# Patient Record
Sex: Male | Born: 1955 | Race: White | Hispanic: No | Marital: Married | Smoking: Never smoker
Health system: Southern US, Community
[De-identification: ages and names within clinical notes are randomized; demographics above are authoritative.]

## PROBLEM LIST (undated history)

## (undated) DIAGNOSIS — Z8679 Personal history of other diseases of the circulatory system: Secondary | ICD-10-CM

## (undated) DIAGNOSIS — G4730 Sleep apnea, unspecified: Secondary | ICD-10-CM

## (undated) DIAGNOSIS — IMO0001 Reserved for inherently not codable concepts without codable children: Secondary | ICD-10-CM

## (undated) DIAGNOSIS — I499 Cardiac arrhythmia, unspecified: Secondary | ICD-10-CM

## (undated) DIAGNOSIS — J309 Allergic rhinitis, unspecified: Secondary | ICD-10-CM

## (undated) DIAGNOSIS — I428 Other cardiomyopathies: Secondary | ICD-10-CM

## (undated) DIAGNOSIS — I4892 Unspecified atrial flutter: Secondary | ICD-10-CM

## (undated) DIAGNOSIS — I4819 Other persistent atrial fibrillation: Secondary | ICD-10-CM

## (undated) DIAGNOSIS — E669 Obesity, unspecified: Secondary | ICD-10-CM

## (undated) DIAGNOSIS — I5032 Chronic diastolic (congestive) heart failure: Secondary | ICD-10-CM

## (undated) DIAGNOSIS — I2510 Atherosclerotic heart disease of native coronary artery without angina pectoris: Secondary | ICD-10-CM

## (undated) DIAGNOSIS — Z9889 Other specified postprocedural states: Secondary | ICD-10-CM

## (undated) DIAGNOSIS — I10 Essential (primary) hypertension: Secondary | ICD-10-CM

## (undated) DIAGNOSIS — M1990 Unspecified osteoarthritis, unspecified site: Secondary | ICD-10-CM

## (undated) DIAGNOSIS — I484 Atypical atrial flutter: Secondary | ICD-10-CM

## (undated) DIAGNOSIS — I00 Rheumatic fever without heart involvement: Secondary | ICD-10-CM

## (undated) HISTORY — DX: Other persistent atrial fibrillation: I48.19

## (undated) HISTORY — DX: Other cardiomyopathies: I42.8

## (undated) HISTORY — DX: Obesity, unspecified: E66.9

## (undated) HISTORY — DX: Rheumatic fever without heart involvement: I00

## (undated) HISTORY — DX: Atherosclerotic heart disease of native coronary artery without angina pectoris: I25.10

## (undated) HISTORY — DX: Sleep apnea, unspecified: G47.30

## (undated) HISTORY — DX: Essential (primary) hypertension: I10

## (undated) HISTORY — DX: Atypical atrial flutter: I48.4

## (undated) HISTORY — PX: JOINT REPLACEMENT: SHX530

## (undated) HISTORY — DX: Unspecified atrial flutter: I48.92

## (undated) HISTORY — PX: TONSILLECTOMY: SUR1361

## (undated) HISTORY — DX: Allergic rhinitis, unspecified: J30.9

---

## 2001-05-29 ENCOUNTER — Inpatient Hospital Stay (HOSPITAL_COMMUNITY): Admission: AD | Admit: 2001-05-29 | Discharge: 2001-06-01 | Payer: Self-pay | Admitting: Internal Medicine

## 2001-05-29 ENCOUNTER — Encounter: Payer: Self-pay | Admitting: Internal Medicine

## 2001-05-31 ENCOUNTER — Encounter: Payer: Self-pay | Admitting: Cardiology

## 2001-08-29 ENCOUNTER — Ambulatory Visit (HOSPITAL_COMMUNITY): Admission: RE | Admit: 2001-08-29 | Discharge: 2001-08-29 | Payer: Self-pay | Admitting: Cardiology

## 2001-10-30 ENCOUNTER — Inpatient Hospital Stay (HOSPITAL_COMMUNITY): Admission: AD | Admit: 2001-10-30 | Discharge: 2001-11-02 | Payer: Self-pay | Admitting: Cardiology

## 2004-01-06 ENCOUNTER — Ambulatory Visit: Payer: Self-pay | Admitting: Cardiology

## 2004-01-15 ENCOUNTER — Ambulatory Visit: Payer: Self-pay

## 2004-01-15 ENCOUNTER — Ambulatory Visit: Payer: Self-pay | Admitting: Internal Medicine

## 2004-01-19 ENCOUNTER — Ambulatory Visit (HOSPITAL_BASED_OUTPATIENT_CLINIC_OR_DEPARTMENT_OTHER): Admission: RE | Admit: 2004-01-19 | Discharge: 2004-01-19 | Payer: Self-pay | Admitting: Cardiology

## 2004-01-19 ENCOUNTER — Ambulatory Visit: Payer: Self-pay | Admitting: Pulmonary Disease

## 2004-02-02 ENCOUNTER — Ambulatory Visit: Payer: Self-pay | Admitting: Cardiology

## 2004-02-16 ENCOUNTER — Ambulatory Visit: Payer: Self-pay | Admitting: Cardiology

## 2004-02-25 ENCOUNTER — Ambulatory Visit (HOSPITAL_COMMUNITY): Admission: RE | Admit: 2004-02-25 | Discharge: 2004-02-25 | Payer: Self-pay | Admitting: Cardiovascular Disease

## 2004-02-29 HISTORY — PX: ATRIAL ABLATION SURGERY: SHX560

## 2004-03-09 ENCOUNTER — Ambulatory Visit: Payer: Self-pay | Admitting: Cardiology

## 2004-03-19 ENCOUNTER — Ambulatory Visit: Payer: Self-pay | Admitting: Cardiology

## 2004-03-25 ENCOUNTER — Ambulatory Visit (HOSPITAL_COMMUNITY): Admission: RE | Admit: 2004-03-25 | Discharge: 2004-03-25 | Payer: Self-pay | Admitting: Cardiology

## 2004-04-01 ENCOUNTER — Ambulatory Visit: Payer: Self-pay | Admitting: Cardiology

## 2004-04-05 ENCOUNTER — Ambulatory Visit: Payer: Self-pay | Admitting: Cardiology

## 2004-04-13 ENCOUNTER — Ambulatory Visit: Payer: Self-pay

## 2004-04-26 ENCOUNTER — Ambulatory Visit: Payer: Self-pay | Admitting: Cardiology

## 2004-05-03 ENCOUNTER — Ambulatory Visit: Payer: Self-pay | Admitting: Cardiology

## 2004-05-03 ENCOUNTER — Emergency Department (HOSPITAL_COMMUNITY): Admission: EM | Admit: 2004-05-03 | Discharge: 2004-05-03 | Payer: Self-pay | Admitting: Emergency Medicine

## 2004-05-13 ENCOUNTER — Inpatient Hospital Stay (HOSPITAL_COMMUNITY): Admission: AD | Admit: 2004-05-13 | Discharge: 2004-05-14 | Payer: Self-pay | Admitting: Cardiology

## 2004-05-13 ENCOUNTER — Ambulatory Visit: Payer: Self-pay | Admitting: Cardiology

## 2004-05-19 ENCOUNTER — Ambulatory Visit: Payer: Self-pay | Admitting: Cardiology

## 2004-05-19 ENCOUNTER — Ambulatory Visit (HOSPITAL_COMMUNITY): Admission: RE | Admit: 2004-05-19 | Discharge: 2004-05-20 | Payer: Self-pay | Admitting: Internal Medicine

## 2004-06-15 ENCOUNTER — Ambulatory Visit: Payer: Self-pay | Admitting: Cardiology

## 2004-06-22 ENCOUNTER — Ambulatory Visit: Payer: Self-pay | Admitting: Internal Medicine

## 2004-07-27 ENCOUNTER — Ambulatory Visit: Payer: Self-pay | Admitting: Cardiology

## 2004-08-10 ENCOUNTER — Ambulatory Visit: Payer: Self-pay | Admitting: Internal Medicine

## 2004-09-10 ENCOUNTER — Ambulatory Visit: Payer: Self-pay | Admitting: Internal Medicine

## 2004-10-21 ENCOUNTER — Ambulatory Visit: Payer: Self-pay | Admitting: Cardiology

## 2004-11-18 ENCOUNTER — Ambulatory Visit: Payer: Self-pay | Admitting: Cardiology

## 2004-12-16 ENCOUNTER — Ambulatory Visit: Payer: Self-pay | Admitting: Internal Medicine

## 2004-12-16 ENCOUNTER — Ambulatory Visit: Payer: Self-pay | Admitting: Cardiology

## 2004-12-17 ENCOUNTER — Ambulatory Visit: Payer: Self-pay | Admitting: Family Medicine

## 2005-01-13 ENCOUNTER — Ambulatory Visit: Payer: Self-pay | Admitting: Cardiology

## 2005-02-16 ENCOUNTER — Ambulatory Visit: Payer: Self-pay | Admitting: Internal Medicine

## 2005-03-14 ENCOUNTER — Ambulatory Visit: Payer: Self-pay | Admitting: Cardiology

## 2005-04-11 ENCOUNTER — Ambulatory Visit: Payer: Self-pay | Admitting: Internal Medicine

## 2005-05-10 ENCOUNTER — Ambulatory Visit: Payer: Self-pay | Admitting: Cardiology

## 2005-06-21 ENCOUNTER — Ambulatory Visit: Payer: Self-pay | Admitting: Family Medicine

## 2005-08-02 ENCOUNTER — Ambulatory Visit: Payer: Self-pay | Admitting: Family Medicine

## 2005-08-30 ENCOUNTER — Ambulatory Visit: Payer: Self-pay | Admitting: Internal Medicine

## 2005-09-09 ENCOUNTER — Ambulatory Visit: Payer: Self-pay | Admitting: Internal Medicine

## 2005-09-13 ENCOUNTER — Ambulatory Visit: Payer: Self-pay | Admitting: Family Medicine

## 2005-09-14 ENCOUNTER — Ambulatory Visit: Payer: Self-pay | Admitting: *Deleted

## 2005-10-12 ENCOUNTER — Ambulatory Visit: Payer: Self-pay | Admitting: Internal Medicine

## 2005-10-19 ENCOUNTER — Ambulatory Visit: Payer: Self-pay | Admitting: Internal Medicine

## 2005-10-21 ENCOUNTER — Ambulatory Visit: Payer: Self-pay | Admitting: Internal Medicine

## 2005-11-24 ENCOUNTER — Ambulatory Visit: Payer: Self-pay | Admitting: Internal Medicine

## 2006-01-17 ENCOUNTER — Ambulatory Visit: Payer: Self-pay | Admitting: Internal Medicine

## 2006-02-10 ENCOUNTER — Ambulatory Visit: Payer: Self-pay | Admitting: Internal Medicine

## 2006-03-01 ENCOUNTER — Ambulatory Visit: Payer: Self-pay | Admitting: Internal Medicine

## 2006-03-17 ENCOUNTER — Ambulatory Visit: Payer: Self-pay | Admitting: Internal Medicine

## 2006-05-16 ENCOUNTER — Ambulatory Visit: Payer: Self-pay | Admitting: Internal Medicine

## 2006-05-22 ENCOUNTER — Ambulatory Visit: Payer: Self-pay | Admitting: Internal Medicine

## 2006-05-22 ENCOUNTER — Inpatient Hospital Stay (HOSPITAL_COMMUNITY): Admission: EM | Admit: 2006-05-22 | Discharge: 2006-05-26 | Payer: Self-pay | Admitting: *Deleted

## 2006-05-22 ENCOUNTER — Encounter (INDEPENDENT_AMBULATORY_CARE_PROVIDER_SITE_OTHER): Payer: Self-pay | Admitting: Interventional Cardiology

## 2006-05-30 ENCOUNTER — Encounter: Payer: Self-pay | Admitting: Internal Medicine

## 2006-05-30 ENCOUNTER — Ambulatory Visit: Payer: Self-pay | Admitting: Internal Medicine

## 2006-05-30 ENCOUNTER — Ambulatory Visit: Payer: Self-pay | Admitting: *Deleted

## 2006-05-30 DIAGNOSIS — Z8701 Personal history of pneumonia (recurrent): Secondary | ICD-10-CM | POA: Insufficient documentation

## 2006-05-30 DIAGNOSIS — R109 Unspecified abdominal pain: Secondary | ICD-10-CM

## 2006-05-30 DIAGNOSIS — R197 Diarrhea, unspecified: Secondary | ICD-10-CM | POA: Insufficient documentation

## 2006-05-30 LAB — CONVERTED CEMR LAB
Hemoglobin: 13.6 g/dL
INR: 5
Prothrombin Time: 27.2 s
Urobilinogen, UA: 0.2
WBC Urine, dipstick: NEGATIVE
pH: 5

## 2006-06-02 ENCOUNTER — Ambulatory Visit: Payer: Self-pay | Admitting: Internal Medicine

## 2006-06-02 LAB — CONVERTED CEMR LAB: INR: 1.5

## 2006-06-16 ENCOUNTER — Ambulatory Visit: Payer: Self-pay | Admitting: Internal Medicine

## 2006-06-16 LAB — CONVERTED CEMR LAB: Prothrombin Time: 18.3 s

## 2006-06-20 DIAGNOSIS — Z9089 Acquired absence of other organs: Secondary | ICD-10-CM | POA: Insufficient documentation

## 2006-06-20 DIAGNOSIS — Z8679 Personal history of other diseases of the circulatory system: Secondary | ICD-10-CM

## 2006-06-20 DIAGNOSIS — Z9889 Other specified postprocedural states: Secondary | ICD-10-CM

## 2006-06-20 DIAGNOSIS — I4891 Unspecified atrial fibrillation: Secondary | ICD-10-CM | POA: Insufficient documentation

## 2006-06-20 DIAGNOSIS — J309 Allergic rhinitis, unspecified: Secondary | ICD-10-CM | POA: Insufficient documentation

## 2006-06-20 DIAGNOSIS — Z9189 Other specified personal risk factors, not elsewhere classified: Secondary | ICD-10-CM | POA: Insufficient documentation

## 2006-07-05 ENCOUNTER — Ambulatory Visit: Payer: Self-pay | Admitting: Internal Medicine

## 2006-08-11 ENCOUNTER — Encounter: Admission: RE | Admit: 2006-08-11 | Discharge: 2006-08-11 | Payer: Self-pay | Admitting: Internal Medicine

## 2006-08-11 ENCOUNTER — Ambulatory Visit: Payer: Self-pay | Admitting: Internal Medicine

## 2006-08-25 ENCOUNTER — Ambulatory Visit: Payer: Self-pay | Admitting: Internal Medicine

## 2006-09-04 ENCOUNTER — Ambulatory Visit: Payer: Self-pay | Admitting: Internal Medicine

## 2006-10-09 ENCOUNTER — Ambulatory Visit: Payer: Self-pay | Admitting: Internal Medicine

## 2006-10-09 LAB — CONVERTED CEMR LAB
INR: 2
Prothrombin Time: 17.3 s

## 2006-12-07 ENCOUNTER — Ambulatory Visit: Payer: Self-pay | Admitting: Internal Medicine

## 2006-12-07 LAB — CONVERTED CEMR LAB: INR: 2.4

## 2007-01-22 ENCOUNTER — Ambulatory Visit: Payer: Self-pay | Admitting: Internal Medicine

## 2007-03-16 ENCOUNTER — Ambulatory Visit: Payer: Self-pay | Admitting: Internal Medicine

## 2007-03-16 LAB — CONVERTED CEMR LAB: INR: 2.1

## 2007-04-19 ENCOUNTER — Ambulatory Visit: Payer: Self-pay | Admitting: Internal Medicine

## 2007-04-19 LAB — CONVERTED CEMR LAB
Albumin: 3.6 g/dL (ref 3.5–5.2)
Bilirubin, Direct: 0.1 mg/dL (ref 0.0–0.3)
TSH: 2.94 microintl units/mL (ref 0.35–5.50)
Total Bilirubin: 1 mg/dL (ref 0.3–1.2)

## 2007-06-01 ENCOUNTER — Ambulatory Visit: Payer: Self-pay | Admitting: Internal Medicine

## 2007-06-01 LAB — CONVERTED CEMR LAB: INR: 2.2

## 2007-09-14 ENCOUNTER — Ambulatory Visit: Payer: Self-pay | Admitting: Internal Medicine

## 2007-09-14 LAB — CONVERTED CEMR LAB: INR: 3.4

## 2007-12-10 ENCOUNTER — Ambulatory Visit: Payer: Self-pay | Admitting: Internal Medicine

## 2007-12-10 LAB — CONVERTED CEMR LAB: Prothrombin Time: 13.6 s

## 2007-12-26 ENCOUNTER — Telehealth (INDEPENDENT_AMBULATORY_CARE_PROVIDER_SITE_OTHER): Payer: Self-pay | Admitting: *Deleted

## 2007-12-28 ENCOUNTER — Encounter: Payer: Self-pay | Admitting: Internal Medicine

## 2008-01-01 ENCOUNTER — Ambulatory Visit: Payer: Self-pay | Admitting: Internal Medicine

## 2008-01-01 LAB — CONVERTED CEMR LAB: Prothrombin Time: 19.5 s

## 2008-03-05 ENCOUNTER — Ambulatory Visit: Payer: Self-pay | Admitting: Internal Medicine

## 2008-03-05 LAB — CONVERTED CEMR LAB
Alkaline Phosphatase: 61 units/L (ref 39–117)
Bilirubin, Direct: 0.1 mg/dL (ref 0.0–0.3)
Prothrombin Time: 22.3 s — ABNORMAL HIGH (ref 10.9–13.3)
Total Bilirubin: 1.2 mg/dL (ref 0.3–1.2)
Total Protein: 6.6 g/dL (ref 6.0–8.3)

## 2008-04-14 ENCOUNTER — Ambulatory Visit: Payer: Self-pay | Admitting: Internal Medicine

## 2008-04-14 LAB — CONVERTED CEMR LAB
INR: 1.9
Prothrombin Time: 17.1 s

## 2008-07-04 ENCOUNTER — Ambulatory Visit: Payer: Self-pay | Admitting: Internal Medicine

## 2008-07-04 LAB — CONVERTED CEMR LAB: Prothrombin Time: 19.5 s

## 2008-09-24 ENCOUNTER — Ambulatory Visit: Payer: Self-pay | Admitting: Internal Medicine

## 2008-09-24 LAB — CONVERTED CEMR LAB
INR: 2.5
Prothrombin Time: 19.3 s

## 2008-12-18 ENCOUNTER — Ambulatory Visit: Payer: Self-pay | Admitting: Family Medicine

## 2008-12-30 ENCOUNTER — Encounter (INDEPENDENT_AMBULATORY_CARE_PROVIDER_SITE_OTHER): Payer: Self-pay | Admitting: *Deleted

## 2009-01-16 ENCOUNTER — Ambulatory Visit: Payer: Self-pay | Admitting: Internal Medicine

## 2009-01-16 ENCOUNTER — Telehealth (INDEPENDENT_AMBULATORY_CARE_PROVIDER_SITE_OTHER): Payer: Self-pay | Admitting: *Deleted

## 2009-01-16 LAB — CONVERTED CEMR LAB: INR: 3

## 2009-02-23 ENCOUNTER — Ambulatory Visit: Payer: Self-pay | Admitting: Internal Medicine

## 2009-02-23 LAB — CONVERTED CEMR LAB: Prothrombin Time: 14.2 s

## 2009-03-10 ENCOUNTER — Ambulatory Visit: Payer: Self-pay | Admitting: Family Medicine

## 2009-03-10 LAB — CONVERTED CEMR LAB
INR: 2.4
Prothrombin Time: 19 s

## 2009-03-11 ENCOUNTER — Telehealth: Payer: Self-pay | Admitting: Internal Medicine

## 2009-03-23 ENCOUNTER — Ambulatory Visit: Payer: Self-pay | Admitting: Internal Medicine

## 2009-03-23 DIAGNOSIS — I428 Other cardiomyopathies: Secondary | ICD-10-CM | POA: Insufficient documentation

## 2009-03-24 ENCOUNTER — Ambulatory Visit (HOSPITAL_COMMUNITY): Admission: RE | Admit: 2009-03-24 | Discharge: 2009-03-24 | Payer: Self-pay | Admitting: Internal Medicine

## 2009-03-24 ENCOUNTER — Ambulatory Visit: Payer: Self-pay

## 2009-03-24 ENCOUNTER — Ambulatory Visit: Payer: Self-pay | Admitting: Cardiovascular Disease

## 2009-03-24 ENCOUNTER — Encounter: Payer: Self-pay | Admitting: Internal Medicine

## 2009-03-31 ENCOUNTER — Telehealth (INDEPENDENT_AMBULATORY_CARE_PROVIDER_SITE_OTHER): Payer: Self-pay | Admitting: *Deleted

## 2009-04-08 ENCOUNTER — Encounter (INDEPENDENT_AMBULATORY_CARE_PROVIDER_SITE_OTHER): Payer: Self-pay | Admitting: *Deleted

## 2009-05-06 ENCOUNTER — Ambulatory Visit: Payer: Self-pay | Admitting: Internal Medicine

## 2009-05-06 ENCOUNTER — Telehealth (INDEPENDENT_AMBULATORY_CARE_PROVIDER_SITE_OTHER): Payer: Self-pay | Admitting: *Deleted

## 2009-05-06 LAB — CONVERTED CEMR LAB: INR: 2.7

## 2009-05-12 ENCOUNTER — Encounter (INDEPENDENT_AMBULATORY_CARE_PROVIDER_SITE_OTHER): Payer: Self-pay | Admitting: *Deleted

## 2009-07-10 ENCOUNTER — Ambulatory Visit: Payer: Self-pay | Admitting: Internal Medicine

## 2009-07-10 LAB — CONVERTED CEMR LAB: INR: 3.2

## 2009-07-28 ENCOUNTER — Telehealth (INDEPENDENT_AMBULATORY_CARE_PROVIDER_SITE_OTHER): Payer: Self-pay | Admitting: *Deleted

## 2009-07-31 ENCOUNTER — Ambulatory Visit: Payer: Self-pay | Admitting: Internal Medicine

## 2009-09-02 ENCOUNTER — Telehealth (INDEPENDENT_AMBULATORY_CARE_PROVIDER_SITE_OTHER): Payer: Self-pay | Admitting: *Deleted

## 2009-09-09 ENCOUNTER — Encounter (INDEPENDENT_AMBULATORY_CARE_PROVIDER_SITE_OTHER): Payer: Self-pay | Admitting: *Deleted

## 2009-10-09 ENCOUNTER — Ambulatory Visit: Payer: Self-pay | Admitting: Internal Medicine

## 2009-11-09 ENCOUNTER — Telehealth (INDEPENDENT_AMBULATORY_CARE_PROVIDER_SITE_OTHER): Payer: Self-pay | Admitting: *Deleted

## 2009-11-11 ENCOUNTER — Ambulatory Visit: Payer: Self-pay | Admitting: Internal Medicine

## 2009-11-11 LAB — CONVERTED CEMR LAB: INR: 2.6

## 2009-12-11 ENCOUNTER — Telehealth: Payer: Self-pay | Admitting: Internal Medicine

## 2009-12-18 ENCOUNTER — Ambulatory Visit: Payer: Self-pay | Admitting: Internal Medicine

## 2009-12-23 ENCOUNTER — Ambulatory Visit: Payer: Self-pay | Admitting: Internal Medicine

## 2009-12-25 ENCOUNTER — Ambulatory Visit: Payer: Self-pay | Admitting: Internal Medicine

## 2009-12-25 ENCOUNTER — Telehealth (INDEPENDENT_AMBULATORY_CARE_PROVIDER_SITE_OTHER): Payer: Self-pay | Admitting: *Deleted

## 2009-12-28 ENCOUNTER — Telehealth: Payer: Self-pay | Admitting: Internal Medicine

## 2010-01-04 ENCOUNTER — Ambulatory Visit: Payer: Self-pay | Admitting: Internal Medicine

## 2010-01-08 ENCOUNTER — Ambulatory Visit: Payer: Self-pay | Admitting: Internal Medicine

## 2010-01-15 ENCOUNTER — Ambulatory Visit: Payer: Self-pay | Admitting: Internal Medicine

## 2010-01-15 LAB — CONVERTED CEMR LAB: INR: 2.9

## 2010-01-20 ENCOUNTER — Ambulatory Visit: Payer: Self-pay | Admitting: Internal Medicine

## 2010-01-29 ENCOUNTER — Ambulatory Visit: Payer: Self-pay | Admitting: Internal Medicine

## 2010-02-05 ENCOUNTER — Ambulatory Visit: Payer: Self-pay | Admitting: Internal Medicine

## 2010-02-05 ENCOUNTER — Encounter: Payer: Self-pay | Admitting: Internal Medicine

## 2010-02-05 LAB — CONVERTED CEMR LAB
Basophils Absolute: 0 10*3/uL (ref 0.0–0.1)
Basophils Relative: 1 % (ref 0–1)
CO2: 27 meq/L (ref 19–32)
Calcium: 8.7 mg/dL (ref 8.4–10.5)
Creatinine, Ser: 1.28 mg/dL (ref 0.40–1.50)
MCHC: 34 g/dL (ref 30.0–36.0)
Monocytes Absolute: 0.9 10*3/uL (ref 0.1–1.0)
Neutro Abs: 5.2 10*3/uL (ref 1.7–7.7)
Neutrophils Relative %: 64 % (ref 43–77)
Platelets: 184 10*3/uL (ref 150–400)
RDW: 14.8 % (ref 11.5–15.5)
aPTT: 37 s (ref 24–37)

## 2010-02-11 ENCOUNTER — Ambulatory Visit (HOSPITAL_COMMUNITY)
Admission: RE | Admit: 2010-02-11 | Discharge: 2010-02-11 | Payer: Self-pay | Source: Home / Self Care | Attending: Cardiovascular Disease | Admitting: Cardiovascular Disease

## 2010-02-11 ENCOUNTER — Encounter: Payer: Self-pay | Admitting: Cardiovascular Disease

## 2010-02-12 ENCOUNTER — Ambulatory Visit (HOSPITAL_COMMUNITY)
Admission: RE | Admit: 2010-02-12 | Discharge: 2010-02-13 | Payer: Self-pay | Source: Home / Self Care | Attending: Internal Medicine | Admitting: Internal Medicine

## 2010-02-13 ENCOUNTER — Encounter: Payer: Self-pay | Admitting: Cardiology

## 2010-02-13 HISTORY — PX: OTHER SURGICAL HISTORY: SHX169

## 2010-02-19 ENCOUNTER — Ambulatory Visit: Payer: Self-pay | Admitting: Internal Medicine

## 2010-03-19 ENCOUNTER — Ambulatory Visit
Admission: RE | Admit: 2010-03-19 | Discharge: 2010-03-19 | Payer: Self-pay | Source: Home / Self Care | Attending: Pediatrics | Admitting: Pediatrics

## 2010-03-28 LAB — CONVERTED CEMR LAB
ALT: 71 units/L — ABNORMAL HIGH (ref 0–53)
AST: 56 units/L — ABNORMAL HIGH (ref 0–37)
Albumin: 3.6 g/dL (ref 3.5–5.2)
Alkaline Phosphatase: 59 units/L (ref 39–117)
Bilirubin, Direct: 0.1 mg/dL (ref 0.0–0.3)
TSH: 2.48 microintl units/mL (ref 0.35–5.50)
Total Bilirubin: 0.8 mg/dL (ref 0.3–1.2)
Total Protein: 6.8 g/dL (ref 6.0–8.3)

## 2010-04-01 NOTE — Progress Notes (Signed)
Summary: due INR   ---- Converted from flag ---- ---- 07/14/2009 8:38 AM, Shary Decamp wrote: due PT/INR ------------------------------ Marisue Ivan -- pls contact pt -- he is due for PT [facility]  Jul 28, 2009 2:14 PM

## 2010-04-01 NOTE — Progress Notes (Signed)
Summary: [name] 1/12  Phone Note Outgoing Call Call back at [facility] Phone [phone]   Summary of Call:  - See INR from yesterday, when do you want to recheck?   - pt was given a rx for cefprozil 10/10.  Patient is requesting a refill to "keep on hand" just in case he gets sick.  Patient works out of town a lot & it is hard for him to come to the office.  Typically, we do not rx abx like that but pt is requesting that we ask MD. Initial call taken by: Shary Decamp,  March 11, 2009 10:58 AM  Follow-up for Phone Call        if he feels sick  enough to need  antibiotics , he needs to be  seen, sorry . also she has not been seen in years for a CPX, is recommended that he has one Follow-up by: Braniyah Besse E. Aashish Hamm MD,  March 11, 2009 12:46 PM  Additional Follow-up for Phone Call Additional follow up Details #1::        Left message on machine for pt to return call Shary Decamp  March 11, 2009 4:36 PM  discussed with pt -- states that he has an appt with cardiologist 1/24 & they will check PT/INR @ that time  Additional Follow-up by: Shary Decamp,  March 12, 2009 10:18 AM

## 2010-04-01 NOTE — Assessment & Plan Note (Signed)
Summary: INR/KN      Allergies Added:  Nurse Visit   Vital Signs:  Patient profile:   55 year old male Height:      68 inches Weight:      202 pounds Temp:     97.8 degrees F oral Pulse rate:   58 / minute BP sitting:   120 / 72  (left arm)  Vitals Entered By: Jeremy Johann CMA (January 15, 2010 4:44 PM) CC: PT/INR   Current Medications (verified): 1)  Coumadin 5 Mg Tabs (Warfarin Sodium) .... As Directed 2)  Coreg 6.25 Mg Tabs (Carvedilol) .Marland Kitchen.. 1 By Mouth Bid 3)  Altace 5 Mg Caps (Ramipril) .... Take 1 Capsule By Mouth Two Times A Day 4)  Amiodarone Hcl 200 Mg Tabs (Amiodarone Hcl) .... Take  1 1/2 By Mouth Once Daily Tabs  Allergies (verified): 1)  * Penicillins Group 2)  Tessalon Perles (Benzonatate) Laboratory Results   Blood Tests    Date/Time Reported: January 15, 2010 4:46 PM   INR: 2.9   (Normal Range: 0.88-1.12   Therap INR: 2.0-3.5) Comments: 5MG  TABS: 1 TABS MWF, 1/2 TAB ALL OTHER DAYS.......Marland KitchenFelecia Deloach CMA  January 15, 2010 4:47 PM  PER DR LOWNE NO CHANGE. PT AWARE F/U NEXT WEEK PER DR Johney Frame

## 2010-04-01 NOTE — Assessment & Plan Note (Signed)
Summary: pt/cbs   Nurse Visit   Vital Signs:  Patient profile:   55 year old male Weight:      206.13 pounds Pulse rate:   106 / minute Pulse rhythm:   regular BP sitting:   120 / 74  (left arm) Cuff size:   regular  Vitals Entered By: Army Fossa CMA (October 09, 2009 3:22 PM) CC: PT/INR   Allergies: 1)  * Penicillins Group 2)  Tessalon Perles (Benzonatate) Laboratory Results   Blood Tests      INR: 2.3   (Normal Range: 0.88-1.12   Therap INR: 2.0-3.5) Comments: Has 5 mg tabs  Takes 5 mg m,w,f 1/2 tab all other days.  ------------ call patient : same dose  recheck in 4 week also tell him I'm concern about he not having his INR checked q 4 weeks. Strongly rec todo so ! he is also due for a CPX, schedule one if he is interested  Salem E. Nalleli Largent MD  October 11, 2009 10:21 AM   Left message for pt to call back Army Fossa [facility]  October 12, 2009 9:04 AM  Left detailed message for pt giving him instructions. Army Fossa CMA  October 12, 2009 1:21 PM     Orders Added: 1)  Est. Patient Level I [99211] 2)  Protime [16109UE]

## 2010-04-01 NOTE — Progress Notes (Signed)
Summary: INR  ---- Converted from flag ---- ---- 12/11/2009 8:37 AM, Khyrie Masi E. Marvine Encalade MD wrote: due for INR ------------------------------  Please schedule INR. Thanks! Army Fossa CMA  December 11, 2009 9:20 AM       Additional Follow-up for Phone Call Additional follow up Details #2::    LMOM TO CALL AND SCHEDULE A PT CHECK.Jerolyn Shin  December 15, 2009 11:26 AM  Patient just had it done, closed phone note. Lucious Groves CMA  December 21, 2009 3:09 PM

## 2010-04-01 NOTE — Letter (Signed)
Summary: DNA Testing Form/[employer]  DNA Testing Form/[employer]   Imported By: Lanelle Bal 02/12/2010 13:10:27  _____________________________________________________________________  External Attachment:    Type:   Image     Comment:   External Document

## 2010-04-01 NOTE — Letter (Signed)
Summary: Primary Care Appointment Letter  [facility] [place]  [address] [place], [place] [address]   Phone: [phone]  Fax: [phone]    04/08/2009 MRN: [id number]  [name] [address] [place], [place]  [address]  Dear Mr. MOLNER,   Your Primary Care Physician Finley E. Paz MD has indicated that:    ____x___it is time to schedule an appointment.Please call and schedule a physical.    _______you missed your appointment on______ and need to call and          reschedule.    _______you need to have lab work done.    _______you need to schedule an appointment discuss lab or test results.    _______you need to call to reschedule your appointment that is                       scheduled on _________.     Please call our office as soon as possible. Our phone number is [phone]_______. Please press option 1. Our office is open 8a-12noon and 1p-5p, Monday through Friday.     Thank you,    [facility]

## 2010-04-01 NOTE — Progress Notes (Signed)
Summary: 48 holter  Phone Note Outgoing Call Call back at Work Phone [phone]   Call placed by: Stanton Kidney, EMT-P,  December 25, 2009 9:21 AM Action Taken: Phone Call Completed Summary of Call: Patient scheduled 12/25/09 for 48 holter. Stanton Kidney, EMT-P  December 25, 2009 9:21 AM      Appended Document: 48 holter pt aware of results of monitor

## 2010-04-01 NOTE — Progress Notes (Signed)
Summary: INR due(lmom 7/6, 7/7, 7/11, 7/12)  ---- Converted from flag ---- ---- 09/02/2009 8:04 AM, Marti Sleigh Deloach CMA wrote:   ---- 08/03/2009 12:06 PM, Jose E. Paz MD wrote: due for an  INR ------------------------------ [[id number]] left message for pt to call back. Army Fossa, CMA September 02 2009  left message for pt to call back. Army Fossa CMA  September 03, 2009 1:01 PM  left message for pt to call back. Army Fossa CMA  September 07, 2009 1:44 PM  Left message on machine to call back to office. Lucious Groves  September 08, 2009 10:31 AM  [[id number]:[id number]]mailed pt a letter to contact office. Army Fossa CMA  September 09, 2009 10:24 AM

## 2010-04-01 NOTE — Assessment & Plan Note (Signed)
Summary: INR/KN (lmom 12/2)      Allergies Added:  Nurse Visit   Vital Signs:  Patient profile:   55 year old male Height:      68 inches Weight:      204 pounds BMI:     31.13 Pulse rate:   66 / minute Resp:     16 per minute BP sitting:   100 / 68  (left arm)  Vitals Entered By: Jeremy Johann CMA (January 29, 2010 3:55 PM) CC: PT/INR   Current Medications (verified): 1)  Coumadin 5 Mg Tabs (Warfarin Sodium) .... As Directed 2)  Coreg 6.25 Mg Tabs (Carvedilol) .Marland Kitchen.. 1 By Mouth Bid 3)  Altace 5 Mg Caps (Ramipril) .... Take 1 Capsule By Mouth Two Times A Day 4)  Amiodarone Hcl 200 Mg Tabs (Amiodarone Hcl) .... Take  1 1/2 By Mouth Once Daily Tabs  Allergies (verified): 1)  * Penicillins Group 2)  Tessalon Perles (Benzonatate) Laboratory Results   Blood Tests      INR: 2.2   (Normal Range: 0.88-1.12   Therap INR: 2.0-3.5) Comments: 1/2 tab (2.5mg ) daily except 1 tab (5mg ) M,W,F............Marland KitchenFelecia Deloach CMA  January 29, 2010 3:57 PM  this was early time to check his INR, nevertheless INR is therapeutic. Plan: No change Recheck in 4 weeks from today Please check on patient, BP was a slightly low. Was  he feeling well? Jose E. Paz MD  January 29, 2010 4:40 PM   left message for pt to call back. Army Fossa CMA  January 29, 2010 4:42 PM  Pt states that he was feeling fine, he states his BP runs low. Army Fossa CMA  February 01, 2010 8:34 AM     Orders Added: 1)  Est. Patient Level I [99211] 2)  Protime [16109UE]    ANTICOAGULATION RECORD PREVIOUS REGIMEN & LAB RESULTS Anticoagulation Diagnosis:  Atrial fibrillation on  03/10/2009 Previous INR Goal Range:  2-3 on  03/10/2009 Previous INR:  2.3 on  01/20/2010 Previous Coumadin Dose(mg):  5mg  MWF 2.5mg  all other days on  01/20/2010 Previous Regimen:  same on  03/10/2009 Previous Coagulation Comments:  Pt saw Dr. Ladona Ridgel they have done labs recently - that is why is has been more than 4 weeks  since last PT/INR check   on  06/01/2007  NEW REGIMEN & LAB RESULTS Current INR: 2.2 Regimen: 1/2 tab (2.5mg ) daily except 1 tab (5mg ) M,W,F  Provider: paz Repeat testing in: 1 week  Anticoagulation Visit Questionnaire Coumadin dose missed/changed:  No Abnormal Bleeding Symptoms:  No  Any diet changes including alcohol intake, vegetables or greens since the last visit:  No Any illnesses or hospitalizations since the last visit:  No Any signs of clotting since the last visit (including chest discomfort, dizziness, shortness of breath, arm tingling, slurred speech, swelling or redness in leg):  No  MEDICATIONS COUMADIN 5 MG TABS (WARFARIN SODIUM) as directed [BMN] COREG 6.25 MG TABS (CARVEDILOL) 1 by mouth bid ALTACE 5 MG CAPS (RAMIPRIL) Take 1 capsule by mouth two times a day AMIODARONE HCL 200 MG TABS (AMIODARONE HCL) Take  1 1/2 by mouth once daily tabs

## 2010-04-01 NOTE — Assessment & Plan Note (Signed)
Summary: pt check///sph   Nurse Visit   Vital Signs:  Patient profile:   55 year old male Height:      68 inches (172.72 cm) Weight:      205.38 pounds (93.35 kg) BMI:     31.34 Temp:     98.1 degrees F (36.72 degrees C) oral BP sitting:   100 / 60  (left arm) Cuff size:   regular  Vitals Entered By: Lucious Groves CMA (February 19, 2010 9:13 AM) CC: PT check./kb   Allergies: 1)  * Penicillins Group 2)  Tessalon Perles (Benzonatate) Laboratory Results   Blood Tests   Date/Time Received: Lucious Groves [facility]  February 19, 2010 9:13 AM  Date/Time Reported: Lucious Groves CMA  February 19, 2010 9:13 AM    INR: 1.9   (Normal Range: 0.88-1.12   Therap INR: 2.0-3.5) Comments: Patient notes that he has 5mg  tabs at home, he takes 5mg  on M,W,F and 2.5mg  all other days. Patient is aware I will call with MD recommendations. Lucious Groves CMA  February 19, 2010 9:14 AM  --------------------------------------- INR has been okay on the same dose for a while advise patient:  No change, recheck in 2 weeks Jose E. Paz MD  February 19, 2010 1:27 PM   Patient notified and notes he will call back for appt. Lucious Groves CMA  February 19, 2010 4:01 PM     Orders Added: 1)  Est. Patient Level I [99211] 2)  Protime [16109UE]

## 2010-04-01 NOTE — Assessment & Plan Note (Signed)
Summary: pt check//lch   Nurse Visit   Vital Signs:  Patient profile:   55 year old male Height:      68 inches (172.72 cm) Weight:      203.13 pounds (92.33 kg) BMI:     31.00 Temp:     97.8 degrees F (36.56 degrees C) oral BP sitting:   90 / 60  (left arm) Cuff size:   regular  Vitals Entered By: Lucious Groves CMA (March 19, 2010 3:57 PM)  Allergies: 1)  * Penicillins Group 2)  Tessalon Perles (Benzonatate) Laboratory Results   Blood Tests   Date/Time Received: Lucious Groves [facility]  March 19, 2010 3:55 PM  Date/Time Reported: Lucious Groves CMA  March 19, 2010 3:55 PM    INR: 3.1   (Normal Range: 0.88-1.12   Therap INR: 2.0-3.5) Comments: Patient notes that he takes 5mg  on M,W,F and 2.5mg  all other days. Per Hop: Patient will take 2.5mg  once daily EXCEPT M&F take 5mg . Re-check in two weeks. Lucious Groves CMA  March 19, 2010 3:56 PM     Orders Added: 1)  Est. Patient Level I [99211] 2)  Protime [04540JW]

## 2010-04-01 NOTE — Letter (Signed)
Summary: Primary Care Appointment Letter  [facility] [place]  [address] [place], [place] [address]   Phone: [phone]  Fax: [phone]    05/12/2009 MRN: [id number]  [name] [address] [place], [place]  [address]  Dear Mr. HINCAPIE,   Your Primary Care Physician Industry E. Paz MD has indicated that:    ___x____it is time to schedule an appointment. Due for a nurse visit.    _______you missed your appointment on______ and need to call and          reschedule.    _______you need to have lab work done.    _______you need to schedule an appointment discuss lab or test results.    _______you need to call to reschedule your appointment that is                       scheduled on _________.     Please call our office as soon as possible. Our phone number is [phone]_________. Please press option 1. Our office is open 8a-12noon and 1p-5p, Monday through Friday.     Thank you,    [facility]

## 2010-04-01 NOTE — Assessment & Plan Note (Signed)
Summary: rov    CC:  check up.  History of Present Illness: [name] returns today for followup of atrial fib.  When I last saw him he was in atrial fib but claimed that he was mostly in NSR.  I had him undergo a 2D echo and his EF was 40%.  He has class 1 CHF symptoms and very minimal palpitations.  The patient has done well in the interim denying c/p or sob or peripheral edema.  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) 2)  Coumadin Therapy  (ICD-V58.61) 3)  Encounter For Therapeutic Drug Monitoring  (ICD-V58.83) 4)  Cardiac Catheterization, Left, Hx of  (ICD-V15.2) 5)  Echocardiogram, Hx of  (ICD-V12.59) 6)  Bunionectomy, Hx of  (ICD-V15.89) 7)  Wisdom Teeth Extraction, Hx of  (ICD-V15.9) 8)  Tonsillectomy and Adenoidectomy, Hx of  (ICD-V45.79) 9)  Atrial Fibrillation  (ICD-427.31) 10)  Allergic Rhinitis  (ICD-477.9) 11)  Coumadin Therapy  (ICD-V58.61) 12)  Diarrhea  (ICD-787.91) 13)  Hx, Pneumonia  (ICD-V12.61) 14)  Flank Pain  (ICD-789.09)  Current Medications (verified): 1)  Coumadin 5 Mg Tabs (Warfarin Sodium) .... As Directed 2)  Coreg 6.25 Mg Tabs (Carvedilol) .Marland Kitchen.. 1 By Mouth Bid 3)  Altace 5 Mg Caps (Ramipril) .... Take 1 Capsule By Mouth Two Times A Day 4)  Amiodarone Hcl 200 Mg Tabs (Amiodarone Hcl) .... Take  1 1/2 By Mouth Once Daily Tabs  Allergies: 1)  * Penicillins Group 2)  Tessalon Perles (Benzonatate)  Past History:  Past Medical History: Last updated: 06/20/2006 Allergic rhinitis ABLATION  Family History: Last updated: 03/19/2009   Notable for mother dying of cancer and his father died  of cancer.  Social History: Last updated: 03/19/2009  The patient denies tobacco or ethanol use.  She is  married and lives with his wife in [place].  Review of Systems  The patient denies chest pain, syncope, dyspnea on exertion, and peripheral edema.    Vital Signs:  Patient profile:   55 year old male Height:      68  inches Weight:      201 pounds BMI:     30.67 Pulse rate:   104 / minute Resp:     14 per minute BP sitting:   127 / 88  (left arm)  Vitals Entered By: Kem Parkinson (December 23, 2009 3:22 PM)  Physical Exam  General:  Well developed, well nourished, in no acute distress.  HEENT: normal except for bluish facial discoloration secondary to amiodarone. Neck: supple. No JVD. Carotids 2+ bilaterally no bruits Cor: RRR no rubs, gallops or murmur Lungs: CTA Ab: soft, nontender. nondistended. No HSM. Good bowel sounds Ext: warm. no cyanosis, clubbing or edema Neuro: alert and oriented. Grossly nonfocal. affect pleasant    EKG  Procedure date:  12/23/2009  Findings:      Atrial fibrillation with an uncontrolled ventricular response rate of: 101.Right axis deviation.  Right bundle branch block.    Impression & Recommendations:  Problem # 1:  ATRIAL FIBRILLATION (ICD-427.31) The patient claims that he is mostly in atrial fib and has gone out of rhythm only 2-3 times since I last saw him.  He is still on fairly high dose amiodarone and I have asked him to undergo screening labs and CXR.  He is not interested in catheter ablation at this time. His updated medication list for this problem includes:    Coumadin 5 Mg Tabs (Warfarin sodium) .Marland Kitchen... As directed  Coreg 6.25 Mg Tabs (Carvedilol) .Marland Kitchen... 1 by mouth bid    Amiodarone Hcl 200 Mg Tabs (Amiodarone hcl) .Marland Kitchen... Take  1 1/2 by mouth once daily tabs  Orders: TLB-Hepatic/Liver Function Pnl (80076-HEPATIC) TLB-TSH (Thyroid Stimulating Hormone) (84443-TSH) T-2 View CXR (71020TC) Holter Monitor (Holter Monitor)  Problem # 2:  CARDIOMYOPATHY, DILATED (ICD-425.4) His CHF is class 1 and his EF is 40%.  He will continue his meds as below and I will ask him to obtain a 48 hour holter. His updated medication list for this problem includes:    Coumadin 5 Mg Tabs (Warfarin sodium) .Marland Kitchen... As directed    Coreg 6.25 Mg Tabs (Carvedilol) .Marland Kitchen... 1  by mouth bid    Altace 5 Mg Caps (Ramipril) .Marland Kitchen... Take 1 capsule by mouth two times a day    Amiodarone Hcl 200 Mg Tabs (Amiodarone hcl) .Marland Kitchen... Take  1 1/2 by mouth once daily tabs  Orders: TLB-Hepatic/Liver Function Pnl (80076-HEPATIC) TLB-TSH (Thyroid Stimulating Hormone) (84443-TSH) T-2 View CXR (71020TC) Holter Monitor (Holter Monitor)  Patient Instructions: 1)  Your physician recommends that you return for lab work today 2)  Your physician has recommended that you wear a holter monitor.  Holter monitors are medical devices that record the heart's electrical activity. Doctors most often use these monitors to diagnose arrhythmias. Arrhythmias are problems with the speed or rhythm of the heartbeat. The monitor is a small, portable device. You can wear one while you do your normal daily activities. This is usually used to diagnose what is causing palpitations/syncope (passing out). 3)  Your physician wants you to follow-up in:  12 months with Dr Court Joy will receive a reminder letter in the mail two months in advance. If you don't receive a letter, please call our office to schedule the follow-up appointment.

## 2010-04-01 NOTE — Progress Notes (Signed)
Summary: needs cpx  ---- Converted from flag ---- ---- 03/28/2009 2:05 PM, Jose E. Paz MD wrote: he needs a CPX  left message to return call to office.Marland KitchenMarland KitchenBarb Merino  April 02, 2009 9:36 AM  mailed a letter.Marland KitchenMarland KitchenMarland KitchenBarb Merino  April 08, 2009 9:22 AM

## 2010-04-01 NOTE — Progress Notes (Signed)
Summary: due nurse visit in 4 weeks  Phone Note Outgoing Call Call back at [facility] Phone [phone] Call back at Work Phone [phone]   Summary of Call: Please call pt & schedule a PT/INR check in 4 weeks Shary Decamp  May 06, 2009 3:12 PM     Additional Follow-up for Phone Call Additional follow up Details #2::    LMTCB.Marland KitchenMarland KitchenBarb Merino  May 06, 2009 3:16 PM  LMTCB.Marland KitchenMarland KitchenBarb Merino  May 11, 2009 11:39 AM  mailed a letter.Marland KitchenMarland KitchenMarland KitchenBarb Merino  May 12, 2009 9:06 AM  Follow-up by: Barb Merino,  May 06, 2009 3:16 PM

## 2010-04-01 NOTE — Progress Notes (Signed)
Summary: question - sugery   Phone Note Call from Patient Call back at Home Phone [phone]   Caller: Patient [phone] Reason for Call: Talk to Nurse Summary of Call: per pt calling, discuss question regarding surgery.  Initial call taken by: Lorne Skeens,  December 28, 2009 10:12 AM  Follow-up for Phone Call        pt called and states he brought the monitor back this morning.  After it is scanned and read by Dr Ladona Ridgel pt would like to go ahead and get in to see Dr Johney Frame to schedule an afib ablation Dennis Bast, RN, BSN  December 28, 2009 11:34 AM pt aware of results of monitor and will be set up to see Dr Johney Frame Dennis Bast, RN, BSN  December 28, 2009 6:13 PM

## 2010-04-01 NOTE — Procedures (Signed)
Summary: Summary Report  Summary Report   Imported By: Erle Crocker 01/13/2010 15:24:42  _____________________________________________________________________  External Attachment:    Type:   Image     Comment:   External Document

## 2010-04-01 NOTE — Assessment & Plan Note (Signed)
Summary: pt/cbs   Nurse Visit   Vital Signs:  Patient profile:   55 year old male Height:      68 inches Weight:      205 pounds Temp:     98.1 degrees F oral Pulse rate:   66 / minute BP sitting:   120 / 76  (left arm)  Vitals Entered By: Jeremy Johann CMA (July 31, 2009 10:37 AM) CC: pt check Comments REVIEWED MED LIST, PATIENT AGREED DOSE AND INSTRUCTION CORRECT    Allergies: 1)  * Penicillins Group 2)  Tessalon Perles (Benzonatate) Laboratory Results   Blood Tests    Date/Time Reported: July 31, 2009 10:38 AM   INR: 2.2   (Normal Range: 0.88-1.12   Therap INR: 2.0-3.5) Comments: current dose:5mg  tab 1 tab  M,W,F, 1/2 tab ALL OTHER DAYS...........................Marland KitchenFelecia Deloach CMA  July 31, 2009 10:39 AM  continue with same medicines, recheck in 4 weeks Sabien Umland E. Riko Lumsden MD  July 31, 2009 2:27 PM  left pt detail message RTO in 4 weeks..............Marland KitchenFelecia Deloach CMA  July 31, 2009 2:40 PM      Orders Added: 1)  Est. Patient Level I [56213] 2)  Protime [08657QI]

## 2010-04-01 NOTE — Assessment & Plan Note (Signed)
Summary: pt//lch   Nurse Visit   Vital Signs:  Patient profile:   55 year old male Height:      68 inches (172.72 cm) Weight:      200.13 pounds (90.97 kg) BMI:     30.54 Temp:     98.0 degrees F (36.67 degrees C) oral BP sitting:   114 / 60  (left arm) Cuff size:   large  Vitals Entered By: Lucious Groves CMA (December 18, 2009 3:29 PM) CC: PT check./kb Comments Patient has not taken today dose./kb   Allergies: 1)  * Penicillins Group 2)  Tessalon Perles (Benzonatate) Laboratory Results   Blood Tests   Date/Time Received: Lucious Groves [facility]  December 18, 2009 3:30 PM  Date/Time Reported: Lucious Groves CMA  December 18, 2009 3:30 PM    INR: 3.4   (Normal Range: 0.88-1.12   Therap INR: 2.0-3.5) Comments: Patient notes that he has not taken todays dose, but he takes 5mg  on M,W,F and 2.5mg  all  other days. Please advise. Lucious Groves CMA  December 18, 2009 3:30 PM  INR has been well-controlled with present dose for a while. Plan: no change, recheck in 3  weeks Jose E. Paz MD  December 18, 2009 4:52 PM  Left message on machine notifying patient and requesting that he to call back to office. Lucious Groves CMA  December 18, 2009 4:57 PM   Spoke with patient and confirmed that he did receive the message. Lucious Groves CMA  December 21, 2009 3:08 PM       ANTICOAGULATION RECORD PREVIOUS REGIMEN & LAB RESULTS Anticoagulation Diagnosis:  Atrial fibrillation on  03/10/2009 Previous INR Goal Range:  2-3 on  03/10/2009 Previous INR:  2.6 on  11/11/2009 Previous Coumadin Dose(mg):  5mg  M,W,F, 1/2 tab all other days  on  03/10/2009 Previous Regimen:  same on  03/10/2009 Previous Coagulation Comments:  Pt saw Dr. Ladona Ridgel they have done labs recently - that is why is has been more than 4 weeks since last PT/INR check   on  06/01/2007  NEW REGIMEN & LAB RESULTS Current INR: 3.4 Current Coumadin Dose(mg): 5mg  Regimen: same  (no change)   Anticoagulation Visit Questionnaire Coumadin dose  missed/changed:  No Abnormal Bleeding Symptoms:  No  Any diet changes including alcohol intake, vegetables or greens since the last visit:  No Any illnesses or hospitalizations since the last visit:  No Any signs of clotting since the last visit (including chest discomfort, dizziness, shortness of breath, arm tingling, slurred speech, swelling or redness in leg):  No  MEDICATIONS COUMADIN 5 MG TABS (WARFARIN SODIUM) as directed [BMN] COREG 6.25 MG TABS (CARVEDILOL) 1 by mouth bid ALTACE 5 MG CAPS (RAMIPRIL) Take 1 capsule by mouth two times a day AMIODARONE HCL 200 MG TABS (AMIODARONE HCL) Take  1 1/2 by mouth once daily tabs

## 2010-04-01 NOTE — Assessment & Plan Note (Signed)
Summary: pt check//ph   Nurse Visit   Allergies: 1)  * Penicillins Group 2)  Tessalon Perles (Benzonatate) Laboratory Results   Blood Tests     PT: 19.0 s   (Normal Range: 10.6-13.4)  INR: 2.4   (Normal Range: 0.88-1.12   Therap INR: 2.0-3.5) Comments: Stacia instructed pt she would call him tomorrow to let him know when he needed to come back in. Army Fossa CMA  March 10, 2009 10:36 AM    Orders Added: 1)  Est. Patient Level I [99211] 2)  Protime [16109UE]   ANTICOAGULATION RECORD PREVIOUS REGIMEN & LAB RESULTS Anticoagulation Diagnosis:  Atrial fibrillation on  06/01/2007  Previous INR:  1.3 on  02/23/2009 Previous Coumadin Dose(mg):  currently on 5mg  tabs 1/2 tab qd except 1 on M,W,F on  03/16/2007 Previous Regimen:  continue with 5mg  tabs 1/2 qd except 1 on M, W,F on  06/01/2007 Previous Coagulation Comments:  Pt saw Dr. Ladona Ridgel they have done labs recently - that is why is has been more than 4 weeks since last PT/INR check   on  06/01/2007  NEW REGIMEN & LAB RESULTS Anticoag. Dx: Atrial fibrillation Current INR Goal Range: 2-3 Current INR: 2.4 Current Coumadin Dose(mg): 5mg  M,W,F, 1/2 tab all other days  Regimen: same  Provider: Lowne Repeat testing in: 4 weeks

## 2010-04-01 NOTE — Assessment & Plan Note (Signed)
Summary: rov per pt call/lg      Allergies Added:   History of Present Illness: [name] returns today for followup.  He is a 55 yo man with a h/o persistent atrial fibrillation who returns for followup.  The patient notes that he goes in and out of atrial fibrillation.  He has been on fairly high dose amiodarone over the past several yrs.  He initially had atrial flutter with initiation of amiodarone and underwent flutter ablation.  The patient also had a tachycardia induced cardiomyopathy which had improved. He has class 1-2 CHF symptoms currently. No syncope.  Problems Prior to Update: 1)  Coumadin Therapy  (ICD-V58.61) 2)  Encounter For Therapeutic Drug Monitoring  (ICD-V58.83) 3)  Cardiac Catheterization, Left, Hx of  (ICD-V15.2) 4)  Echocardiogram, Hx of  (ICD-V12.59) 5)  Bunionectomy, Hx of  (ICD-V15.89) 6)  Wisdom Teeth Extraction, Hx of  (ICD-V15.9) 7)  Tonsillectomy and Adenoidectomy, Hx of  (ICD-V45.79) 8)  Ischemic Cardiomyopathy  (ICD-414.8) 9)  Atrial Fibrillation  (ICD-427.31) 10)  Allergic Rhinitis  (ICD-477.9) 11)  Coumadin Therapy  (ICD-V58.61) 12)  Diarrhea  (ICD-787.91) 13)  Hx, Pneumonia  (ICD-V12.61) 14)  Flank Pain  (ICD-789.09)  Current Medications (verified): 1)  Coumadin 5 Mg Tabs (Warfarin Sodium) .... As Directed 2)  Coreg 6.25 Mg Tabs (Carvedilol) .Marland Kitchen.. 1 By Mouth Bid 3)  Altace 5 Mg Caps (Ramipril) .... Take 1 Capsule By Mouth Two Times A Day 4)  Amiodarone Hcl 200 Mg Tabs (Amiodarone Hcl) .... Take  1 1/2 By Mouth Once Daily Tabs  Allergies (verified): 1)  * Penicillins Group 2)  Tessalon Perles (Benzonatate)  Review of Systems  The patient denies chest pain, syncope, dyspnea on exertion, and peripheral edema.    Vital Signs:  Patient profile:   55 year old male Height:      68 inches Weight:      204 pounds BMI:     31.13 Pulse rate:   115 / minute BP sitting:   124 / 80  (left arm) Cuff size:   regular  Vitals Entered By: Hardin Negus, RMA (March 23, 2009 3:00 PM)  Physical Exam  General:  Well developed, well nourished, in no acute distress.  HEENT: normal except for bluish facial discoloration secondary to amiodarone. Neck: supple. No JVD. Carotids 2+ bilaterally no bruits Cor: RRR no rubs, gallops or murmur Lungs: CTA Ab: soft, nontender. nondistended. No HSM. Good bowel sounds Ext: warm. no cyanosis, clubbing or edema Neuro: alert and oriented. Grossly nonfocal. affect pleasant    EKG  Procedure date:  03/23/2009  Findings:      Atrial fibrillation with an uncontrolled ventricular response rate of: 115.  Impression & Recommendations:  Problem # 1:  ATRIAL FIBRILLATION (ICD-427.31) I have discussed the treatment options with the patient.  He has been on fairly high dose amiodarone for several yrs. and despite this continues to have atrial fibrillation.  I am concerned about the possibility of worsening LV dysfunction and will plan a repeat 2D echo as he has not had one in several yrs.  I also have recommended that he considered an atrial fibrillation ablation.  He will call us if he desires to consider ablation and followup with Dr. Johney Frame. His updated medication list for this problem includes:    Coumadin 5 Mg Tabs (Warfarin sodium) .Marland Kitchen... As directed    Coreg 6.25 Mg Tabs (Carvedilol) .Marland Kitchen... 1 by mouth bid    Amiodarone Hcl 200 Mg Tabs (Amiodarone  hcl) ..... 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 As directed    Coreg 6.25 Mg Tabs (Carvedilol) .Marland Kitchen... 1 by mouth bid    Altace 5 Mg Caps (Ramipril) .Marland Kitchen... Take 1 capsule by mouth two times a day    Amiodarone Hcl 200  Mg Tabs (Amiodarone hcl) .Marland Kitchen... Take  1 1/2 by mouth once daily tabs  Problem # 3:  COUMADIN THERAPY (ICD-V58.61) He will continue on coumadin therapy for now.  Patient Instructions: 1)  Your physician has requested that you have an echocardiogram.  Echocardiography is a painless test that uses sound waves to create images of your heart. It provides your doctor with information about the size and shape of your heart and how well your heart's chambers and valves are working.  This procedure takes approximately one hour. There are no restrictions for this procedure. 2)  Your physician recommends that you schedule a follow-up appointment in: 6 months

## 2010-04-01 NOTE — Assessment & Plan Note (Signed)
Summary: pt check/kdc   Nurse Visit   Vital Signs:  Patient profile:   55 year old male Weight:      207 pounds Pulse rate:   64 / minute BP sitting:   110 / 80  Vitals Entered By: Kandice Hams (May 06, 2009 8:26 AM)  Allergies: 1)  * Penicillins Group 2)  Tessalon Perles (Benzonatate) Laboratory Results   Blood Tests      INR: 2.7   (Normal Range: 0.88-1.12   Therap INR: 2.0-3.5) Comments: current dose 5mg  m w f  and 1/2 other days  .Kandice Hams  May 06, 2009 8:27 AM no change, recheck in 4 weeks Jose E. Paz MD  May 06, 2009 2:4     Orders Added: 1)  Est. Patient Level I [[address]109] 2)  Protime [[id number]]

## 2010-04-01 NOTE — Progress Notes (Signed)
Summary: Pt/INR appt  ---- Converted from flag ---- ---- 10/11/2009 10:22 AM, Elita Quick E. Paz MD wrote: due for INR ------------------------------  Left message for pt to call back. Army Fossa CMA  November 09, 2009 4:58 PM  Left message for pt to call back. Army Fossa CMA  November 10, 2009 1:37 PM  Pt came in for an appt today. Army Fossa CMA  November 11, 2009 3:52 PM

## 2010-04-01 NOTE — Assessment & Plan Note (Signed)
Summary: PT/CBS   Nurse Visit   Allergies: 1)  * Penicillins Group 2)  Tessalon Perles (Benzonatate) Laboratory Results   Blood Tests      INR: 3.2   (Normal Range: 0.88-1.12   Therap INR: 2.0-3.5) Comments: CURRENT DOSE: 5MG  TABLETS -- 1/2 once daily except 1 tablet on M,W,F Patient has been out of town & has not been able to get here for PT/INR check.   Patient wants to know if ok to take 1/2 tab tonight instead of 1 tablet -- resume normal dose tomorrow......Marland Kitchenrecheck in 2 weeks?? Shary Decamp  Jul 10, 2009 1:11 PM take medication as usual, recheck in 2 weeks Akili Cuda E. Aprille Sawhney MD  Jul 13, 2009 10:53 AM  patient aware needs PT/INR in 2 weeks ....Marland KitchenMarland KitchenShary Decamp  Jul 14, 2009 8:37 AM     Orders Added: 1)  Protime [16109UE]

## 2010-04-01 NOTE — Assessment & Plan Note (Signed)
Summary: PT & FLU SHOT/CBS   Nurse Visit   Vital Signs:  Patient profile:   55 year old male Weight:      203 pounds Pulse rate:   49 / minute Pulse rhythm:   regular BP sitting:   124 / 80  (left arm) Cuff size:   regular  Vitals Entered By: Army Fossa CMA (November 11, 2009 3:27 PM) CC: PT/INR   Current Medications (verified): 1)  Coumadin 5 Mg Tabs (Warfarin Sodium) .... As Directed 2)  Coreg 6.25 Mg Tabs (Carvedilol) .Marland Kitchen.. 1 By Mouth Bid 3)  Altace 5 Mg Caps (Ramipril) .... Take 1 Capsule By Mouth Two Times A Day 4)  Amiodarone Hcl 200 Mg Tabs (Amiodarone Hcl) .... Take  1 1/2 By Mouth Once Daily Tabs  Allergies: 1)  * Penicillins Group 2)  Tessalon Perles (Benzonatate) Laboratory Results   Blood Tests      INR: 2.6   (Normal Range: 0.88-1.12   Therap INR: 2.0-3.5) Comments: 5 mg tabs 1 tab M,W,F 1/2 tabs all other days.  ------------------------------------ no change , 4 weeks  Malek Skog E. Nate Common MD  November 12, 2009 9:00 PM   Pt is aware. Army Fossa CMA  November 13, 2009 7:45 AM     Orders Added: 1)  Admin 1st Vaccine [90471] 2)  Flu Vaccine 30yrs + [90658] 3)  Est. Patient Level I [13244] 4)  Sedimentation Rate, non-automated [01027] Flu Vaccine Consent Questions     Do you have a history of severe allergic reactions to this vaccine? no    Any prior history of allergic reactions to egg and/or gelatin? no    Do you have a sensitivity to the preservative Thimersol? no    Do you have a past history of Guillan-Barre Syndrome? no    Do you currently have an acute febrile illness? no    Have you ever had a severe reaction to latex? no    Vaccine information given and explained to patient? yes    Are you currently pregnant? no    Lot Number:[id number]   Exp Date:08/28/2010   Site Given  Left Deltoid IM

## 2010-04-01 NOTE — Letter (Signed)
Summary: Primary Care Appointment Letter  [facility] [place]  [address] [place], [place] [address]   Phone: [phone]  Fax: [phone]    09/09/2009 MRN: [id number]  [name] [address] [place], [place]  [address]  Dear [name],   Your Primary Care Physician [place] E. Paz MD has indicated that:    ___X____it is time to schedule an appointment. (PT/INR)    _______you missed your appointment on______ and need to call and          reschedule.    _______you need to have lab work done.    _______you need to schedule an appointment discuss lab or test results.    _______you need to call to reschedule your appointment that is                       scheduled on _________.     Please call our office as soon as possible. Our phone number is [phone]          _________. Please press option 1. Our office is open 8a-12noon and 1p-5p, Monday through Friday.     Thank you,    [facility]uler

## 2010-04-01 NOTE — Assessment & Plan Note (Signed)
Summary: INR/KN      Allergies Added:  Nurse Visit   Vital Signs:  Patient profile:   55 year old male Weight:      202.50 pounds Pulse rate:   47 / minute Pulse rhythm:   regular BP sitting:   130 / 76  (left arm) Cuff size:   large  Vitals Entered By: Army Fossa CMA (January 08, 2010 3:28 PM) CC: PT check   Current Medications (verified): 1)  Coumadin 5 Mg Tabs (Warfarin Sodium) .... As Directed 2)  Coreg 6.25 Mg Tabs (Carvedilol) .Marland Kitchen.. 1 By Mouth Bid 3)  Altace 5 Mg Caps (Ramipril) .... Take 1 Capsule By Mouth Two Times A Day 4)  Amiodarone Hcl 200 Mg Tabs (Amiodarone Hcl) .... Take  1 1/2 By Mouth Once Daily Tabs  Allergies (verified): 1)  * Penicillins Group 2)  Tessalon Perles (Benzonatate) Laboratory Results   Blood Tests      INR: 2.3   (Normal Range: 0.88-1.12   Therap INR: 2.0-3.5) Comments: Has 5mg  tbas 1 tab M,W,F, 1/2 tab all other days.  ------------------------------------------------------- no change, 4 weeks  Jose E. Paz MD  January 11, 2010 5:16 PM   Pt aware. Army Fossa CMA  January 12, 2010 7:50 AM     Orders Added: 1)  Est. Patient Level I [99211] 2)  Protime [30865HQ]

## 2010-04-01 NOTE — Letter (Signed)
Summary: ELectrophysiology/Ablation Procedure Instructions  [facility], Main Office  [address]   [place], [place] [address]   Phone: [phone]  Fax: [phone]     Electrophysiology/Ablation Procedure Instructions    You are scheduled for a(n) afib ablation  on 02/12/10 at 7:30am with Dr. Johney Frame.  1.  Please come to the Short Stay Center at [facility] at 5:30am on the day of your procedure.  2.  Come prepared to stay overnight.   Please bring your insurance cards and a list of your medications.  3.  Come to Dr Leta Jungling office as scheduled  Patient has order  You do not have to be fasting.  4.  Do not have anything to eat or drink after midnight the night before your procedure.  5.   All of your medications may be taken with a small amount of water.     * Occasionally, EP studies and ablations can become lengthy.  Please make your family aware of this before your procedure starts.  Average time ranges from 2-8 hours for EP studies/ablations.  Your physician will locate your family after the procedure with the results.  * If you have any questions after you get home, please call the office at [phone].  [name]  TEE on 02/11/10 -- I will call you with a time  Appended Document: ELectrophysiology/Ablation Procedure Instructions pt aware of TEE sch at II:00 on 02/11/10  Check in at Short Stay at 10:00 NPO after midnight

## 2010-04-01 NOTE — Assessment & Plan Note (Signed)
Summary: ec6/-a- fib/ablation consult/mt    Visit Type:  Follow-up Referring Provider:  Dr Ladona Ridgel Primary Provider:  Nolon Rod. Paz MD   History of Present Illness: [name] is a pleasant 55 yo WM with a h/o persistent atrial fibrillation and atrial flutter s/p CTI ablation by Dr Ladona Ridgel 04/2004 who presents today for further evaluation of his atrial arrhythmias.  He reports initially being diagnosed with atrial flutter and atrial fibrillation in 2003 after presenting with a tachycardia mediated CM.  He was placed on Tikosyn and coumadin.  He did well for several years before having worsening afib.  He had a sleep study in 2005 which revealed only mild OSA for which he is not on cpap.  He was placed on amiodarone in 2006.   He underwent CTI ablation 04/2004 by Dr Ladona Ridgel.  Since that time, he reports increasing frequency and duration of atrial fibrillation.  He has required cardioversion on 4 separate occasions.  During atrial fibrillation he reports symptoms of fatigue and feels "washed out".  He also reports palpitations.  He takes 300mg  amiodarone daily but continues to have afib.  A recent holter monitor documented atrial flutter and atrial fibrillation.  He presents today for further atrial fibrillation.    Current Medications (verified): 1)  Coumadin 5 Mg Tabs (Warfarin Sodium) .... As Directed 2)  Coreg 6.25 Mg Tabs (Carvedilol) .Marland Kitchen.. 1 By Mouth Bid 3)  Altace 5 Mg Caps (Ramipril) .... Take 1 Capsule By Mouth Two Times A Day 4)  Amiodarone Hcl 200 Mg Tabs (Amiodarone Hcl) .... Take  1 1/2 By Mouth Once Daily Tabs  Allergies: 1)  * Penicillins Group 2)  Tessalon Perles (Benzonatate)  Past History:  Past Medical History: Persistent atrial fibrillation Atrial flutter s/p CTI ablation 2006 by Dr Ladona Ridgel MIld sleep apnea Nonischemic CM (EF 40%)  Past Surgical History: s/p CTI ablation 2006 Toe joint replacement tonsellectomy  Family History: Reviewed history from 03/19/2009 and no  changes required.   Notable for mother dying of cancer and his father died  of cancer.  Social History: He is married and lives with his wife in [place].  Denies TED.  Review of Systems       All systems are reviewed and negative except as listed in the HPI.   Vital Signs:  Patient profile:   55 year old male Height:      68 inches Weight:      200 pounds BMI:     30.52 Pulse rate:   51 / minute BP sitting:   122 / 64  (left arm)  Vitals Entered By: Laurance Flatten CMA (January 04, 2010 9:24 AM)  Physical Exam  General:  Well developed, well nourished, in no acute distress. Head:  normocephalic and atraumatic Eyes:  PERRLA/EOM intact; conjunctiva and lids normal. Mouth:  Teeth, gums and palate normal. Oral mucosa normal. Neck:  Neck supple, no JVD. No masses, thyromegaly or abnormal cervical nodes. Lungs:  Clear bilaterally to auscultation and percussion. Heart:  bradycardic regular rhythm, no m/r/g Abdomen:  Bowel sounds positive; abdomen soft and non-tender without masses, organomegaly, or hernias noted. No hepatosplenomegaly. Msk:  Back normal, normal gait. Muscle strength and tone normal. Pulses:  pulses normal in all 4 extremities Extremities:  No clubbing or cyanosis. Neurologic:  Alert and oriented x 3. Skin:  bluish tenting Cervical Nodes:  no significant adenopathy Psych:  Normal affect.   Cardiac Cath  Procedure date:  05/19/2004  Findings:  1.  Left ventricular ejection fraction is low normal to mildly decreased.      The patient is in sinus rhythm during the study.  2.  Shadowing is noted at the mouth of the left atrial appendage which looks      to be artifact from echodense structures in this area.  There is no      evidence of obvious large thrombus in the left atrial appendage.  3.  Left and right atrium are free of significant thrombus.  Interrogation      of the intra-atrial septum with color-flow Doppler shows no evidence of      obvious  intra-atrial communication.  There is some spontaneous echo      contrast noted in the right atrium associated with the peripheral IV      running during the procedure.  4.  The right atrial appendage shows no evidence of obvious thrombus.  5.  Trace mitral regurgitation and trace pulmonic regurgitation are noted.  6.  The aortic valve is trileaflet with grossly preserved cusp excursion and      no significant aortic regurgitation.  7.  Right ventricular contraction is within normal limits.  8.  Interrogation of the descending aorta shows mild atherosclerotic      plaquing with no obvious large thrombus.   CONCLUSIONS:  1.  Low normal to mildly decreased left ventricular ejection fraction in the      setting of normal sinus rhythm during the procedure.  2.  No obvious large intracardiac thrombus is noted in the left atrial      appendage, right atrial appendage, left atrium,      or right atrium.  3.  Trace mitral and pulmonic regurgitation.  4.  Mild atherosclerotic plaquing in the ascending aorta.   Sleep Study  Procedure date:  01/19/2004  Findings:      REFERRING PHYSICIAN:  Rollene Rotunda, M.D.   INDICATION FOR THIS STUDY:  Hypersomnia with sleep apnea.  Epsworth score is  4.   SLEEP ARCHITECTURE:  The patient had a total sleep time of 275 minutes with  decreased REM and slow way sleep.  Sleep onset latency was normal, however,  REM latency was somewhat prolonged.   IMPRESSION:  1.  Very mild obstructive sleep apnea/hypopnea syndrome with a respiratory      disturbance index of seven events per hour and O2 desaturation down to      87%.  Because of the small numbers of events, the patient did not meet      split night criteria.  2.  Moderate to loud snoring noted throughout the study.  3.  Atrial flutter/fibrillation with controlled ventricular response was      noted throughout.    Echocardiogram  Procedure date:  05/22/2006  Findings:        -  Limited  acoustic window availability. This was a technically         limited study.   -  The left ventricle was dilated. Overall left ventricular systolic         function was moderately to markedly decreased. Left         ventricular ejection fraction was estimated to be 35 % ,         range being 30 % to 40 %. Although no diagnostic left         ventricular regional wall motion abnormality was identified,         this possibility cannot be completely  excluded on the basis         of this study. Left ventricular wall thickness was mildly to         moderately increased.   -  The left atrium was mildly dilated.   -  Right ventricular size was at the upper limits of normal. Right         ventricular systolic function was reduced.   -  The right atrium was mildly dilated.  CXR  Procedure date:  05/11/2006  Findings:        Findings:  The heart is mildly enlarged but stable.  Mediastinal and   hilar contours are stable.  There is a left lower lobe infiltrate and   a small associated parapneumonic effusion.  The right lung is clear.   IMPRESSION:   Left lower lobe pneumonia and small parapneumonic effusion.   Renal US  Procedure date:  05/22/2006  Findings:       Clinical Data:    Pneumonia and shortness of breath, increased BUN   and creatinine.   PORTABLE RENAL/URINARY TRACT ULTRASOUND:   Technique:  Complete ultrasound examination of the urinary tract was   performed including evaluation of the kidneys, renal collecting   systems, and urinary bladder.   Findings:  Both kidneys have a normal size, shape and echotexture.   The right kidney measures 10.2 cm and the left kidney measures 12.5   cm.  No renal masses, hydronephrosis or shadowing calculi are seen   bilaterally.   IMPRESSION:   Normal renal ultrasound.       EKG  Procedure date:  01/04/2010  Findings:      sinus bradycardia 51 bpm, PR 188, QRS 104, Qtc 444, nonspecific ST/T changes  Echocardiogram  Procedure  date:  03/24/2009  Findings:      EF 40% with global HK LA size 51mm no Adam  Impression & Recommendations:  Problem # 1:  ATRIAL FIBRILLATION (ICD-427.31) The patient has symptomatic recurrent persistent atrial fibrillation.  He has failed medical therapy with tikosyn and amiodarone.  He is s/p CTI ablation by Dr Ladona Ridgel 2006, though recent holter monitor is suggestive of atrial flutter.  Therapeutic strategies for afib including medicine and ablation were discussed in detail with the patient today. Risk, benefits, and alternatives to EP study and radiofrequency ablation were also discussed in detail today. These risks include but are not limited to stroke, bleeding, vascular damage, tamponade, perforation, damage to the esophagus, lungs, and other structures, pulmonary vein stenosis, worsening renal function, and death. The patient understands these risk and wishes to proceed.  We will schedule ablation at the next available time.  He is aware that with a LA size of 51mm, anticipated success rates for ablation are reduced (likely 50-60%) and will likely require more than one ablation procedure.  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

## 2010-04-01 NOTE — Assessment & Plan Note (Signed)
Summary: INR/KN   Nurse Visit  CC: Pt check/kb   Allergies: 1)  * Penicillins Group 2)  Tessalon Perles (Benzonatate) Laboratory Results   Blood Tests   Date/Time Received: [name] [facility]  January 20, 2010 3:53 PM Date/Time Reported: [name] CMA  January 20, 2010 3:53 PM    INR: 2.3   (Normal Range: 0.88-1.12   Therap INR: 2.0-3.5) Comments: Patient notes that he has 5mg  tabs at home and takes 1 tab on MWF and 1/2 tab all other days. Patient is aware to continue the same and states that he has to do it weekly x4. [name] CMA  January 20, 2010 3:49 PM  same dose , next INR 4 weeks Jose E. Paz MD  January 23, 2010 11:01 AM     Orders Added: 1)  Est. Patient Level I [99211] 2)  Protime [[id number]]   ANTICOAGULATION RECORD PREVIOUS REGIMEN & LAB RESULTS Anticoagulation Diagnosis:  Atrial fibrillation on  03/10/2009 Previous INR Goal Range:  2-3 on  03/10/2009 Previous INR:  2.9 on  01/15/2010 Previous Coumadin Dose(mg):  5mg  on  12/18/2009 Previous Regimen:  same on  03/10/2009 Previous Coagulation Comments:  Pt saw Dr. Ladona Ridgel they have done labs recently - that is why is has been more than 4 weeks since last PT/INR check   on  06/01/2007  NEW REGIMEN & LAB RESULTS Current INR: 2.3 Current Coumadin Dose(mg): 5mg  MWF 2.5mg  all other days Regimen: same  (no change)   Anticoagulation Visit Questionnaire Coumadin dose missed/changed:  No Abnormal Bleeding Symptoms:  No  Any diet changes including alcohol intake, vegetables or greens since the last visit:  No Any illnesses or hospitalizations since the last visit:  No Any signs of clotting since the last visit (including chest discomfort, dizziness, shortness of breath, arm tingling, slurred speech, swelling or redness in leg):  No  MEDICATIONS COUMADIN 5 MG TABS (WARFARIN SODIUM) as directed [BMN] COREG 6.25 MG TABS (CARVEDILOL) 1 by mouth bid ALTACE 5 MG CAPS (RAMIPRIL) Take 1 capsule by mouth two times a  day AMIODARONE HCL 200 MG TABS (AMIODARONE HCL) Take  1 1/2 by mouth once daily tabs

## 2010-04-12 ENCOUNTER — Telehealth (INDEPENDENT_AMBULATORY_CARE_PROVIDER_SITE_OTHER): Payer: Self-pay | Admitting: *Deleted

## 2010-04-12 ENCOUNTER — Ambulatory Visit (INDEPENDENT_AMBULATORY_CARE_PROVIDER_SITE_OTHER): Payer: BC Managed Care – PPO | Admitting: Physician Assistant

## 2010-04-12 ENCOUNTER — Encounter: Payer: Self-pay | Admitting: Physician Assistant

## 2010-04-12 DIAGNOSIS — I4891 Unspecified atrial fibrillation: Secondary | ICD-10-CM

## 2010-04-21 NOTE — Assessment & Plan Note (Signed)
Summary: Post AFib Ablation  Medications Added COREG 12.5 MG TABS (CARVEDILOL) 1 by mouth two times a day AMIODARONE HCL 200 MG TABS (AMIODARONE HCL) 1 tab once daily ADVIL 200 MG TABS (IBUPROFEN) as needed      Allergies Added:   Referring Provider:  Dr Ladona Ridgel Primary Provider:  Nolon Rod. Paz MD   History of Present Illness: Primary Electrophysiologist:  Dr. Hillis Range   Mr Ahart is a pleasant 55 yo WM with a h/o persistent atrial fibrillation and atrial flutter s/p CTI ablation by Dr Ladona Ridgel 04/2004.  He was initially diagnosed with atrial flutter and atrial fibrillation in 2003 after presenting with a tachycardia mediated CM.  He was placed on Tikosyn and coumadin.  He did well for several years before having worsening afib.  He had a sleep study in 2005 which revealed only mild OSA for which he is not on cpap.  He was placed on amiodarone in 2006.   He underwent CTI ablation 04/2004 by Dr Ladona Ridgel.  Since that time, he had increasing frequency and duration of atrial fibrillation on amiodarone 300 mg daily.  He  required cardioversion on 4 separate occasions.  During atrial fibrillation he reports symptoms of fatigue and feels "washed out".  He also reports palpitations.  He was recently referred for afib ablation.  He was admitted 12/16 and underwent ablation for his AFib.  He had no inducible arrythmias post ablation and DCCV.  Of note, he had an elevated Hgb, but follow up EPO level was normal.  He returns for follow up.  He was having AFib on and off for about 4-5 weeks after his ablation.  Now, over the last 3 weeks he has not noticed any palpitations.   He denies fatigue, chest pain, dyspnea or syncope.   Current Medications (verified): 1)  Coumadin 5 Mg Tabs (Warfarin Sodium) .... As Directed 2)  Coreg 12.5 Mg Tabs (Carvedilol) .Marland Kitchen.. 1 By Mouth Two Times A Day 3)  Altace 5 Mg Caps (Ramipril) .... Take 1 Capsule By Mouth Two Times A Day 4)  Amiodarone Hcl 200 Mg Tabs (Amiodarone Hcl)  .... Take  1 1/2 By Mouth Once Daily Tabs 5)  Advil 200 Mg Tabs (Ibuprofen) .... As Needed  Allergies (verified): 1)  * Penicillins Group 2)  Tessalon Perles (Benzonatate)  Past History:  Past Medical History: Persistent atrial fibrillation Atrial flutter s/p CTI ablation 2006 by Dr Ladona Ridgel MIld sleep apnea Nonischemic CM (EF 40%) Echo 01/2010: EF 45-50% Amio induced blue skin  Review of Systems       As per  the HPI.  All other systems reviewed and negative.   Vital Signs:  Patient profile:   55 year old male Height:      68 inches Weight:      199 pounds BMI:     30.37 Pulse rate:   42 / minute Resp:     16 per minute BP sitting:   120 / 76  (right arm)  Vitals Entered By: Marrion Coy, CNA (April 12, 2010 3:12 PM)  Physical Exam  General:  Well nourished, well developed, in no acute distress HEENT: normal Neck: no JVD Cardiac:  normal S1, S2; RRR; no murmur Lungs:  clear to auscultation bilaterally, no wheezing, rhonchi or rales Abd: soft, nontender, no hepatomegaly Ext: no edema; bilat femoral vein sites stable without hematoma or bruit Skin: warm and dry Neuro:  CNs 2-12 intact, no focal abnormalities noted    EKG  Procedure date:  04/12/2010  Findings:      marked sinus bradycardia Heart rate 42 Left axis deviation T-wave inversions in leads V1-V6 and 3, aVF No significant change from previous tracing  Impression & Recommendations:  Problem # 1:  ATRIAL FIBRILLATION (ICD-427.31) Doing well post ablation.  Dr. Johney Frame also has seen the patient.  Decrease Amiodarone to 200 mg once daily. Follow up with Dr. Johney Frame in 4 weeks.  Orders: EKG w/ Interpretation (93000)  Patient Instructions: 1)  Your physician recommends that you schedule a follow-up appointment in:  1 month f/u appt with Dr. Johney Frame... 2)  Your physician has recommended you make the following change in your medication:  Decrease Amiodarone to 200 mg 1 tablet daily.

## 2010-04-21 NOTE — Medication Information (Signed)
Summary: Physician's Orders   Physician's Orders   Imported By: Roderic Ovens 04/14/2010 11:02:19  _____________________________________________________________________  External Attachment:    Type:   Image     Comment:   External Document

## 2010-04-22 ENCOUNTER — Ambulatory Visit (INDEPENDENT_AMBULATORY_CARE_PROVIDER_SITE_OTHER): Payer: BC Managed Care – PPO

## 2010-04-22 ENCOUNTER — Encounter: Payer: Self-pay | Admitting: Internal Medicine

## 2010-04-22 DIAGNOSIS — I4891 Unspecified atrial fibrillation: Secondary | ICD-10-CM

## 2010-04-22 DIAGNOSIS — Z5181 Encounter for therapeutic drug level monitoring: Secondary | ICD-10-CM

## 2010-04-22 DIAGNOSIS — Z7901 Long term (current) use of anticoagulants: Secondary | ICD-10-CM

## 2010-04-22 LAB — CONVERTED CEMR LAB: INR: 3.8

## 2010-04-27 NOTE — Progress Notes (Addendum)
Summary: PT Check---lmom  2/16  Phone Note Outgoing Call   Call placed by: Army Fossa CMA,  April 12, 2010 8:18 AM Summary of Call: Due to have PT checked- please schedule. Army Fossa CMA  April 12, 2010 8:18 AM   Follow-up for Phone Call        [facility] to call to schedu;e PT visit.Jerolyn Shin  April 15, 2010 4:15 PM

## 2010-04-29 NOTE — Discharge Summary (Signed)
[name], ENGEN               ACCOUNT NO.:  [id number]  MEDICAL RECORD NO.:  [id number]          PATIENT TYPE:  OIB  LOCATION:  2919                         FACILITY:  [facility]  PHYSICIAN:  Learta Codding, MD,FACC DATE OF BIRTH:  07-Jan-1956  DATE OF ADMISSION:  02/12/2010 DATE OF DISCHARGE:  02/13/2010                              DISCHARGE SUMMARY   PRIMARY CARDIOLOGIST:  Dr. Drue Novel.  ELECTROPHYSIOLOGIST:  Hillis Range, MD  DISCHARGE DIAGNOSES: 1. Persistent atrial fibrillation, status post radiofrequency ablation     for supraventricular tachycardia.     a.     Status post direct current cardioversion with conversion to      normal sinus rhythm.  The patient is currently in atrial      fibrillation. 2. 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 3. Mild sleep apnea. 4. 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 5. Erythrocytosis, currently within normal limits.  ALLERGIES:  PENICILLIN. PROCEDURES PERFORMED DURING HOSPITALIZATION:  Comprehensive electrophysiology study, coronary sinus pacing and recording and 3-D mapping of SVT.  Radiofrequency ablation for SVT.  Successful cardioversion to sinus rhythm.  No inducible arrhythmias following ablation.  HISTORY OF PRESENT ILLNESS:  This is a 55 year old gentleman with history of symptomatic persistent atrial fibrillation, who has undergone cavotricuspid isthmus ablation by Dr. Ladona Ridgel in 2006.  Unfortunately, he has subsequently developed atrial fibrillation and has failed medical therapy with multiple medications.  These medications include Tikosyn and amiodarone.  Therefore, the patient has presented for EP study and radiofrequency ablation by Dr. Johney Frame.  HOSPITAL COURSE:  The patient was taken to the Electrophysiology Lab by Dr. Johney Frame.  Informed consent was obtained.  Upon presentation, the patient was in atrial fibrillation with transient spontaneous sinus rhythm.  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  Complex fractionated atrial electrogram were identified and ablated on the roof of the left atrium, base of the left atrial appendage, lateral wall of the left atrium, interatrial septum and was in the coronary sinus.  The superior vena cava was also encircled with radiofrequency current.  There was then successful direct current cardioversion to sinus rhythm.  There is no inducible arrhythmias post ablation.  The patient tolerated the procedure well. He did maintain sinus rhythm throughout the night.  On the morning of discharge, the patient was noted to be back in atrial fibrillation.  The patient will be discharged today with outpatient followup by Dr. Johney Frame. His Coreg will be increased to 12.5 mg twice daily for better rate control.  Of note, the patient was noted to have erythrocytosis with a hemoglobin of 17.4 and hematocrit of 52.5.  This was questionable etiology if this was secondary or primary condition.  Followup CBC prior to discharge showed hemoglobin of 15.5 and hematocrit of 46.7.  Erythropoietin level was also drawn, and this is still pending at discharge.  It is noted that if this is decreased, the patient will need further evaluation. Again, this will be followed up as an outpatient.  With history of being on amiodarone, he will also need followup PFTs and DLCO with outpatient chest x-ray.  This will be set up as an outpatient.  On the day of discharge, Dr. Andee Lineman evaluated the patient and noted him stable for home.  He is currently in atrial fibrillation, but without complaints of chest pain or shortness of breath or palpitations. Discharge medications and changes have been discussed with the patient, and he voices understanding.  DISCHARGE LABS:  WBC of 12.7, hemoglobin 15.5, hematocrit 46.7, platelets 137, INR of 2.45.  DISCHARGE MEDICATIONS: 1. Carvedilol 12.5 mg p.o. twice daily. 2. Advil  200 mg every 8 hours as needed for headaches. 3. Altace 5 mg twice daily. 4. Amiodarone 300 mg daily. 5. Coumadin 5 mg as directed.  FOLLOWUP PLAN AND PROCEDURES: 1. The patient will follow up with Dr. Johney Frame in 8 weeks.  The office     will call to schedule this appointment. 2. The patient will follow up in our Coumadin Clinic in 1 week for INR     draws. 3. The patient will follow up with Dr. Drue Novel for outpatient PFTs and     DLCO as well as chest x-ray, the office will call to schedule this     appointment. 4. The patient is to increase activity as tolerated. 5. The patient is to call the office for any concerns prior to his     appointment.  LABORATORY DATA:  Pending erythropoietin.  Duration of discharge is greater than 30 minutes with physician and physician extender time.     Adam Monarch, PA-C   ______________________________ Learta Codding, MD,FACC    NB/MEDQ  D:  02/13/2010  T:  02/14/2010  Job:  [id number]  cc:   Dr. Lennice Sites, MD  Electronically Signed by Alen Blew P.A. on 03/09/2010 07:44:20 PM Electronically Signed by Lewayne Bunting MDFACC on 04/29/2010 10:22:56 AM

## 2010-05-06 NOTE — Assessment & Plan Note (Signed)
Summary: pt check   Nurse Visit   Vital Signs:  Patient profile:   55 year old male Height:      68 inches Weight:      202 pounds Temp:     98.1 degrees F oral Pulse rate:   84 / minute BP sitting:   122 / 100  (left arm)  Vitals Entered By: Jeremy Johann CMA (April 22, 2010 4:09 PM) CC: PT/INR   Allergies: 1)  * Penicillins Group 2)  Tessalon Perles (Benzonatate) Laboratory Results   Blood Tests      INR: 3.8   (Normal Range: 0.88-1.12   Therap INR: 2.0-3.5) Comments: (5mg ) 1 tab  M,W,F and (2.5mg ) 1/2 tab all other days. At last OV Pt was advise to take 2.5mg  once daily EXCEPT M&F take 5mg . Pt did not make change continue with current dose instead. Pt awaiting call ok to leave message....Marland KitchenMarland KitchenFelecia Deloach CMA  April 22, 2010 4:10 PM   current Coumadin dose is 25 milligrams weekly. INR is elevated for the second time. Plan:  Change Coumadin 5 mg to-----half tablet daily and one tablet on Tuesday    total dose has been decreased to 20mg .  INR in 2 weeks Chrishonda Hesch E. [place] Canty MD  April 22, 2010 5:03 PM   Left message for pt to call back. Army Fossa CMA  April 26, 2010 11:03 AM  Left detailed message for pt informing pt of this, asked pt to call back to verify he got this message. Army Fossa CMA  April 22, 2010 5:06 PM  Pts wife will have him call us back to confirm he got message. Army Fossa CMA  April 23, 2010 11:51 AM  Pt received messages. Army Fossa CMA  April 26, 2010 1:12 PM      Orders Added: 1)  Est. Patient Level I [99211] 2)  Protime [[id number]]    ANTICOAGULATION RECORD PREVIOUS REGIMEN & LAB RESULTS Anticoagulation Diagnosis:  Atrial fibrillation on  03/10/2009 Previous INR Goal Range:  2-3 on  03/10/2009 Previous INR:  3.1 on  03/19/2010 Previous Coumadin Dose(mg):  5mg  MWF 2.5mg  all other days on  01/20/2010 Previous Regimen:  1/2 tab (2.5mg ) daily except 1 tab (5mg ) M,W,F on  01/29/2010 Previous Coagulation  Comments:  Pt saw Dr. Ladona Ridgel they have done labs recently - that is why is has been more than 4 weeks since last PT/INR check   on  06/01/2007  NEW REGIMEN & LAB RESULTS Current INR: 3.8 Current Coumadin Dose(mg): 1/2 tab (2.5mg ) daily except 1 tab (5mg ) M,W,F Regimen: 1/2 tab (2.5mg ) daily except 1 tab (5mg ) M,W,F  (no change)  Provider: Woodie Degraffenreid  Anticoagulation Visit Questionnaire Coumadin dose missed/changed:  No Abnormal Bleeding Symptoms:  No  Any diet changes including alcohol intake, vegetables or greens since the last visit:  No Any illnesses or hospitalizations since the last visit:  No Any signs of clotting since the last visit (including chest discomfort, dizziness, shortness of breath, arm tingling, slurred speech, swelling or redness in leg):  No  MEDICATIONS COUMADIN 5 MG TABS (WARFARIN SODIUM) as directed [BMN] COREG 12.5 MG TABS (CARVEDILOL) 1 by mouth two times a day ALTACE 5 MG CAPS (RAMIPRIL) Take 1 capsule by mouth two times a day AMIODARONE HCL 200 MG TABS (AMIODARONE HCL) 1 tab once daily ADVIL 200 MG TABS (IBUPROFEN) as needed

## 2010-05-07 ENCOUNTER — Encounter: Payer: Self-pay | Admitting: Internal Medicine

## 2010-05-07 ENCOUNTER — Ambulatory Visit (INDEPENDENT_AMBULATORY_CARE_PROVIDER_SITE_OTHER): Payer: BC Managed Care – PPO

## 2010-05-07 DIAGNOSIS — I4891 Unspecified atrial fibrillation: Secondary | ICD-10-CM

## 2010-05-07 DIAGNOSIS — Z5181 Encounter for therapeutic drug level monitoring: Secondary | ICD-10-CM

## 2010-05-07 DIAGNOSIS — Z7901 Long term (current) use of anticoagulants: Secondary | ICD-10-CM

## 2010-05-07 LAB — CONVERTED CEMR LAB: POC INR: 2.4

## 2010-05-10 LAB — DIFFERENTIAL
Lymphocytes Relative: 8 % — ABNORMAL LOW (ref 12–46)
Lymphs Abs: 1 10*3/uL (ref 0.7–4.0)
Monocytes Relative: 11 % (ref 3–12)
Neutro Abs: 10.3 10*3/uL — ABNORMAL HIGH (ref 1.7–7.7)
Neutrophils Relative %: 81 % — ABNORMAL HIGH (ref 43–77)

## 2010-05-10 LAB — BASIC METABOLIC PANEL
CO2: 26 mEq/L (ref 19–32)
Calcium: 8.9 mg/dL (ref 8.4–10.5)
Glucose, Bld: 100 mg/dL — ABNORMAL HIGH (ref 70–99)
Sodium: 142 mEq/L (ref 135–145)

## 2010-05-10 LAB — CBC
Hemoglobin: 15.5 g/dL (ref 13.0–17.0)
Hemoglobin: 17.4 g/dL — ABNORMAL HIGH (ref 13.0–17.0)
MCH: 30.5 pg (ref 26.0–34.0)
MCHC: 33.1 g/dL (ref 30.0–36.0)
RBC: 5.04 MIL/uL (ref 4.22–5.81)

## 2010-05-10 LAB — PROTIME-INR
INR: 2.45 — ABNORMAL HIGH (ref 0.00–1.49)
Prothrombin Time: 22.8 seconds — ABNORMAL HIGH (ref 11.6–15.2)
Prothrombin Time: 26.7 seconds — ABNORMAL HIGH (ref 11.6–15.2)

## 2010-05-11 NOTE — Medication Information (Signed)
Summary: pt/cbs   PCP: Nolon Rod. Kymberley Raz MD Indication 1: Atrial fibrillation INR POC 2.4 INR RANGE 2-3  Vital Signs: Weight: 202 lbs.  Blood Pressure:  116 / 78   Dietary changes: no    Health status changes: no    Bleeding/hemorrhagic complications: no    Recent/future hospitalizations: no    Any changes in medication regimen? no    Recent/future dental: no  Any missed doses?: no       Is patient compliant with meds? yes      Comments: current dose: 2.5mg  daily except 5mg  on wed.   no change per Dr. Drue Novel recheck in 2 weeks informed patient of instructions says he will be out of town in 2 weeks so will come back in 3 weeks .Marland KitchenMarland KitchenMarland KitchenDoristine Devoid CMA  May 07, 2010 3:39 PM    Allergies: 1)  * Penicillins Group 2)  Tessalon Perles (Benzonatate)  Anticoagulation Management History:      Negative risk factors for bleeding include an age less than 69 years old.  The bleeding index is 'low risk'.  Negative CHADS2 values include Age > 31 years old.  His last INR was 3.8.  INR POC: 2.4.    Anticoagulation Management Assessment/Plan:      The patient's current anticoagulation dose is Coumadin 5 mg tabs: as directed.  The next INR is due 1 week.

## 2010-05-19 ENCOUNTER — Encounter: Payer: Self-pay | Admitting: Internal Medicine

## 2010-05-28 ENCOUNTER — Ambulatory Visit (INDEPENDENT_AMBULATORY_CARE_PROVIDER_SITE_OTHER): Payer: BC Managed Care – PPO | Admitting: *Deleted

## 2010-05-28 DIAGNOSIS — I4891 Unspecified atrial fibrillation: Secondary | ICD-10-CM

## 2010-05-28 DIAGNOSIS — Z7901 Long term (current) use of anticoagulants: Secondary | ICD-10-CM

## 2010-05-28 LAB — POCT INR: INR: 1.7

## 2010-05-28 NOTE — Patient Instructions (Addendum)
New Regimen- 5mg  M,W,F then 2.5mg  all other days --------------------------------------------------------- Pt request to go back to previous dose (see above) which worked well for him x years. I agreed, will came back in 2 weeks [facility]

## 2010-05-31 ENCOUNTER — Encounter: Payer: Self-pay | Admitting: Internal Medicine

## 2010-05-31 ENCOUNTER — Ambulatory Visit (INDEPENDENT_AMBULATORY_CARE_PROVIDER_SITE_OTHER): Payer: BC Managed Care – PPO | Admitting: Internal Medicine

## 2010-05-31 VITALS — BP 132/80 | HR 46 | Ht 67.0 in | Wt 199.0 lb

## 2010-05-31 DIAGNOSIS — I4891 Unspecified atrial fibrillation: Secondary | ICD-10-CM

## 2010-05-31 DIAGNOSIS — I428 Other cardiomyopathies: Secondary | ICD-10-CM

## 2010-05-31 LAB — CBC WITH DIFFERENTIAL/PLATELET
Basophils Absolute: 0 10*3/uL (ref 0.0–0.1)
Basophils Relative: 0.6 % (ref 0.0–3.0)
Hemoglobin: 16.4 g/dL (ref 13.0–17.0)
Lymphocytes Relative: 18.5 % (ref 12.0–46.0)
Monocytes Relative: 10 % (ref 3.0–12.0)
Neutro Abs: 4.4 10*3/uL (ref 1.4–7.7)
Neutrophils Relative %: 68.9 % (ref 43.0–77.0)
RBC: 5.25 Mil/uL (ref 4.22–5.81)
RDW: 15.7 % — ABNORMAL HIGH (ref 11.5–14.6)

## 2010-05-31 LAB — BASIC METABOLIC PANEL
Chloride: 103 mEq/L (ref 96–112)
Potassium: 4.6 mEq/L (ref 3.5–5.1)

## 2010-05-31 LAB — PROTIME-INR
INR: 2.3 ratio — ABNORMAL HIGH (ref 0.8–1.0)
Prothrombin Time: 24.1 s — ABNORMAL HIGH (ref 10.2–12.4)

## 2010-05-31 MED ORDER — AMIODARONE HCL 200 MG PO TABS: 100.0000 mg | ORAL_TABLET | Freq: Every day | ORAL | Status: DC

## 2010-05-31 MED ORDER — DABIGATRAN ETEXILATE MESYLATE 150 MG PO CAPS: 150.0000 mg | ORAL_CAPSULE | Freq: Two times a day (BID) | ORAL | Status: DC

## 2010-05-31 NOTE — Progress Notes (Signed)
The patient presents today for routine electrophysiology followup.  Since last being seen in our clinic, he reports doing very well.  He has occasional palpitations, which he descibes as  "skipped" beats but does not appear to have sutained afib. He has done well since his ablation.  He was seen by [name] and amiodarone was decresed to 200mg  daily.  Today, he denies symptoms of chest pain, shortness of breath, orthopnea, PND, lower extremity edema, dizziness, presyncope, syncope, or neurologic sequela.  The patient feels that he is tolerating medications without difficulties and is otherwise without complaint today.   Past Medical History  Diagnosis Date  . Persistent atrial fibrillation     s/p afib ablation 02/13/10  . Atrial flutter     s/p CTI ablation 2006 by Ladona Ridgel  . Sleep apnea   . Non-ischemic cardiomyopathy     EF 40%   Past Surgical History  Procedure Date  . Atrial ablation surgery 2006    CTI ablation  . Joint replacement     toe joint  . Tonsillectomy   . Atrial fibrillation ablation 02/13/10    Current outpatient prescriptions:amiodarone (PACERONE) 200 MG tablet, Take 200 mg by mouth daily.  , Disp: , Rfl: ;  carvedilol (COREG) 12.5 MG tablet, Take 12.5 mg by mouth 2 (two) times daily with a meal.  , Disp: , Rfl: ;  ibuprofen (ADVIL,MOTRIN) 200 MG tablet, Take 200 mg by mouth every 6 (six) hours as needed.  , Disp: , Rfl: ;  ramipril (ALTACE) 5 MG capsule, Take 5 mg by mouth 2 (two) times daily.  , Disp: , Rfl:  warfarin (COUMADIN) 5 MG tablet, Take 5 mg by mouth daily. , Disp: , Rfl:   Allergies  Allergen Reactions  . Benzonatate     REACTION: SOB  . Penicillins     REACTION: tolerates amoxicillin    History   Social History  . Marital Status: Married    Spouse Name: N/A    Number of Children: N/A  . Years of Education: N/A   Occupational History  . Not on file.   Social History Main Topics  . Smoking status: Never Smoker   . Smokeless tobacco:  Never Used  . Alcohol Use: No  . Drug Use: No  . Sexually Active: Not on file   Other Topics Concern  . Not on file   Social History Narrative   He is married and lives with his wife in [place].     Family History  Problem Relation Age of Onset  . Cancer Father   . Cancer Mother     ROS-  All systems are reviewed and are negative except as outlined in the HPI above  Physical Exam: Filed Vitals:   05/31/10 0947  BP: 132/80  Pulse: 46  Height: 5\' 7"  (1.702 m)  Weight: 199 lb (90.266 kg)    GEN- The patient is well appearing, alert and oriented x 3 today.   Head- normocephalic, atraumatic Eyes-  Sclera clear, conjunctiva pink Ears- hearing intact Oropharynx- clear Neck- supple, no JVP Lymph- no cervical lymphadenopathy Lungs- Clear to ausculation bilaterally, normal work of breathing Heart- Regular rate and rhythm with frequent ectopy, no murmurs, rubs or gallops, PMI not laterally displaced GI- soft, NT, ND, + BS Extremities- no clubbing, cyanosis, or edema MS- no significant deformity or atrophy Skin- no rash or lesion Psych- euthymic mood, full affect Neuro- strength and sensation are intact  EKG- sinus bradycardia 45 with PACs,  Nonspecific ST/T changes

## 2010-05-31 NOTE — Assessment & Plan Note (Signed)
Stable without CHF on exam

## 2010-05-31 NOTE — Patient Instructions (Signed)
Your physician recommends that you schedule a follow-up appointment in: 3 months with Dr Johney Frame   Your physician recommends that you return for lab work today BMP/CBC/INR--  427.31  Your physician has recommended you make the following change in your medication: decrease your Amiodarone to 100mg  daily, stop Coumadin and Start Pradaxa 150mg  one by mouth twice daily

## 2010-05-31 NOTE — Assessment & Plan Note (Signed)
Maintaining sinus.  PACs today.  We will decrease amiodarone to 100mg  daily today. He wishes to stop coumadin and start pradaxa.  We will start pradaxa 150mg  BID. We will check CrCl and CBC today. Return in 3 months

## 2010-06-09 ENCOUNTER — Encounter: Payer: Self-pay | Admitting: Internal Medicine

## 2010-06-09 NOTE — Progress Notes (Signed)
Addended by: Laurance Flatten on: 06/09/2010 09:03 AM   Modules accepted: Orders

## 2010-06-28 ENCOUNTER — Other Ambulatory Visit: Payer: Self-pay | Admitting: *Deleted

## 2010-06-28 MED ORDER — RAMIPRIL 5 MG PO CAPS: 5.0000 mg | ORAL_CAPSULE | Freq: Two times a day (BID) | ORAL | Status: DC

## 2010-07-13 NOTE — Assessment & Plan Note (Signed)
[facility]                         ELECTROPHYSIOLOGY OFFICE NOTE   NAME:[name]                      MRN:          [id number]  DATE:09/04/2006                            DOB:          [birth year]    SUBJECTIVE:  [name] returns today for followup.  He is a very  pleasant middle-aged male with a history of persistent atrial  fibrillation, who has been maintained very nicely on amiodarone therapy.  He returns for followup.  He has continued to try to lose weight in the  past, but is nearly morbidly obese.  He has now continued to lose weight  and is down from approximately 240 pounds, down to 191 pounds.  The  patient was in the hospital with pneumonia back in March and had a  little brief episode of atrial fibrillation then, but otherwise has been  stable.   CURRENT MEDICATIONS:  1. He is presently on 300 mg q.d. of amiodarone.  2. Other medications include Coreg.  3. Altace.  4. Coumadin.   PHYSICAL EXAMINATION:  GENERAL:  He is a pleasant, well-appearing,  middle-aged man, in no distress.  VITAL SIGNS:  Blood pressure 104/70, pulse 46 and regular, respirations  18, weight 191 pounds.  NECK:  No jugular venous distention.  LUNGS:  Clear bilaterally to auscultation.  No wheezes, rales or  rhonchi.  CARDIOVASCULAR:  A regular bradycardia with normal S1 and S2.  EXTREMITIES:  No edema.   Electrocardiogram demonstrates sinus bradycardia with a corrected QT of  441 msec.   IMPRESSION:  1. Persistent atrial fibrillation.  2. Sinus bradycardia.  3. Amiodarone therapy.  4. Coumadin therapy.   DISCUSSION:  I have discussed the treatment options with the patient.  With regard to his amiodarone, I have recommended that he continue on 1-  1/2 tablets (300 mg) q.d.  Once his weight goes down to below 180  pounds, I have asked that he decrease his dose to 300 mg Monday through  Friday and 200 mg on the weekends.   I will plan to see him  back in the office in several months.    Doylene Canning. Ladona Ridgel, MD  Electronically Signed   GWT/MedQ  DD: 09/04/2006  DT: 09/05/2006  Job #: [id number]

## 2010-07-13 NOTE — Assessment & Plan Note (Signed)
[facility]                         ELECTROPHYSIOLOGY OFFICE NOTE   NAME:[name], [name]                      MRN:          [id number]  DATE:04/19/2007                            DOB:          Oct 12, 1955    [name] returns for follow-up.  He is a very pleasant middle-aged  male with history of persistent atrial fibrillation, sinus node  dysfunction and hypertension.  He returns today for follow-up.  I saw  him back in the summertime and he was doing well and at that time we  decreased his amiodarone dose down to 3 mg daily.  He returns today for  follow-up.  He states that he feels well.  He exercises regularly.  He  has had no specific complaints.  He noted that back in the fall he had  twisted his knee cutting the grass and was laid up for several weeks but  now has continued to lose weight.  He got back up to 200 pounds, now he  is down to 190.  He has no palpitations or symptoms of A fib.  Medications include Coreg 6.25 twice daily, Altace five twice daily,  amiodarone 200 1-1/2 tablets daily, Coumadin 5 mg alternating with 2.5  mg.   On physical exam he is a pleasant well-appearing middle-aged man in no  distress.  Blood pressure today was 109/74, the pulse was 50 and  regular, respirations were 18, the weight was 190 pounds  NECK:  Revealed no jugular venous distention.  LUNGS:  Clear bilaterally to auscultation.  No wheezes, rales or rhonchi  are present.  CARDIOVASCULAR:  Exam regular bradycardia with normal S1-S2.  EXTREMITIES demonstrated no edema.   EKG demonstrates sinus bradycardia with left atrial enlargement.   IMPRESSION:  1. Persistent A fib now maintaining sinus rhythm.  2. Sinus bradycardia (asymptomatic).  3. Chronic amiodarone therapy.  4. Chronic Coumadin therapy.  5. Hypertension.   DISCUSSION:  Overall [name] is stable.  He is maintaining sinus  rhythm very nicely on amiodarone.  Today, we will have him obtain  a  liver panel and TSH.  Otherwise he will continue his current  medications.  My expectation is to decrease his amiodarone dose down to  200 mg a day alternating with 300 mg a day when I see him back in 8  months if he is maintaining sinus rhythm.     Doylene Canning. Ladona Ridgel, MD  Electronically Signed    GWT/MedQ  DD: 04/19/2007  DT: 04/19/2007  Job #: [id number]

## 2010-07-13 NOTE — Assessment & Plan Note (Signed)
 HEALTHCARE                         ELECTROPHYSIOLOGY OFFICE NOTE   NAME:COOPERShailen, Thielen                      MRN:          [id number]  DATE:03/05/2008                            DOB:          08-23-1955    Mr. Torpey returns today for followup.  He is a very pleasant middle-  aged man with persistent atrial fibrillation which has been well-  controlled on fairly high doses of amiodarone.  He returns today for  followup.  He denies chest pain.  He has rare palpitations.  He states  that his heart goes out a rhythm for about 1 or 2 hours at a time once  every 6 weeks or so.  He denies chest pain.  He denies shortness of  breath.  He had no specific complaints today.  He does note that the  skin on his face where he has been exposed to the sun has developed a  bluish tint.   MEDICATIONS:  1. Coreg 6.25 twice daily.  2. Altace 5 twice daily.  3. Amiodarone 200 mg 1-1/2 tablets daily.  4. Coumadin 5 mg on Monday, Wednesday, and Friday, and 2.5 mg on the      other days.   PHYSICAL EXAMINATION:  GENERAL:  He is a pleasant middle-aged man in no  distress.  VITAL SIGNS:  The blood pressure was 110/80, the pulse was 100 and  irregular, respirations were 18, the weight was 190 pounds. NECK:  No  jugular venous distention.  LUNGS:  Clear bilaterally to auscultation.  No wheezes, rales, or  rhonchi are present.  CARDIOVASCULAR:  Irregular rhythm and tachycardia with normal S1 and S2.  ABDOMEN:  Soft, nontender.  EXTREMITIES:  No edema.   EKG demonstrates atrial flutter (atypical) with a rapid ventricular  response at 105 beats per minute.  This flutter appears to be positive  for isoelectric in lead V1 and positive in the inferior leads.   IMPRESSION:  1. Atrial fibrillation.  2. Atrial flutter (left atrial on amiodarone).  3. Hypertension.  4. Chronic Coumadin therapy.   DISCUSSION:  Overall, Mr. Virden is stable.  His heart is at a rhythm  today, but it sounds like he has been very little out of rhythm in the  last year.  The patient is somewhat concerned about his skin on  amiodarone.  To this effect, I have talked about the treatment options  including the possibility of catheter ablation.  The patient will  consider his options and call us at some point down the road if he would  like to proceed with the catheter ablation procedure.  With regard to  his amiodarone, I have recommended that he continue at 300 a day.  Hopefully, if he proceeds  with an ablation therapy, we can get him off of his amiodarone or  decrease it markedly.  The patient will continue on his Coumadin at the  present time.     Doylene Canning. Ladona Ridgel, MD  Electronically Signed    GWT/MedQ  DD: 03/05/2008  DT: 03/06/2008  Job #: [id number]

## 2010-07-16 NOTE — Assessment & Plan Note (Signed)
[facility]                         ELECTROPHYSIOLOGY OFFICE NOTE   NAME:[name], [name]                      MRN:          [id number]  DATE:03/01/2006                            DOB:          1955/10/21    Mr. Cartlidge returns today for a follow-up.  He is a very pleasant middle-  aged male with a history of persistent atrial fibrillation, asymptomatic  sinus bradycardia and hypertension and obesity.  The patient notes that  he has lost 30 pounds in the last three months utilizing a combination  of exercise and some dietary indiscretion.  He has very minimal symptoms  of atrial fibrillation.  He is presently on amiodarone 200 mg a day.   OTHER MEDICATIONS:  1. Altace 5 twice a day.  2. Coreg 6.25 twice a day.  3. Coumadin.   PHYSICAL EXAMINATION:  GENERAL APPEARANCE:  He is a pleasant well-  appearing man in no distress.  VITAL SIGNS:  Blood pressure was 112/72, pulse was 38 and regular,  respirations 18, weight 198 pounds.  NECK:  No jugular venous distension.  LUNGS:  Clear to auscultation bilaterally.  CARDIOVASCULAR:  Regular bradycardia with normal S1 and S2.  EXTREMITIES:  No edema.   EKG demonstrated sinus bradycardia with incomplete left bundle branch  block and left axis deviation.   IMPRESSION:  1. Persistent atrial fibrillation.  2. Chronic amiodarone therapy.  3. Chronic Coumadin therapy.  4. Sinus bradycardia (asymptomatic).   DISCUSSION:  Overall, Mr. Boughner is stable.  He will continue on his  weight loss program.  He has lost 30 pounds.  I will plan to see him  back in 6 months.  I have asked that he decrease his amiodarone today  from 400 mg a day to 300 mg a day.  If he has worsening or breakthrough  episodes of atrial fibrillation, then he would certainly be a candidate  down the road for consideration for atrial fibrillation ablation.     Doylene Canning. Ladona Ridgel, MD  Electronically Signed    GWT/MedQ  DD: 03/01/2006  DT:  03/01/2006  Job #: [id number]

## 2010-07-16 NOTE — Procedures (Signed)
NAME:  [name], [name] NO.:  [id number]   MEDICAL RECORD NO.:  [id number]          PATIENT TYPE:  OUT   LOCATION:  SLEEP CENTER                 FACILITY:  [facility]   PHYSICIAN:  Marcelyn Bruins, M.D. [facility] DATE OF BIRTH:  06/20/1955   DATE OF STUDY:  01/19/2004                              NOCTURNAL POLYSOMNOGRAM   REFERRING PHYSICIAN:  Rollene Rotunda, M.D.   INDICATION FOR THIS STUDY:  Hypersomnia with sleep apnea.  Epsworth score is  4.   SLEEP ARCHITECTURE:  The patient had a total sleep time of 275 minutes with  decreased REM and slow way sleep.  Sleep onset latency was normal, however,  REM latency was somewhat prolonged.   IMPRESSION:  1.  Very mild obstructive sleep apnea/hypopnea syndrome with a respiratory      disturbance index of seven events per hour and O2 desaturation down to      87%.  Because of the small numbers of events, the patient did not meet      split night criteria.  2.  Moderate to loud snoring noted throughout the study.  3.  Atrial flutter/fibrillation with controlled ventricular response was      noted throughout.      KC/MEDQ  D:  02/09/2004 11:26:09  T:  02/09/2004 12:21:14  Job:  [id number]

## 2010-07-16 NOTE — Discharge Summary (Signed)
[place]  Patient:    [name], [name] Visit Number: [id number] MRN: [id number]          Service Type: MED Location: [phone] Attending Physician:  Dorena Cookey Dictated by:   Rozell Searing, P.A.-C. Admit Date:  05/29/2001   CC:         Claretta Fraise, M.D. [facility]   Referring Physician Discharge [place]  PROCEDURES: 1. Coronary angiogram, May 31, 2001. 2. 2-D echocardiogram.  REASON FOR ADMISSION:  Please refer to dictated admission note.  LABORATORY DATA:  CBC essentially normal on admission, save for mild thrombocytopenia (145) - platelets 150 at discharge.  Normal WBCs with mildly elevated neutrophils (83%) on admission.  INR 1.2.  Serial metabolic profiles notable for normal electrolytes and renal function on admission.  Potassium 3.0 prior to discharge with normalization to 4.2 at discharge.  Cardiac enzymes:  Negative serial CPK-MBs with marginally elevated troponin I (peak 0.06).  BNP 781.  Lipid profile:  Total cholesterol 180, triglycerides 108, HDL 45, LDL 113 (ratio 4.0).  TSH 2.97.  Iron profile within normal limits; normal ferritin.  CT scan of the head:  Negative for acute changes; CT scan of chest:  Large, bilateral pleural effusions; compressive basilar atelectasis - negative for adenopathy.  HOSPITAL COURSE:  [name] is a 55 year old male, with no previous history of heart disease, who was seen in consultation by Dr. Antoine Poche for new onset paroxysmal atrial fibrillation with rapid ventricular response and associated dyspnea.  Initial treatment was restricted secondary to hypotension (systolic 90s). The patient was placed on intravenous heparin and initially placed on intravenous Diltiazem.  Given his hypotension; however, he was started on digoxin load.  Initial echocardiography (portable) revealed EF approximately 15%.  Formal echocardiography estimated ejection fraction at 35% with anterior/septal  wall HK.  There was also a small pericardial effusion noted posteriorly.  Following stabilization of pulmonary status and improved rate control, the patient was referred for coronary angiography, performed May 31, 2001, by Dr. Simona Huh (see report for full details).  Coronary anatomy notable only for an 80% RV marginal lesion with otherwise normal coronary arteries; left ventriculogram revealed EF of approximately 20% with no evidence of mitral regurgitation.  Following this procedure, the patient was started on Coumadin 5 mg q.d. Medications were adjusted with recommendation to decrease Lasix to 20 q.d. at time of discharge.  Aspirin was also discontinued.  Dr. Antoine Poche questioned whether the dilated cardiomyopathy could be related to tachycardia versus a viral illness.  The patient denied any prior history of alcohol use.  The patient was cleared for discharge on hospital day #3 in hemodynamically stable condition:  Blood pressure 110/78 with pulse in the 80s irregular, and saturations O2 of 93% on room air.  Metabolic profile at discharge notable for sodium 140, potassium 4.2, BUN 19, and creatinine 1.4.  DISCHARGE MEDICATIONS: 1. Coumadin 5 mg q.d. 2. Lasix 20 mg q.d. 3. Pepcid 20 mg b.i.d. 4. Ocean nasal spray 2 sprays q.i.d. as directed. 5. Humibid L.A. 600 mg b.i.d. 6. K-Dur 20 mEq q.d. 7. Nasacort AQ 2 puffs q.d. 8. Lanoxin 0.25 mg q.d. 9. Altace 2.5 mg q.d.  INSTRUCTIONS: 1. The patient is to refrain from returning to work until cleared by his    physician. 2. Refrain from any added salt in his diet.  The patient is scheduled for a follow-up pro time at the [facility] on Tuesday, June 05, 2001, at 10:30 a.m.  The patient will follow up with Dr. Daiva Nakayama in the heart failure clinic on Thursday, June 07, 2001, at 4 p.m.  The patient is also instructed to schedule a follow-up appointment with Dr. Baldo Daub in the ensuing 1-2  weeks.  DISCHARGE DIAGNOSES: 1. Dilated cardiomyopathy.    a. Question tachycardia-induced.    b. Question viral illness.    c. Nonobstructive coronary artery disease; ejection fraction approximately       20% - cardiac cath May 31, 2001. 2. Paroxysmal atrial fibrillation with rapid ventricular response.    a. New onset.    b. Coumadin anticoagulation initiated. 3. Hypokalemia. Dictated by:   Rozell Searing, P.A.-C. Attending Physician:  Dorena Cookey DD:  06/01/01 TD:  06/01/01 Job: [id number]

## 2010-07-16 NOTE — H&P (Signed)
NAME:  [name], [name]               ACCOUNT NO.:  [id number]   MEDICAL RECORD NO.:  [id number]          PATIENT TYPE:  INP   LOCATION:  NA                           FACILITY:  [facility]   PHYSICIAN:  Rollene Rotunda, M.D.   DATE OF BIRTH:  24-Jan-1956   DATE OF ADMISSION:  DATE OF DISCHARGE:                                HISTORY & PHYSICAL   PRIMARY CARE PHYSICIAN:  None.   REASON FOR PRESENTATION:  A patient with atrial flutter, tachy-brady  syndrome, and cardiomyopathy.   HISTORY OF PRESENT ILLNESS:  The patient is a 55 year old gentleman with a  longstanding history of atrial arrhythmias including flutter and  fibrillation.  He has had a nonobstructive coronary disease and a  tachycardia mediated cardiomyopathy.  He has failed Tikosyn therapy in the  past with QT prolongation.  He has had recurrent breakthroughs of  fibrillation and predominantly flutter on amiodarone.  When has been in  sinus rhythm, he has had bradycardia with rates in the 40s.  He was in the  emergency room on Monday with sustained flutter 2:1 conduction.  He  underwent DC cardioversion to sinus rhythm (I do not have these records).  He presents to the emergency room  now.  He says he held sinus rhythm for a  couple of days.  He has now been in a rapid flutter for the last day and a  half.  He feels his chest pounding.  He has not had any shortness of breath.  Denies any presyncope or syncope.  He denies any chest discomfort, neck  discomfort, arm discomfort, activity induced nausea, vomiting, excessive  diaphoresis.  He says he urinates a lot when he is in this rhythm.   PAST MEDICAL HISTORY:  1.  Nonobstructive coronary disease.  2.  Cardiomyopathy (His ejection fraction has been in the 25% range, though      the most recent echocardiogram done, February 2006, demonstrated the EF      was grossly normal in a sinus rhythm).   PAST SURGICAL HISTORY:  1 .  Left bunionectomy on the first MTP with a joint  replacement.  1.  Tonsillectomy.   ALLERGIES/INTOLERANCE:  TIKOSYN caused QT prolongation.   MEDICATIONS:  1.  Coumadin.  2.  Altace 5 mg b.i.d.  3.  Coreg 6.25 mg b.i.d.  4.  Amiodarone 200 mg every day.   SOCIAL HISTORY:  The patient is married.  He has children.  He does not  smoke cigarettes and does not drink alcohol.   FAMILY HISTORY:  Positive for coronary disease in his sister.   REVIEW OF SYSTEMS:  As stated in the HPI and negative for other systems.   PHYSICAL EXAMINATION:  GENERAL:  The patient is in no distress.  VITAL SIGNS:  Blood pressure 116/68, heart rate 120 and regular, weight 206  pounds, body mass index 32.  HEENT:  Eyes unremarkable.  Pupils equal, round, and reactive to light.  Fundi not visualized.  NECK:  No jugular venous distention.  Wave form within normal limits.  Carotid upstroke brisk and symmetric.  No bruits.  No thyromegaly.  LYMPHATICS:  No adenopathy.  LUNGS:  Clear to auscultation bilaterally.  BACK:  No costovertebral angle tenderness.  CHEST:  Unremarkable.  HEART:  PMI not displaced or sustained.  S1 and S2 within normal limits.  No  S3.  No murmurs.  ABDOMEN:  Mildly obese.  Positive bowel sounds, normal in frequency and  pitch.  No bruits, no rebound, no guarding, no midline pulsatile mass, no  organomegaly.  SKIN:  No rashes.  No nausea.  EXTREMITIES:  Pulses 2+ throughout.  No edema.  No cyanosis.  No clubbing.  NEURO:  Oriented to person, place and time.  Cranial nerves II-XII grossly  intact.  Motor grossly intact.   EKG:  Atrial flutter with 2:1 conduction, left anterior fascicular block,  poor anterior R wave progression.   ASSESSMENT/PLAN:  1.  Atrial flutter.  The patient is now back in atrial flutter with a 2:1      conduction.  He has very typically had reduced ejection fraction when he      sustains this.  He has converted back to his arrhythmia despite      cardioversion a few days ago and maintenance of amiodarone.   When he is      in sinus, he is bradycardic.  I have now convinced him that atrial      flutter ablation is the best option.  He has seen Dr. Ladona Ridgel in the past      and Dr. Graciela Husbands and discussed this.  At one point, he was in fibrillation      and __________  at that time he declined intervention.  He now consents      to this.  He consents to being admitted for rate control.  He will      continue his anticoagulation.  2.  Cardiomyopathy.  He will continue the medications as listed.  This was      noted to be improved when he was back in sinus rhythm most recently.      JH/MEDQ  D:  05/13/2004  T:  05/13/2004  Job:  [id number]

## 2010-07-16 NOTE — Discharge Summary (Signed)
NAMEBURNEY, CALZADILLA               ACCOUNT NO.:  [id number]   MEDICAL RECORD NO.:  [id number]          PATIENT TYPE:  INP   LOCATION:  4731                         FACILITY:  [facility]   PHYSICIAN:  Rollene Rotunda, M.D.   DATE OF BIRTH:  1956-01-07   DATE OF ADMISSION:  05/13/2004  DATE OF DISCHARGE:  05/14/2004                                 DISCHARGE SUMMARY   OPERATION/PROCEDURE:  None.   HOSPITAL COURSE:  Mr. Rappaport is a 55 year old male with recurrent atrial  fibrillation with 2:1 conduction.  He also has a tachycardia related  cardiomyopathy with an EF as low as 15%.  He came to the emergency room this  past Monday and had direct current cardioversion.  He came back to the  hospital on March 16 because he had palpitations and was back in atrial  flutter.  Mr. Goheen was evaluated by Dr. Antoine Poche.  He converted  spontaneously to normal sinus rhythm before IV Cardizem could be started.  His INR was slightly subtherapeutic at 1.8 but he is on Coumadin and has  been compliant with his medications. On May 14, 2004, Mr. Iten was  maintaining sinus rhythm.  He was evaluated by Dr. Antoine Poche who felt that  his amiodarone should be increased to 200 mg q.12h.  He felt that Mr. Buerkle  could be discharged to home on increased dose of Coumadin and an increased  dose of amiodarone and return on Tuesday for outpatient flutter ablation.   DISCHARGE DIAGNOSES:  1.  Paroxysmal atrial flutter with rapid ventricular response.  2.  Anticoagulation with Coumadin.  3.  Allergy to PENICILLIN.  4.  Nonischemic cardiomyopathy with an ejection fraction as low as 15%.  5.  History of nonobstructive coronary artery disease by catheterization in      2003.  6.  History of QT prolongation secondary to __________.   DISCHARGE INSTRUCTIONS:  1.  His activity level is to include no strenuous activity.  2.  He is to stick to a diet that is low in fat.  3.  He is to return for ablation as scheduled on  Tuesday.  4.  He is to follow up after the ablation in the office with Dr. Antoine Poche.   DISCHARGE MEDICATIONS:  1.  Coumadin as prior to admission with taking an extra 2.5 mg tonight.  2.  Coreg 2.25 mg b.i.d.  3.  Altace 5 mg b.i.d.  4.  Amiodarone 200 mg q.12h.      RB/MEDQ  D:  05/14/2004  T:  05/15/2004  Job:  [id number]

## 2010-07-16 NOTE — Cardiovascular Report (Signed)
NAMEMarland Kitchen  HYMIE, GORR NO.:  [id number]   MEDICAL RECORD NO.:  [id number]          PATIENT TYPE:  OIB   LOCATION:  2872                         FACILITY:  [facility]   PHYSICIAN:  Jonelle Sidle, M.D. LHCDATE OF BIRTH:  Jan 20, 1956   DATE OF PROCEDURE:  DATE OF DISCHARGE:                              CARDIAC CATHETERIZATION   REQUESTING PHYSICIAN:  Doylene Canning. Ladona Ridgel, M.D.   PRIMARY CARDIOLOGIST:  Rollene Rotunda, M.D.   INDICATIONS:  Mr. Timmons is a 55 year old male with a history of paroxysmal  atrial flutter as well as previous atrial fibrillation.  He has failed  therapy with Tikoysn due to QT prolongation and is now on amiodarone with  recurrences of atrial flutter.  He has been managed with Coumadin and  recently had a subtherapeutic INR in mid March.  He is now being considered  for radiofrequency ablation of atrial flutter and, prior to this, a  transesophageal echocardiographic study is requested to exclude significant  intracardiac thrombus.   DESCRIPTION OF PROCEDURE:  After informed consent was obtained, the patient  was taken to the endoscopy suite.  He received sedation with Demerol 25 mg  IV and Versed 3 mg IV as well as receiving Cetacaine spray for full  pharyngeal anesthesia.  A standard multiplane transesophageal  echocardiographic probe was easily inserted into the esophagus and the  patient tolerated the procedure well without obvious complications.   FINDINGS:  1.  Left ventricular ejection fraction is low normal to mildly decreased.      The patient is in sinus rhythm during the study.  2.  Shadowing is noted at the mouth of the left atrial appendage which looks      to be artifact from echodense structures in this area.  There is no      evidence of obvious large thrombus in the left atrial appendage.  3.  Left and right atrium are free of significant thrombus.  Interrogation      of the intra-atrial septum with color-flow Doppler shows no  evidence of      obvious intra-atrial communication.  There is some spontaneous echo      contrast noted in the right atrium associated with the peripheral IV      running during the procedure.  4.  The right atrial appendage shows no evidence of obvious thrombus.  5.  Trace mitral regurgitation and trace pulmonic regurgitation are noted.  6.  The aortic valve is trileaflet with grossly preserved cusp excursion and      no significant aortic regurgitation.  7.  Right ventricular contraction is within normal limits.  8.  Interrogation of the descending aorta shows mild atherosclerotic      plaquing with no obvious large thrombus.   CONCLUSIONS:  1.  Low normal to mildly decreased left ventricular ejection fraction in the      setting of normal sinus rhythm during the procedure.  2.  No obvious large intracardiac thrombus is noted in the left atrial      appendage, right atrial appendage, left atrium,      or right  atrium.  3.  Trace mitral and pulmonic regurgitation.  4.  Mild atherosclerotic plaquing in the ascending aorta.      SGM/MEDQ  D:  05/19/2004  T:  05/19/2004  Job:  [id number]   cc:   Doylene Canning. Ladona Ridgel, M.D.   Rollene Rotunda, M.D.

## 2010-07-16 NOTE — Consult Note (Signed)
NAME:  [name], [name] NO.:  [id number]   MEDICAL RECORD NO.:  [id number]          PATIENT TYPE:  EMS   LOCATION:  MAJO                         FACILITY:  [facility]   PHYSICIAN:  [place] Bing, M.D.  DATE OF BIRTH:  [birth year]   DATE OF CONSULTATION:  DATE OF DISCHARGE:  05/03/2004                                   CONSULTATION   CARDIOLOGY EMERGENCY DEPARTMENT CONSULTATION   REFERRING PHYSICIAN:  Dr. Read Drivers.   PRIMARY CARDIOLOGIST:  Dr. Antoine Poche.   PRIMARY PHYSIOLOGIST:  Dr. Ladona Ridgel.   HISTORY OF PRESENT ILLNESS:  A 55 year old gentleman with a three-year  history of paroxysmal atrial arrhythmias.  His records indicate that his  initial rhythms were atrial fibrillation.  I have no documentation of this.  He was treated with dofetilide for two years but ultimately developed a long  Q-T and/or lack of efficacy with this drug.  He has received amiodarone for  the past five months.  He has had problems with sick sinus syndrome and  episodic sinus bradycardia with rates into the 30s, but these are  asymptomatic.  He feels poorly in his atrial arrhythmia with weakness,  malaise, and increased urination.  There is no lightheadedness, chest pain,  dyspnea, or syncope.   He developed symptoms a few days ago.  He called the office today and was  referred to the emergency department.   He describes other episodes of tachy arrhythmia occurring on a weekly basis,  but these usually revert spontaneously within an hour.  He has been told  that at least some of his rhythms in the past have been atrial flutter.  Discussions of radiofrequency ablation and pacing have been raised, but the  patient is not interested in any of these procedures.   SOCIAL HISTORY:  No use of tobacco products or alcohol.   FAMILY HISTORY:  No arrhythmias.   REVIEW OF SYSTEMS:  Unremarkable.   ALLERGIES:  He describes an allergy to PENICILLIN.   PHYSICAL EXAMINATION:  VITAL SIGNS:  Temperature  98, heart rate 130,  respirations 20, blood pressure 145/60.  O2 saturation 98% on room air.  GENERAL:  A pleasant, well-appearing young gentleman.  HEENT:  Anicteric sclerae.  NECK:  Jugular venous distention.  No carotid bruits.  LUNGS:  Clear.  HEART:  Normal first and second heart sounds.  Minimal systolic murmur.  ABDOMEN:  Soft and nontender.  No organomegaly.  Normal bowel sounds without  bruits.  EXTREMITIES:  No edema.  Normal distal pulses.  Very muscular.  NEUROLOGIC:  Normal mental status.  Normal cranial nerves.  Normal strength  and tone.   EKG:  Atrial flutter with 2:1 AV conduction.  Right bundle branch block.  Left anterior fascicular block.   Office reports stable and therapeutic anticoagulation with the last INR  being 2.6 one week ago.   IMPRESSION:  [name] has paroxysmal atrial arrhythmias with a history of  tachycardia-induced cardiomyopathy.  He has no evidence of congestive heart  failure at the present time.  I explained to him that cardioversion would  not likely be a permanent  solution for him and that his arrhythmia may occur  after a fairly short interval.  He is not interested in considering  radiofrequency ablation or pacing at the present time.  Accordingly, it was  elected to proceed with cardioversion.  Anesthesia was called and  administered penthothal.  A single synchronized discharge of 120 joules  resulted in a return to sinus rhythm.  The patient tolerated the procedure  well and will be discharged from the emergency department as soon as mental  status returns to normal.  Follow up with Dr. Antoine Poche and Dr. Ladona Ridgel will  be arranged.      RR/MEDQ  D:  05/03/2004  T:  05/03/2004  Job:  [id number]

## 2010-07-16 NOTE — Assessment & Plan Note (Signed)
[facility]                           ELECTROPHYSIOLOGY OFFICE NOTE   NAME:COOPERCasson, Catena                      MRN:          [id number]  DATE:10/12/2005                            DOB:          1956/02/15    Mr. Shirer is a very pleasant middle-aged man with a history of atrial  fibrillation and tachycardia induced cardiomyopathy. We have maintained him  in sinus rhythm for the most part for the last several weeks. The patient  had breakthroughs of A fib on 200 a day of amiodarone and back in July we  upped him to 200 mg 3 times a day. He returns today in followup. He says he  has had a couple episodes of breakthrough A fib but continues to be  bradycardic but states that when his heart rate is in the 40s he actually  feels quite well. He has had no syncope. His heart failure symptoms are  class one at the present time.   PHYSICAL EXAMINATION:  GENERAL:  He is a pleasant, well-appearing, middle-  aged man in no acute distress.  VITAL SIGNS:  Blood pressure was 126/80, the pulse 56 and regular,  respirations were 18, the weight was 218 pounds.  NECK:  Revealed no jugular venous distention.  LUNGS:  Clear bilaterally to auscultation.  CARDIOVASCULAR:  Revealed a regular bradycardia with normal S1 and S2.  EXTREMITIES:  Demonstrated no edema.   EKG demonstrated sinus bradycardia.   IMPRESSION:  1. Persistent A fib.  2. Chronic amiodarone therapy.  3. Tachycardia induced cardiomyopathy.   DISCUSSION:  Today I have that Mr. Hoerner continue on 600 mg daily of  amiodarone for one more month. After this, I have asked that he decrease his  dose to 400 mg daily. I will see him back in the office in December and at  that time if he has been maintained in sinus rhythm will try to decrease him  down to 300 mg daily. Additional decrements will be carried out depending on  how well he stays in sinus rhythm. Ultimately he may require A fib ablation.                                   Doylene Canning. Ladona Ridgel, MD   GWT/MedQ  DD:  10/12/2005  DT:  10/12/2005  Job #:  [id number]

## 2010-07-16 NOTE — Discharge Summary (Signed)
NAME:  [name], [name]               ACCOUNT NO.:  [id number]   MEDICAL RECORD NO.:  [id number]          PATIENT TYPE:  OIB   LOCATION:  4729                         FACILITY:  [facility]   PHYSICIAN:  Doylene Canning. Ladona Ridgel, M.D.  DATE OF BIRTH:  March 09, 1955   DATE OF ADMISSION:  05/19/2004  DATE OF DISCHARGE:  05/20/2004                                 DISCHARGE SUMMARY   DISCHARGE DIAGNOSES:  1.  Discharging day #1 status post radiofrequency catheter ablation of      typical atrial flutter with bidirectional isthmus block which remained      persistent, Dr. Lewayne Bunting.  2.  Transesophageal echocardiogram May 19, 2004.  The patient is in sinus      rhythm during the procedure.  The patient had a low normal and mildly      decreased left ventricular ejection fraction.  There was some shadowing      at the mouth of the left atrial appendage, seen to be an artifact      without obvious thrombus.  No thrombus in the right atrial appendage,      left atrium, right atrium, or the chordae.  Trace mitral regurgitation,      trace pulmonary regurgitation, normal aortic valve leaflet motion,      trileaflet.  3.  History of atrial fibrillation/flutter with Coumadin anticoagulation.   SECONDARY DIAGNOSES:  1.  Left heart catheterization 2003 demonstrating nonobstructive coronary      artery disease.  2.  Possible sick sinus syndrome with bradycardia.  3.  Tikosyn therapy for atrial fibrillation failed secondary to prolonged      QT, now on amiodarone.   PROCEDURE:  1.  Transesophageal echocardiogram May 19, 2004 as dictated above.  2.  Radiofrequency catheter ablation of typical atrial flutter using the      Cool Path device, Dr. Lewayne Bunting.  The patient has had no      complications post procedurally and discharging postprocedure #1 in      sinus rhythm.   DISCHARGE DISPOSITION:  The patient discharging day #1 after radiofrequency  catheter ablation.  He is achieving 95% oxygen saturation on  room air.  His  heart rate is slow with heart rate in the high 40s, low 50s.  It is a sinus  bradycardia.  Blood pressure is 97/60.  The right groin catheterization site  and the right neck sites are without swelling or erythema.  They are sore,  but respond to oral analgesia.  PT on day of admission March 22 is 22.6, INR  is 2.6.  The patient discharged on the following medications:  1.  Coreg 6.25 mg twice daily.  2.  Altace 5 mg twice daily.  3.  Amiodarone 200 mg twice daily.  4.  Coumadin 5 mg tablets, 5 mg Tuesday, Thursday, Saturday, Sunday; 2.5 mg      Monday, Wednesday, Friday.  5.  New medication added is enteric coated aspirin 81 mg daily.   Note, if the patient needs dental work, even just teeth cleaning he is to  call the office at [phone] for  antibiotic coverage.  This applies through  September 2006.   PAIN MANAGEMENT:  Tylenol 325 mg 1-2 tabs every 4-6h as needed for pain.  He  is to avoid driving for the next two days, avoid straining of heavy lifting  for the next two weeks.   DISCHARGE DIET:  Low sodium, low cholesterol diet.  The patient may shower.  He is to call [phone] if he experiences increased pain or swelling at the  catheterization site.  He has followup at [facility],  [facility], for EKG in one week.  Research nurses will call.  He is to see Dr.  Ladona Ridgel Tuesday June 22, 2004 at 10 in the morning.   BRIEF HISTORY:  [name] is a 55 year old male with longstanding history  of atrial fibrillation/flutter.  He has tachycardia mediated nonischemic  cardiomyopathy with ejection fraction estimated at 20% at a left heart  catheterization in April 2003.  He has been taken off Tikosyn secondary to  prolonged QT.  He has paroxysmal atrial fibrillation/flutter on amiodarone  and has been on amiodarone for the last five months.  Seven days ago he  presented to [facility] Room with sustained atrial flutter with 2:1  conduction.  At that  time, he underwent DC cardioversion to sinus rhythm.  He held sinus rhythm for 48 hours and then lapsed back into  tachyarrhythmias.  He can tell by the fact that he feels palpitation.  He  does not get chest pain, dyspnea, presyncope, or syncope.  He gets no nausea  or diaphoresis.  He is currently on Coumadin. He presented to [facility] March 16 on his return to arrhythmia.  His amiodarone was increased  to 200 mg twice daily.  His Coumadin was subtherapeutic and was increased.  He was discharged March 17 to return Tuesday March 22 for radiofrequency  catheter ablation of atrial flutter.  He will be seen by Dr. Ladona Ridgel prior to  the procedure.  Because of decreased INR, he will undergo transesophageal  echocardiogram prior to ablation.   HOSPITAL COURSE:  The patient presented electively Wednesday March 22.  He  underwent transesophageal echocardiogram which showed no evidence of  thrombus as dictated above. He also then underwent radio frequency catheter  ablation successfully by Dr. Lewayne Bunting.  The patient has had no  complications in the post procedure.  Discharges with medication and  followup as described above.      GM/MEDQ  D:  05/20/2004  T:  05/20/2004  Job:  [id number]   cc:   Rollene Rotunda, M.D.

## 2010-07-16 NOTE — Cardiovascular Report (Signed)
[facility]  Patient:    [name], [name] Visit Number: [id number] MRN: [id number]          Service Type: MED Location: [phone] Attending Physician:  Dorena Cookey Dictated by:   Jonelle Sidle, M.D. [facility] Proc. Date: 05/31/01 Admit Date:  05/29/2001   CC:         Rollene Rotunda, M.D. [facility]  Claretta Fraise, M.D.   Cardiac Catheterization  DATE OF BIRTH: [birth year]  [place] CARDIOLOGIST: Rollene Rotunda, M.D.  PRIMARY CARE PHYSICIAN: Claretta Fraise, M.D.  INDICATIONS: The patient is a 55 year old male with no previous catheter history or other major medical illness besides a questionable history of rheumatic fever at age 89, who presents with a four-week history of progressive fatigue, dyspnea on exertion, and paroxysmal nocturnal dyspnea, culminating in new onset atrial fibrillation with rapid ventricular response. A 2-D echocardiography shows severe left ventricular dysfunction. The patient has been titrated on medical therapy and is now referred for left and right heart catheterization to evaluate hemodynamics and coronary anatomy.  PROCEDURES PERFORMED: 1. Left heart catheterization. 2. Right heart catheterization. 3. Left ventriculography. 4. Selective coronary angiography.  ACCESS AND EQUIPMENT: The area about the right femoral artery and vein was anesthetized with 1% lidocaine. A 6 French sheath was placed in the right femoral artery via the modified Seldinger technique and an 8 French sheath was placed in the right femoral vein via the modified Seldinger technique. Standard preformed 6 [place] and JR4 catheters were used for selective coronary angiography. A 6 French angled pigtail catheter was used for left heart catheterization and left ventriculography. A 7.5 French balloon-tipped, flow-directed catheter was used for right heart catheterization and hemodynamic assessment. Exchanges were made over a  wire. The patient tolerated the procedure well without obvious complications.  HEMODYNAMICS: Right atrium, mean pressure of 5, right ventricle 25/4, pulmonary artery 25/12 with a mean pressure of 17, pulmonary capillary wedge pressure, mean pressure of 13. Left ventricle 99.8, aorta 99.67. Cardiac output 3.4 (thermodilution) and 4.0 (Fick). Cardiac index 1.7 (thermodilution) and 2.0 (Fick), 95% arterial saturation and 56% pulmonary artery saturation.  ANGIOGRAPHIC FINDINGS: 1. The left main coronary artery is free of flow-limiting coronary    atherosclerosis. 2. The left anterior descending is a medium caliber vessel that supplies    two diagonal branches. No significant flow-limiting coronary    atherosclerosis is noted in this system. 3. There is a medium sized intermedius branch noted with significant    flow-limiting coronary atherosclerosis. 4. The circumflex is large and dominant supplying the posterior descending    branch. No significant flow-limiting coronary atherosclerosis is noted    throughout this system. 5. The right coronary artery is a small nondominant vessel. There is an    80% stenosis involving a small right ventricular marginal branch, but    no other flow-limiting coronary atherosclerosis.  LEFT VENTRICULOGRAPHY: Left ventriculography reveals an estimated left ventricular ejection fraction of 20% in the setting of atrial fibrillation with a rapid ventricular response. No significant mitral regurgitation is appreciated.  DIAGNOSES: 1. No flow-limiting coronary atherosclerosis in the major epicardial    vessels. There is an 80% stenosis involving a small right ventricular    marginal branch from a nondominant right coronary artery. 2. Left ventricular ejection fraction estimated at 20% in the setting of    atrial fibrillation with rapid ventricular response. 3. No significant mitral regurgitation noted. 4. Low-normal pulmonary artery and wedge  pressures.  RECOMMENDATIONS: Would continue current medical therapy and improved rate control of atrial fibrillation. It appears that the patient may be somewhat over diuresed based on hemodynamic measurements. Will cut back on Lasix dosing. Angiographic findings are not consistent with an ischemic etiology for the patients cardiomyopathy. Dictated by:   Jonelle Sidle, M.D. [facility] Attending Physician:  Dorena Cookey DD:  05/31/01 TD:  06/01/01 Job: [id number]

## 2010-07-16 NOTE — Discharge Summary (Signed)
NAME:  FLEM, ENDERLE NO.:  [id number]   MEDICAL RECORD NO.:  [id number]                   PATIENT TYPE:  INP   LOCATION:  2025                                 FACILITY:  [facility]   PHYSICIAN:  Rollene Rotunda, M.D. [facility]            DATE OF BIRTH:  March 15, 1955   DATE OF ADMISSION:  DATE OF DISCHARGE:  11/02/2001                                 DISCHARGE SUMMARY   DISCHARGE DIAGNOSES:  1. Atrial fibrillation, with rapid ventricular response.  2. Nonischemic cardiomyopathy, ejection fraction 25 percent.  3. Question of rheumatic fever as a child.  4. Nonobstructive coronary artery disease.  5. History of hypertension relating to medication.  6. History of bradycardia relating to medication.  7. Chronic Coumadin therapy.   HOSPITAL COURSE:  Mr. Canoy is a 55 year old male with known nonischemic  cardiomyopathy.  He was admitted by Dr. Antoine Poche on 09/02 for a Tikosyn.  That evening, the patient was noted to be markedly bradycardiac with a heart  rate dropping into the mid to high 30s and maintained in the low 40s.  His  Coreg initially reduced to 9.375 mg q.12 hours.   The next day the patient was seen by Dr. Sharon Mt.  The patient  continued to have marked bradycardia and his Lanoxin was decreased to 1.25.  His QTC was calculated at 0.360.   The next day the patient was seen by Dr. Antoine Poche.  The patient felt  fatigued.  His QTC appeared to be greater than 500 and his one dose of  Tikosyn was held.  He also continued to be bradycardiac and Coreg was  reduced further to 6.25 mg q.12.   The day, the patient was feeling well.  His digoxin was discontinued.  After  review of EKG, Dr. Antoine Poche felt that the patient was stable for discharge.   DISCHARGE MEDICATIONS:  1. Tikosyn 500 mcg q.12 hours.  2. Coreg 6.25 mg q.12 hours.  3. Altace 7.5 mg q.12 hours.  4. Coumadin 5 mg q.6 p.m.  5. Nasacort as previously taken.  6. Pepcid 20 mg b.i.d.  7.  Lasix 20 mg q.d., p.r.n.  8. Humibid as previously taken.   LABORATORY DATA:  Sodium 137, potassium 4.1, chloride 100, CO2 30, BUN 13,  creatinine 1.2, INR 1.8, magnesium 2.1.   DISCHARGE INSTRUCTIONS:  1. The patient was advised to discontinue taking digoxin.  2. Follow a low-fat and low-salt diet.  3. Followup with a PA visit in the office on 09/12 at 2:30 in the afternoon     with a DMET and magnesium, as well as an EKG.  4. He is to followup with Dr. Antoine Poche in approximately 1 month, office will     call.  5. He is to followup in the [facility] on 09/24 at 2 p.m.  6. He is to followup in the Coumadin Clinic on 09/12 at 2:15 p.m.  7. He is to followed with Dr. Baldo Daub as needed or scheduled.     Annett Fabian, P.A. [facility]                  Rollene Rotunda, M.D. [facility]    CKM/MEDQ  D:  11/02/2001  T:  11/05/2001  Job:  [id number]   cc:   Isla Pence, M.D. [facility]   Rollene Rotunda, M.D. [facility]

## 2010-07-16 NOTE — Consult Note (Signed)
. [facility]  Patient:    [name], [name] Visit Number: [id number] MRN: [id number]          Service Type: MED Location: [phone] Attending Physician:  Rollene Rotunda Dictated by:   Rollene Rotunda, M.D. [facility] Proc. Date: 05/29/01 Admit Date:  05/29/2001 Discharge Date: 06/01/2001   CC:         Claretta Fraise, M.D. [facility]   Consultation Report  DATE OF BIRTH:  Aug 16, 1955  PRIMARY CARE PHYSICIAN:  Claretta Fraise, M.D.  CARDIOLOGIST:  Rollene Rotunda, M.D.  REASON FOR PRESENTATION:  Evaluate patient with shortness of breath and cardiomegaly.  HISTORY OF PRESENT ILLNESS:  The patient is a pleasant 54 year old gentleman with no prior cardiac history. He has been well up until four weeks ago. He has had a very high functional level even being able to exercise without any cardiac symptoms. In particularly he has denied any history of chest discomfort, neck discomfort, arm discomfort to activity, nausea, vomiting, or excessive diaphoresis in the past. He has never noticed palpitations and had no presyncope or syncope. For the last three to four weeks he has had increasing dyspnea with exertion progressing more recently to shortness of breath at night with classic symptoms of PND. He has had no weight gain or swelling. He has had no cough, fevers, or chills. He has denied other symptoms, as above. He has had fatigue. He presented to Dr. Baldo Daub, who obtained a chest x-ray demonstrating cardiomegaly, pulmonary edema, and small bilateral pleural effusions. An EKG demonstrated atrial fibrillation with the rapid ventricular response of 158-120, poor anterior R wave progression, and left axis deviation while in the hospital, left anterior fascicular block. There are nonspecific diffuse T wave changes.  PAST MEDICAL HISTORY:  Questionable rheumatic fever at age 53 (the patient does not recall any classic symptoms such as chorea, arthritis,  or subcutaneous nodules). Otherwise, he denies any significant cardiac past medical history.  PAST SURGICAL HISTORY:  None.  ALLERGIES:  PENICILLIN.  MEDICATIONS: 1. Nasacort. 2. Humibid 3. Doxycycline IV q.12h. 4. Ceftriaxone 1 g IV q.24h. 5. IV Diltiazem. 6. Aspirin 325 mg q.d.  SOCIAL HISTORY:  The patient is married. He has a 40 year old son. He recently lost his job. He was a Financial planner. He has never smoked or has never used alcohol. Uses no illicit drugs.  FAMILY HISTORY:  Contributory for a sister having a stent at age 39.  REVIEW OF SYSTEMS:  Negative except as stated in the HPI. Negative for weight change, heat or cold intolerance, nerves, depression, hair or skin change, nausea, vomiting, diarrhea, constipation, melena, hematemesis. He has had occasional red blood streaking his stools.  PHYSICAL EXAMINATION:  GENERAL:  The patient is in no distress.  VITAL SIGNS:  Blood pressure 90/60, heart rate irregular and approximately 120, afebrile, saturation 99% room air.  HEENT:  Eyelids unremarkable, pupils equal, round, and react to light, fundi not visualized. Oral mucosa unremarkable.  NECK:  Jugular venous distention approximately 12 cm at 45 degrees, carotid upstrokes brisk and symmetric, no bruits, no thyromegaly.  LYMPHATICS:  No cervical, axillary, inguinal adenopathy.  LUNGS:  Bilateral crackles one half the way up, no wheezing.  BACK:  No costovertebral angle tenderness.  CHEST:  Unremarkable.  HEART:  PMI not displaced or sustained, S1 and S2 within normal limits, no S3, no murmurs, no rubs, no lifts or thrills.  ABDOMEN:  Flat, positive bowel sounds normal in frequency and  pitch. No bruits, no rebound, no guarding, midline pulse without mass, no hepatomegaly, splenomegaly.  SKIN:  No rashes, no nodules.  EXTREMITIES:  Reveals 2+ pulses on the left radial, bilateral femorals, 1+ right radial pulse, 1+ dorsalis pedis bilaterally, no edema, no  cyanosis, no clubbing.  NEUROLOGIC:  Oriented to person, place, and time. Cranial nerves II-XII are grossly intact, motor grossly intact.  DIAGNOSTIC DATA:  Chest x-ray:  Cardiomegaly with pulmonary edema and small bilateral pleural effusions.  EKG:  Atrial fibrillation with a rapid ventricular response, poor anterior R wave progression, left axis deviation with possible left fascicular block, nonspecific lateral T wave changes.  Echocardiogram:  I did a quick-look echocardiogram with a hand-held device. His EF is approximately 10-15% with global hypokinesis, although this may well be compromised by his rapid ventricular rate. There does not appear to be severe valvular regurgitation across the mitral or aortic valve. I did not color flow across the tricuspid.  LABORATORY DATA:  hemoglobin 16.6, platelets 145, white blood cells 10, sodium 143, potassium 4.3, BUN 22, creatinine 1.3, CK 56, MB 2.2, troponin 0.06, BNP 781.  ASSESSMENT AND PLAN:  Shortness of breath. This is related to a cardiomyopathy with severe global hypokinesis. This may well be secondary to his atrial arrhythmia and a rapid ventricular rate versus a viral etiology versus an ischemic etiology. I doubt the latter; will exclude other more unlikely possibilities such as pheochromocystosis.  The patient will have a formal echocardiogram to further quantify his EF and to look at his myocardium for any abnormalities. He will need a careful evaluation given his vague description of rheumatic heart disease. Will need to have a measurement of his left atrial size. In the meantime, he will be given Digoxin in an attempt to reduce his ventricular rate which should improve his cardiac output and symptoms and perhaps his blood pressure. This will allow [place] to discontinue his Diltiazem. He will eventually need after load reductions starting with an ACE inhibitor. He will have diuresis  symptomatically. We will continue to  rule him out and he will eventually get a cardiac catheterization. He will get started on heparin and will eventually need Coumadin.  Atrial fibrillation, as above. Dictated by:   Rollene Rotunda, M.D. [facility] Attending Physician:  Rollene Rotunda DD:  05/29/01 TD:  05/29/01 Job: [id number]

## 2010-07-16 NOTE — Op Note (Signed)
NAME:  [name], [name]               ACCOUNT NO.:  [id number]   MEDICAL RECORD NO.:  [id number]          PATIENT TYPE:  OIB   LOCATION:  4729                         FACILITY:  [facility]   PHYSICIAN:  Doylene Canning. Ladona Ridgel, M.D.  DATE OF BIRTH:  08/17/1955   DATE OF PROCEDURE:  05/19/2004  DATE OF DISCHARGE:                                 OPERATIVE REPORT   PROCEDURE PERFORMED:  Invasive electrophysiologic study and RF catheter  ablation of the atrial flutter isthmus.   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 He had a history of  atrial fibrillation in the past and has been fairly nicely controlled with  regard to his atrial fibrillation on amiodarone. He was initially treated  with Tikosyn but this did not work for him. The patient on Tikosyn had been  fairly free of atrial fibrillation, however, he has had recurrent episodes  of atrial flutter requiring cardioversion and visits to the emergency room.  He is now referred for electrophysiologic study and catheter ablation of his  typical atrial flutter.   PROCEDURE:  After informed consent was contained, the patient was taken to  the diagnostic EP lab in fasting state.  After the usual preparation and  draping, intravenous fentanyl and Midazolam were given for sedation. A 6-  [place] hexapolar catheter was inserted percutaneously into the right jugular  vein and advanced to the coronary sinus.  A 5-French quadripolar catheter  was inserted percutaneously in the right femoral vein and advanced to the  His bundle region. A 7-French 20 pole halo catheter was inserted  percutaneously in right the femoral vein and advanced to the right atrium. A  7-French quadripolar ablation catheter was inserted percutaneously in the  right femoral vein and advanced to the right atrium.  After measurement of  the basic intervals, pacing was carried out from the coronary sinus  demonstrating intact atrial flutter isthmus conduction. Because of the  patient's history of atrial fibrillation and the desire not to induce atrial  fibrillation and because of his typical counterclockwise atrial flutter in  the past, the ablation catheter was maneuvered into the usual atrial flutter  isthmus. During coronary sinus pacing, RF energy application was delivered  within the atrial flutter isthmus. 10 RF energy applications were delivered  resulting the creation of isthmus block. The patient was observed for  approximately 30 minutes and during this time, he had persistent atrial  flutter isthmus block both from  pacing from the coronary sinus (left-to-  right block) as well as from pacing from right atrium (right-to-left block).  Also during this time, rapid ventricular pacing was carried out by way of  the ablation catheter from the RV apex at 600 milliseconds. During rapid  ventricular pacing at 600 milliseconds, there is VA dissociation present.  Next programmed atrial stimulation was carried out from the coronary sinus  as well as from the high right atrium at base drive cycle lengths of 161  milliseconds. The S1-S2 interval stepwise decreased from 540 milliseconds  down to 360 milliseconds  where the AV node ERP was observed. During  programmed atrial stimulation, there was no inducible tachycardia.  Next,  rapid atrial pacing was carried out from the coronary sinus as well as the  high right atrium at pacing cycle length of 600 milliseconds and stepwise  decreased down to 490 milliseconds where AV Wenckebach was observed. During  rapid atrial pacing the PR interval was less than the RR interval and there  was no inducible arrhythmias. At this point the catheters were removed.  Hemostasis was assured, and the patient was returned to his room in  satisfactory condition.  It should be noted that during the procedure the  patient had approximately three episodes of atrial  fibrillation lasting less  than 2 minutes each in duration.   COMPLICATIONS:  There were no immediate procedure complications.   RESULTS:  a.  Baseline ECG.  The baseline ECG demonstrates sinus bradycardia  at a cycle length of 1500 milliseconds.  b.  Baseline intervals. The HV interval was 53 milliseconds.  c.  Rapid ventricular pacing. Rapid ventricular pacing from the RV apex  demonstrated VA dissociation at baseline.  d.  Programmed atrial stimulation.  Programmed atrial stimulation was  carried out from the coronary sinus as well as the high right atrium at  basic drive cycle length 147 milliseconds. The S1-S2 interval was stepwise  decreased from 540 milliseconds down to 360 milliseconds resulting in the AV  node ERP. During programmed atrial stimulation, there no AH jumps, no echo  beats and no inducible SVT.  e.  Rapid atrial pacing. Rapid atrial pacing was carried out from the  coronary sinus as well as the high right atrium pace cycle length of 600  milliseconds stepwise and stepwise decreased down to 490 milliseconds where  AV Wenckebach was observed. During probing stimulation, there were no AH  jumps, echo beats or inducible SVT.  f.  Arrhythmias observed.  1. Atrial fibrillation. Initiation spontaneous,  duration nonsustained, termination spontaneous.  g.  Mapping. Mapping of the patient's atrial flutter isthmus demonstrated  the usual size and orientation.   CONCLUSIONS:  Study demonstrates successful electrophysiologic study and  catheter ablation of typical atrial flutter isthmus with a total of 10 RF  energy applications resulting in creation of bidirectional isthmus block in  the atrial flutter isthmus.  This study also demonstrates nonsustained  atrial fibrillation of unknown clinical significance.      GWT/MEDQ  D:  05/19/2004  T:  05/20/2004  Job:  [id number]   cc:   Rollene Rotunda, M.D.

## 2010-07-16 NOTE — Discharge Summary (Signed)
NAME:  [name], [name] NO.:  [id number]   MEDICAL RECORD NO.:  [id number]          PATIENT TYPE:  INP   LOCATION:  2006                         FACILITY:  [facility]   PHYSICIAN:  Raenette Rover. Felicity Coyer, MDDATE OF BIRTH:  [birth year]   DATE OF ADMISSION:  05/22/2006  DATE OF DISCHARGE:  05/26/2006                               DISCHARGE SUMMARY   DISCHARGE DIAGNOSIS:  1. Community acquired pneumonia.  2. History of atrial fibrillation/flutter with episode of bradycardia.  3. Hypotension with acute renal insufficiency.  4. History of non-ischemic cardiomyopathy.   HISTORY OF PRESENT ILLNESS:  Mr. Valadez is a 55 year old male with  history of chronic atrial fibrillation on Coumadin and amiodarone who  [facility] 2-day history of  shortness of breath.  He was admitted for further evaluation and  treatment.   PAST MEDICAL HISTORY:  1. Chronic atrial fibrillation.  2. Hypotension.  3. Obesity.  4. Asymptomatic bradycardia.  5. History of nonischemic cardiomyopathy with ejection fraction of 25%      per 2-D echo 04/2004.   COURSE OF HOSPITALIZATION.:  1. Probable community acquired pneumonia.  The patient was admitted      and underwent a chest x-ray which confirmed  pneumonia.  The      patient was treated with IV Avelox and was changed to oral      antibiotics at time of discharge.  2. Hypotension with acute renal insufficiency.  The patient was noted      be hypotensive on admission with a creatinine of 4.05.  He was      given IV hydration and his creatinine improved.  Creatinine at time      of discharge was 1.36.  Renal ultrasound performed during this      admission was normal. It was felt that the patient's acute renal      insufficiency was secondary to hypoperfusion.  3. History of chronic atrial fibrillation.  The patient was noted to      have positive atrial flutter during this admission.  He was seen by      Dr.  Lewayne Bunting of [facility].  It was recommended the patient      continue amiodarone 400 mg daily for 2 weeks then 300 mg p.o. daily      and to follow up as an outpatient.   MEDICATIONS:  At time of discharge.  1. Avelox 400 mg p.o. daily for six additional days.  2. Amiodarone 400 mg p.o. daily for 2 weeks, then 300 mg p.o. daily,  3. Coumadin as before.  4. Altace 5 mg p.o. b.i.d.  5. Coreg 6.25 mg p.o. b.i.d.   PERTINENT LABORATORY DATA:  At time of discharge:  INR 2.9, magnesium  2.1, BUN 16, creatinine 1.61, hemoglobin 14.   DISPOSITION:  The patient is discharged to home.   FOLLOW UP:  The patient instructed to follow up with Dr. Lewayne Bunting  and with Dr. Drue Novel as needed.      Sandford Craze, NP      Raenette Rover. Felicity Coyer, MD  Electronically Signed    MO/MEDQ  D:  07/21/2006  T:  07/21/2006  Job:  [id number]   cc:   Willow Ora, MD

## 2010-07-16 NOTE — Consult Note (Signed)
[name], [name] NO.:  [id number]   MEDICAL RECORD NO.:  [id number]          PATIENT TYPE:  INP   LOCATION:  2006                         FACILITY:  [facility]   PHYSICIAN:  Doylene Canning. Ladona Ridgel, MD    DATE OF BIRTH:  1955/11/07   DATE OF CONSULTATION:  05/24/2006  DATE OF DISCHARGE:                                 CONSULTATION   REQUESTED BY:  Dr. Felicity Coyer with the Internal Medicine Service.   INDICATION FOR CONSULTATION:  Evaluation of atrial arrhythmias in a  patient with a nonischemic cardiomyopathy and admission for pneumonia.   HISTORY OF PRESENT ILLNESS:  The patient is a very pleasant middle-aged  male with a history of tachycardia-induced cardiomyopathy, who was  admitted to hospital with pneumonia (bilateral lower lobes, left greater  than right).  The patient states that he was well until 3 days ago, when  he developed sudden onset of symptoms of chest pain and shortness of  breath and subsequently presented to the emergency department, where his  chest x-ray demonstrated bilateral pneumonia.  He was initially in sinus  rhythm; however, he has gone into atrial fibrillation which is fairly  coarse, but clearly irregular, not a flutter.  The patient does have a  history of atrial flutter as well as atrial fibrillation and is status  post catheter ablation of atrial flutter several years ago.  The patient  denies syncope.  He has had productive sputum and fevers and chills, but  these are all improving.   SOCIAL HISTORY:  The patient denies tobacco or ethanol use.  She is  married and lives with his wife in [place].   FAMILY HISTORY:  Notable for mother dying of cancer and his father died  of cancer.   REVIEW OF SYSTEMS:  Notable for fevers, chills and night sweats, chest  pain (pleuritic), shortness of breath, dyspnea and orthopnea.  He does  have some nocturia.  He denies dysuria or hematuria.  He denies weakness  or numbness or depression.  He  denies nausea, vomiting, diarrhea and  constipation.  The rest of his review of systems was negative except as  noted in the HPI.   PHYSICAL EXAM:  GENERAL:  He is a pleasant, well-appearing middle-aged  man in no distress.  VITAL SIGNS:  Blood pressure is 123/83.  The pulse was 95 and regular.  Respirations were 20.  Temperature is 97.7.  HEENT:  Normocephalic,  atraumatic.  Pupils are equal and round.  The oropharynx is moist.  The  sclerae are anicteric.  NECK:  No jugular distention.  There is no thyromegaly.  Trachea is  midline.  The carotids are 2+ and symmetric.  LUNGS:  Clear bilaterally to auscultation.  There are no wheezes, rales  or rhonchi.  He had bilateral rhonchi with egophony, left greater than  right.  CARDIOVASCULAR:  Exam revealed a regular rate and rhythm with normal S1  and S2.  EXTREMITIES:  Demonstrated no cyanosis, clubbing or edema.  The pulses  were 2+ symmetric.  NEUROLOGIC:  He is alert and oriented x3.  Cranial nerves were  intact.  The strength was 5/5 and symmetric.   IMPRESSION:  1. Paroxysmal atrial fibrillation.  2. Chronic amiodarone therapy.  3. Chronic Coumadin therapy.  4. Nonischemic cardiomyopathy, ejection fraction of 35%.   DISCUSSION:  I have recommended that we continue the patient's  amiodarone and that he follow back up for additional followup.  He does  not have indication for a redo flutter ablation.  I will plan to follow  the patient with you.      Doylene Canning. Ladona Ridgel, MD  Electronically Signed     GWT/MEDQ  D:  05/24/2006  T:  05/25/2006  Job:  [id number]

## 2010-07-16 NOTE — H&P (Signed)
[place]. [facility]  Patient:    [name], [name] Visit Number: [id number] MRN: [id number]          Service Type: MED Location: [phone] Attending Physician:  Rollene Rotunda Dictated by:   Claretta Fraise, M.D. [facility] Admit Date:  05/29/2001 Discharge Date: 06/01/2001                           History and Physical  IDENTIFICATION STATEMENT:  This is a 55 year old gentleman whose primary care physician is Claretta Fraise, M.D., from the [facility].  CHIEF COMPLAINT:  Possible reaction to medication.  HISTORY OF PRESENT ILLNESS:  This patient was first seen by me as a new patient on May 28, 2001, which was technically yesterday, with a dry, hacking cough, congestion in his chest, sense of wheezing, and some dyspnea on exertion with his current chest congestion.  He had no previous history of respiratory or cardiac illness.  On May 23, 2001, he was seen at [facility] and was started on Levaquin which he quit two days prior to admission secondary to constipation.  He took laxatives, which resolved that problem. He denied any heartburn.  He documented a fever of 99 degrees, which has come and gone.  He denies any rhinorrhea.  He was also given Duratuss through [facility].  He has woken up on and off with some dyspnea with some decreased appetite.  He otherwise denied any orthopnea and lower extremity edema.  He was subsequently sent home yesterday with Biaxin XL pack for what I thought might be pneumonia since he had some bibasilar crackles on exam.  He otherwise did not appear to be in any respiratory distress and not tachypneic.  I had also sent him out on Tessalon Perles 200 mg p.o. b.i.d. and Ventolin HFA two puffs q.i.d. for the first week and then two puffs for the second week with follow-up set up with me in the clinic in two weeks.  If at that time there was no improvement, I was going to go ahead and get a chest x-ray at  that time.  However, the patient apparently called the on-call outpatient doctor with complaints of abdominal cramps and some shortness of breath, fever, and chills approximately two hours after he had taken Tessalon Perles by accident, Robitussin, and the Ventolin inhaler.  All of these symptoms lasted about two-and-a-half hours and then everything got better he said, except for the abdominal cramps which kind of persisted a little bit.  He took [employer] again together with the Ventolin and all of his symptoms recurred, including the feeling of fever and chills.  He has had very little cough now. His shortness of breath technically just comes and goes.  In the clinic a chest x-ray was obtained and subsequently an EKG was obtained. The chest x-ray showed an enlarged heard.  He technically has a very large-looking heart and some suggestion of bilateral pleural effusions.  As to whether he might have some air bronchograms on the right side, it is a possibility.  It certainly looked like he has a widened mediastinum also.  An EKG was done since on auscultation of his heart he was tachycardic.  It seems like he either had skipped beats or occasional ectopic beats rather than true irregularity.  However, the EKG showed a fibrillation/flutter picture, although I can make out some P waves.  His ventricular rate is at about 158. As  to whether he might have some T-wave inversion in his lateral leads versus secondary to the fast rhythm, is a possibility.  I made the decision subsequently for the patient to be admitted.  From that standpoint, his cardiac risk factors are primarily with a sister who has had known coronary artery disease.  The sister, however, was a significant smoker and had hyperlipidemia.  Otherwise there is no other known family history of coronary artery disease.  He does not have a diagnosis of hypertension.  He has never smoked either.  He is not a known diabetic.  I had  actually set him up for some blood work to do a fasting cholesterol panel and also his sugar check. Note that he does not have any known history of hyperlipidemia.  He does not recall when his last cholesterol was.  He says that it was a while back.  ALLERGIES:  PENICILLIN.  When he had taken a very large dose, he apparently passed out, but he has taken amoxicillin without any problems.  As of yesterday, I am calling a possible reaction to TESSALON PERLES.  CURRENT MEDICATIONS:  He gets weekly allergy shots and also Claritin on a p.r.n. basis.  Aside from that, he was on the Biaxin XL two tablets p.o. q.d. for a week that started on May 28, 2001, which he has subsequently stopped. The [employer] as mentioned in the HPI and also the Robitussin DM as mentioned in the HPI and the Ventolin HFA mentioned in the HPI, all related to his current illness.  PAST MEDICAL HISTORY:  Really only significant for allergic rhinitis.  He denied any diabetes mellitus, hypertension, coronary artery disease, or history of hyperlipidemia.  PAST SURGICAL HISTORY: 1. He has had a left bunionectomy on the first MTP and this joint was    replaced. 2. Wisdom teeth extraction and most of his teeth, except for three teeth that    are still remaining. 3. He has also had a T&A.  SOCIAL HISTORY:  He has been married for the past 21 years, his first and only marriage.  He has a child who is 65 years old.  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  He is originally from [place], [place].  Education Level: Two years of Scientist, product/process development college.  He does not smoke and has never smoked.  He does not really drink much alcohol either.  His last alcoholic beverage was more than 10 years ago.  HEALTH MAINTENANCE ASSESSMENT:  His last tetanus shot was in 1996.  He never has received a flu vaccine.  FAMILY HISTORY:  There is diabetes mellitus in  his maternal grandmother.  Lung cancer in his father who died at the age of 87, who was a horrible smoker. Emphysema in his father also.  His mother had metastatic lung CA.  She was  also a smoker and she died at the age of 55.  No known colon, breast, prostate, or ovarian cancer, although he states that his paternal uncle and paternal aunt had some kind of cancer and they both died of that.  There is hyperlipidemia in a sister and coronary artery disease in the same sister with the hyperlipidemia.  She was also a horrible smoker.  There is a history of asthma and emphysema in his sister also.  He has three sisters, ages 82, 46, and 14, and one brother who is 71.  REVIEW  OF SYSTEMS:  As per HPI.  Otherwise he had noted some nocturia and some trouble starting his flow.  That has been going on over the past two years, but otherwise he denies any melena, hematochezia, and any change in bowel habits.  The rest are truly as per HPI.  PHYSICAL EXAMINATION:  Today he looks malaised compared to when I saw him yesterday.  VITAL SIGNS:  His blood pressure is 90/70 and that is using a large cuff, respiratory rate of 20, and pulse of 60 peripherally, although it is very difficult to palpate on the right side.  WEIGHT:  195 pounds.  HEENT:  Fairly unremarkable, except for the oropharynx.  He has reddish patches on the soft palate.  He has dentures in.  He has some postnasal drip.  NECK:  There is no significant JVD.  He is able to lay fairly flat without any problems.  LUNGS:  He has decreased breath sounds at the bases, which is different from what I heard yesterday, which was primarily mild bibasilar crackles.  HEART:  Very tachycardia.  As to whether it sounds irregular rather than tachy with skipped beats or extra beats.  The left radial pulse is very strong.  The right radial pulse is quite weak actually.  I had a hard time palpating it.  I think it is less than 1+.  He had carotid  pulses 2+ bilaterally yesterday.  ABDOMEN:  Bowel sounds are normal.  Soft and nontender.  No organomegaly.  EXTREMITIES:  In the lower extremities, he has no significant edema.  NEUROLOGIC:  He is alert and oriented.  The cranial nerves II-XII are intact on confrontation.  This is very yesterdays exam.  DTRs were normal also.  As mentioned earlier, his EKG showed fibrillation/flutter with rapid ventricular rate, although I could see some P waves.  He does have some T-wave inversion in his lateral leads and this certainly good be rate related.  LABORATORY DATA:  The chest x-ray shows significant cardiomegaly and a widened mediastinum.  As to whether he might have some air bronchograms on the right side is a possibility.  He certainly has blunting of bilateral costophrenic angle.  ASSESSMENT AND PLAN: 1. Pulmonary edema/congestive heart failure with an enlarged heart with    question of new onset of atrial fibrillation/flutter with superiposed    pneumonia.  I am going to go ahead and admit him to telemetry.  Will rule    him out by cardiac enzymes for myocardial infarction.  Will go ahead and    start him on the heparin protocol for the atrial fibrillation.  Will    diurese him,  place him on aspirin, and start a Cardizem drip for his    atrial fibrillation.  Will obtain a 2-D echocardiogram and also a thoracic    aortic ultrasound to see whether he has any abnormality in his upper    extremity flow blood supply.  Will also go head and get cardiology involved    secondary to the fact that he does have a significantly enlarged heart. 2. In regards to his widened mediastinum and lack of right radial pulse, I    have gone ahead and ordered a CT scan of his chest with contrast and a    thoracic aortic ultrasound. 3. In regards to his possible pneumonia, will start him on ceftriaxone and    doxycycline and place him on some Humibid LA and see how he does.  Janora Norlander, N.P.,  was paged and notified of the patients admission and she will check up on labs and get cardiology involved once he gets into the hospital. Dictated by:   Claretta Fraise, M.D. [facility] Attending Physician:  Rollene Rotunda DD:  05/29/01 TD:  05/29/01 Job: [id number]

## 2010-09-10 ENCOUNTER — Ambulatory Visit (INDEPENDENT_AMBULATORY_CARE_PROVIDER_SITE_OTHER): Payer: Managed Care, Other (non HMO) | Admitting: Internal Medicine

## 2010-09-10 ENCOUNTER — Encounter: Payer: Self-pay | Admitting: Internal Medicine

## 2010-09-10 VITALS — BP 120/72 | HR 46 | Ht 68.0 in | Wt 197.8 lb

## 2010-09-10 DIAGNOSIS — I428 Other cardiomyopathies: Secondary | ICD-10-CM

## 2010-09-10 DIAGNOSIS — I4891 Unspecified atrial fibrillation: Secondary | ICD-10-CM

## 2010-09-10 NOTE — Assessment & Plan Note (Signed)
No CHF on exam Consider repeat echo upon return in 6 months.

## 2010-09-10 NOTE — Progress Notes (Signed)
Addended by: Dennis Bast F on: 09/10/2010 10:38 AM   Modules accepted: Orders

## 2010-09-10 NOTE — Progress Notes (Signed)
The patient presents today for routine electrophysiology followup.  Since last being seen in our clinic, he reports doing very well.  He has occasional palpitations, which he descibes as  "skipped" beats but does not feel that he has had any further afib since his ablation. He has done well since his ablation. Today, he denies symptoms of chest pain, shortness of breath, orthopnea, PND, lower extremity edema, dizziness, presyncope, syncope, or neurologic sequela.  The patient feels that he is tolerating medications without difficulties and is otherwise without complaint today.   Past Medical History  Diagnosis Date  . Persistent atrial fibrillation     s/p afib ablation 02/13/10  . Atrial flutter     s/p CTI ablation 2006 by Ladona Ridgel  . Sleep apnea   . Non-ischemic cardiomyopathy     EF 40%   Past Surgical History  Procedure Date  . Atrial ablation surgery 2006    CTI ablation  . Joint replacement     toe joint  . Tonsillectomy   . Atrial fibrillation ablation 02/13/10    Current outpatient prescriptions:amiodarone (PACERONE) 200 MG tablet, Take 0.5 tablets (100 mg total) by mouth daily., Disp: , Rfl: ;  carvedilol (COREG) 12.5 MG tablet, Take 12.5 mg by mouth 2 (two) times daily with a meal.  , Disp: , Rfl: ;  dabigatran (PRADAXA) 150 MG CAPS, Take 1 capsule (150 mg total) by mouth 2 (two) times daily., Disp: 60 capsule, Rfl: 6 ibuprofen (ADVIL,MOTRIN) 200 MG tablet, Take 200 mg by mouth every 6 (six) hours as needed.  , Disp: , Rfl: ;  ramipril (ALTACE) 5 MG capsule, Take 1 capsule (5 mg total) by mouth 2 (two) times daily., Disp: 60 capsule, Rfl: 10  Allergies  Allergen Reactions  . Penicillins     REACTION: tolerates amoxicillin    History   Social History  . Marital Status: Married    Spouse Name: N/A    Number of Children: N/A  . Years of Education: N/A   Occupational History  . Not on file.   Social History Main Topics  . Smoking status: Never Smoker   . Smokeless  tobacco: Never Used  . Alcohol Use: No  . Drug Use: No  . Sexually Active: Not on file   Other Topics Concern  . Not on file   Social History Narrative   He is married and lives with his wife in [place].     Family History  Problem Relation Age of Onset  . Cancer Father   . Cancer Mother     ROS-  All systems are reviewed and are negative except as outlined in the HPI above  Physical Exam: Filed Vitals:   09/10/10 1003  BP: 120/72  Pulse: 46  Height: 5\' 8"  (1.727 m)  Weight: 197 lb 12.8 oz (89.721 kg)    GEN- The patient is well appearing, alert and oriented x 3 today.   Head- normocephalic, atraumatic Eyes-  Sclera clear, conjunctiva pink Ears- hearing intact Oropharynx- clear Neck- supple, no JVP Lymph- no cervical lymphadenopathy Lungs- Clear to ausculation bilaterally, normal work of breathing Heart- Regular rate and rhythm with frequent ectopy, no murmurs, rubs or gallops, PMI not laterally displaced GI- soft, NT, ND, + BS Extremities- no clubbing, cyanosis, or edema MS- no significant deformity or atrophy Skin- no rash or lesion Psych- euthymic mood, full affect Neuro- strength and sensation are intact  EKG- sinus bradycardia 46 bpm, nonspecific ST/T changes are unchanged from 06/09/10.

## 2010-09-10 NOTE — Patient Instructions (Signed)
Your physician wants you to follow-up in: 6 months with Dr. Allred. You will receive a reminder letter in the mail two months in advance. If you don't receive a letter, please call our office to schedule the follow-up appointment.  

## 2010-09-10 NOTE — Assessment & Plan Note (Signed)
Doing well s/p ablation Stop amiodarone Continue pradaxa.

## 2010-09-16 ENCOUNTER — Other Ambulatory Visit: Payer: Self-pay | Admitting: *Deleted

## 2010-09-16 MED ORDER — CARVEDILOL 12.5 MG PO TABS: 12.5000 mg | ORAL_TABLET | Freq: Two times a day (BID) | ORAL | Status: DC

## 2010-10-18 NOTE — Progress Notes (Signed)
Addended by: Hillis Range on: 10/18/2010 02:18 PM   Modules accepted: Orders

## 2011-02-14 ENCOUNTER — Ambulatory Visit: Payer: Managed Care, Other (non HMO) | Admitting: Internal Medicine

## 2011-02-17 ENCOUNTER — Encounter: Payer: Self-pay | Admitting: Internal Medicine

## 2011-02-17 ENCOUNTER — Ambulatory Visit (INDEPENDENT_AMBULATORY_CARE_PROVIDER_SITE_OTHER): Payer: Managed Care, Other (non HMO) | Admitting: Internal Medicine

## 2011-02-17 VITALS — BP 120/62 | HR 71 | Resp 18 | Ht 67.0 in | Wt 195.0 lb

## 2011-02-17 DIAGNOSIS — I428 Other cardiomyopathies: Secondary | ICD-10-CM

## 2011-02-17 DIAGNOSIS — I4891 Unspecified atrial fibrillation: Secondary | ICD-10-CM

## 2011-02-17 MED ORDER — CARVEDILOL 25 MG PO TABS: 25.0000 mg | ORAL_TABLET | Freq: Two times a day (BID) | ORAL | Status: DC

## 2011-02-17 MED ORDER — CARVEDILOL 25 MG PO TABS: 12.5000 mg | ORAL_TABLET | Freq: Two times a day (BID) | ORAL | Status: DC

## 2011-02-17 NOTE — Assessment & Plan Note (Signed)
Previously improved with sinus rhythm We will need to repeat an echo to assess LV function with rate control upon return.

## 2011-02-17 NOTE — Assessment & Plan Note (Signed)
The patient has returned to afib off of amiodarone. He has done well for a year s/p ablation. At this time, I will increase coreg to 25mg  BID for better rate control. He is appropriately anticoagulated with pradaxa. He would like to avoid AAD or ablation presently. As he is minimally symptomatic, we will plan rate control as long as he tolerates this strategy.  He will return in 4-6 weeks.

## 2011-02-17 NOTE — Progress Notes (Signed)
The patient presents today for routine electrophysiology followup.  Since last being seen in our clinic, he reports doing very well. He has done well since his ablation.  He is unaware of significant symptoms of afib off of amiodarone. Today, he denies symptoms of chest pain, shortness of breath, orthopnea, PND, lower extremity edema, dizziness, presyncope, syncope, or neurologic sequela.  The patient feels that he is tolerating medications without difficulties and is otherwise without complaint today.   Past Medical History  Diagnosis Date  . Persistent atrial fibrillation     s/p afib ablation 02/13/10  . Atrial flutter     s/p CTI ablation 2006 by Ladona Ridgel  . Sleep apnea   . Non-ischemic cardiomyopathy     EF 40%   Past Surgical History  Procedure Date  . Atrial ablation surgery 2006    CTI ablation  . Joint replacement     toe joint  . Tonsillectomy   . Atrial fibrillation ablation 02/13/10    Current outpatient prescriptions:carvedilol (COREG) 25 MG tablet, Take 1 tablet (25 mg total) by mouth 2 (two) times daily with a meal., Disp: 60 tablet, Rfl: 6;  dabigatran (PRADAXA) 150 MG CAPS, Take 1 capsule (150 mg total) by mouth 2 (two) times daily., Disp: 60 capsule, Rfl: 6;  ramipril (ALTACE) 5 MG capsule, Take 1 capsule (5 mg total) by mouth 2 (two) times daily., Disp: 60 capsule, Rfl: 10  Allergies  Allergen Reactions  . Penicillins     REACTION: tolerates amoxicillin    History   Social History  . Marital Status: Married    Spouse Name: N/A    Number of Children: N/A  . Years of Education: N/A   Occupational History  . Not on file.   Social History Main Topics  . Smoking status: Never Smoker   . Smokeless tobacco: Never Used  . Alcohol Use: No  . Drug Use: No  . Sexually Active: Not on file   Other Topics Concern  . Not on file   Social History Narrative   He is married and lives with his wife in [place].     Family History  Problem Relation Age of Onset    . Cancer Father   . Cancer Mother     ROS-  All systems are reviewed and are negative except as outlined in the HPI above  Physical Exam: Filed Vitals:   02/17/11 1553  BP: 120/62  Pulse: 71  Resp: 18  Height: 5\' 7"  (1.702 m)  Weight: 195 lb (88.451 kg)    GEN- The patient is well appearing, alert and oriented x 3 today.   Head- normocephalic, atraumatic Eyes-  Sclera clear, conjunctiva pink Ears- hearing intact Oropharynx- clear Neck- supple, no JVP Lymph- no cervical lymphadenopathy Lungs- Clear to ausculation bilaterally, normal work of breathing Heart- irregular rate and rhythm,no murmurs, rubs or gallops, PMI not laterally displaced GI- soft, NT, ND, + BS Extremities- no clubbing, cyanosis, or edema MS- no significant deformity or atrophy Skin- no rash or lesion Psych- euthymic mood, full affect Neuro- strength and sensation are intact  EKG- coarse afib, V rate 117 bpm, nonspecific ST/T changes

## 2011-02-17 NOTE — Patient Instructions (Signed)
Your physician recommends that you schedule a follow-up appointment in: 4 weeks with Dr Johney Frame  Your physician has recommended you make the following change in your medication:  1) Increase your Carvedilol to 25mg  twice daily

## 2011-03-03 ENCOUNTER — Telehealth: Payer: Self-pay | Admitting: Internal Medicine

## 2011-03-03 DIAGNOSIS — I4891 Unspecified atrial fibrillation: Secondary | ICD-10-CM

## 2011-03-03 MED ORDER — CARVEDILOL 25 MG PO TABS: 25.0000 mg | ORAL_TABLET | Freq: Two times a day (BID) | ORAL | Status: DC

## 2011-03-03 MED ORDER — DABIGATRAN ETEXILATE MESYLATE 150 MG PO CAPS: 150.0000 mg | ORAL_CAPSULE | Freq: Two times a day (BID) | ORAL | Status: DC

## 2011-03-03 NOTE — Telephone Encounter (Signed)
FU Call: Pt returning call to [facility] regarding medication pt is taking. Please return pt call to discuss further.

## 2011-03-03 NOTE — Telephone Encounter (Signed)
Just needs new rx called into pharamcy

## 2011-04-01 ENCOUNTER — Encounter: Payer: Self-pay | Admitting: *Deleted

## 2011-04-04 ENCOUNTER — Encounter: Payer: Self-pay | Admitting: Internal Medicine

## 2011-04-04 ENCOUNTER — Ambulatory Visit (INDEPENDENT_AMBULATORY_CARE_PROVIDER_SITE_OTHER): Payer: Managed Care, Other (non HMO) | Admitting: Internal Medicine

## 2011-04-04 VITALS — BP 106/71 | HR 45 | Resp 18 | Ht 67.0 in | Wt 199.0 lb

## 2011-04-04 DIAGNOSIS — I428 Other cardiomyopathies: Secondary | ICD-10-CM

## 2011-04-04 DIAGNOSIS — I4891 Unspecified atrial fibrillation: Secondary | ICD-10-CM

## 2011-04-04 NOTE — Assessment & Plan Note (Signed)
EF previously improved He declines echo today but is willing to have this done upon return.

## 2011-04-04 NOTE — Patient Instructions (Signed)
Your physician wants you to follow-up in: 4 months with Dr. Johney Frame. You will receive a reminder letter in the mail two months in advance. If you don't receive a letter, please call our office to schedule the follow-up appointment.  Your physician recommends that you continue on your current medications as directed. Please refer to the Current Medication list given to you today.

## 2011-04-04 NOTE — Progress Notes (Signed)
The patient presents today for routine electrophysiology followup.  Since last being seen in our clinic, he reports doing very well. He feels well at this time.  He feels that he has been in sinus rhythm since his last visit to our clinic.  He checks his pulse frequently.  Today, he denies symptoms of chest pain, shortness of breath, orthopnea, PND, lower extremity edema, dizziness, presyncope, syncope, or neurologic sequela.  The patient feels that he is tolerating medications without difficulties and is otherwise without complaint today.   Past Medical History  Diagnosis Date  . Persistent atrial fibrillation     s/p afib ablation 02/13/10  . Atrial flutter     s/p CTI ablation 2006 by Ladona Ridgel  . Sleep apnea   . Non-ischemic cardiomyopathy     EF 40%  . ALLERGIC RHINITIS    Past Surgical History  Procedure Date  . Atrial ablation surgery 2006    CTI ablation  . Joint replacement     toe joint  . Tonsillectomy   . Atrial fibrillation ablation 02/13/10    Current outpatient prescriptions:carvedilol (COREG) 25 MG tablet, Take 1 tablet (25 mg total) by mouth 2 (two) times daily with a meal., Disp: 60 tablet, Rfl: 6;  dabigatran (PRADAXA) 150 MG CAPS, Take 1 capsule (150 mg total) by mouth 2 (two) times daily., Disp: 60 capsule, Rfl: 6;  ramipril (ALTACE) 5 MG capsule, Take 1 capsule (5 mg total) by mouth 2 (two) times daily., Disp: 60 capsule, Rfl: 10  Allergies  Allergen Reactions  . Penicillins     REACTION: tolerates amoxicillin    History   Social History  . Marital Status: Married    Spouse Name: N/A    Number of Children: N/A  . Years of Education: N/A   Occupational History  . Not on file.   Social History Main Topics  . Smoking status: Never Smoker   . Smokeless tobacco: Never Used  . Alcohol Use: No  . Drug Use: No  . Sexually Active: Not on file   Other Topics Concern  . Not on file   Social History Narrative   He is married and lives with his wife in  [place].     Family History  Problem Relation Age of Onset  . Cancer Father   . Cancer Mother    Physical Exam: Filed Vitals:   04/04/11 1548  BP: 106/71  Pulse: 45  Resp: 18  Height: 5\' 7"  (1.702 m)  Weight: 199 lb (90.266 kg)    GEN- The patient is well appearing, alert and oriented x 3 today.   Head- normocephalic, atraumatic Eyes-  Sclera clear, conjunctiva pink Ears- hearing intact Oropharynx- clear Neck- supple, no JVP Lymph- no cervical lymphadenopathy Lungs- Clear to ausculation bilaterally, normal work of breathing Heart- regular rate and rhythm with frequent ectopy,no murmurs, rubs or gallops, PMI not laterally displaced GI- soft, NT, ND, + BS Extremities- no clubbing, cyanosis, or edema MS- no significant deformity or atrophy Skin- no rash or lesion Psych- euthymic mood, full affect Neuro- strength and sensation are intact  EKG today reveals sinus bradycardia with frequent pacs, at times in a bigeminal pattern

## 2011-04-04 NOTE — Assessment & Plan Note (Signed)
He has returned to sinus rhythm since his last visit and is doing well. He has PACS in a bigeminal pattern.  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 I have recommended repeat catheter ablation at this time, however he would like to avoid the procedure for now. As we have had a very good response to ablation, I would like to try to maintain our results long term, possibly with a repeat procedure if needed. Per the patient's request, we will continue watchful waiting for now.  He is instructed to contact my office should he return to persistent afib. Continue coreg for rate control.

## 2011-05-04 ENCOUNTER — Other Ambulatory Visit: Payer: Self-pay | Admitting: Internal Medicine

## 2011-05-04 DIAGNOSIS — I4891 Unspecified atrial fibrillation: Secondary | ICD-10-CM

## 2011-05-04 NOTE — Telephone Encounter (Signed)
New Refill   Patient reports insurance has changed, there fore all RX Scripts have to filled via [facility] [address] [place] w/ 3 month prescription.  If there are any additional questions please return call to patient at hm# [phone]

## 2011-05-05 MED ORDER — DABIGATRAN ETEXILATE MESYLATE 150 MG PO CAPS: 150.0000 mg | ORAL_CAPSULE | Freq: Two times a day (BID) | ORAL | Status: DC

## 2011-05-05 MED ORDER — RAMIPRIL 5 MG PO CAPS: 5.0000 mg | ORAL_CAPSULE | Freq: Two times a day (BID) | ORAL | Status: DC

## 2011-05-05 MED ORDER — CARVEDILOL 25 MG PO TABS: 25.0000 mg | ORAL_TABLET | Freq: Two times a day (BID) | ORAL | Status: DC

## 2011-05-05 NOTE — Telephone Encounter (Signed)
Refilled medication

## 2012-07-19 ENCOUNTER — Other Ambulatory Visit: Payer: Self-pay

## 2012-07-19 MED ORDER — RAMIPRIL 5 MG PO CAPS: 5.0000 mg | ORAL_CAPSULE | Freq: Two times a day (BID) | ORAL | Status: DC

## 2012-07-19 MED ORDER — CARVEDILOL 25 MG PO TABS: 25.0000 mg | ORAL_TABLET | Freq: Two times a day (BID) | ORAL | Status: DC

## 2012-07-27 ENCOUNTER — Telehealth: Payer: Self-pay | Admitting: Internal Medicine

## 2012-07-27 ENCOUNTER — Telehealth: Payer: Self-pay | Admitting: Cardiology

## 2012-07-27 ENCOUNTER — Other Ambulatory Visit: Payer: Self-pay | Admitting: Emergency Medicine

## 2012-07-27 DIAGNOSIS — I4891 Unspecified atrial fibrillation: Secondary | ICD-10-CM

## 2012-07-27 MED ORDER — DABIGATRAN ETEXILATE MESYLATE 150 MG PO CAPS: 150.0000 mg | ORAL_CAPSULE | Freq: Two times a day (BID) | ORAL | Status: DC

## 2012-07-27 NOTE — Telephone Encounter (Signed)
LEFT PATIENT A MESSAGE STATING THAT HE NEEDS A FOLLOW UP VISIT FOR FURTHER REFILLS

## 2012-07-27 NOTE — Telephone Encounter (Signed)
Spoke with patient and let him know that his medications have been ready and waiting for him at the pharmacy since 07/20/11.  He is upset that he can only get a 1 month supply.  I have explained to him that he is overdue for his appointment and I can give him the 30 day supply until he has his appointment on the 13th.  He says he isn't going to pick up the medications until after his appointment so he can get a 90 day supply.  I let him know he needed to pick up his medications as they have been ready since 07/19/12 and when he comes to visit on the 13th I will be glad to give him a 90 supply at that time.  He says he will wait until his appointment.  I reiterated the importance of continuing the medications and keeping his appointments

## 2012-07-27 NOTE — Telephone Encounter (Signed)
New Prob      Has some concerned regarding his refill. States a mix up occurred and would like to speak to nurse.

## 2012-07-27 NOTE — Telephone Encounter (Signed)
Pt called stating he needs 3 month supply of his medications for his insurance to pay for them ( Ramipril, Pradaxa, and Carvedilol) I told him since he i past due for his appt I will have to sendthis message to the nurse and she will either fill it or not.

## 2012-07-27 NOTE — Telephone Encounter (Signed)
Ok to fill for 1 month and will give him a 3 months at his office visit

## 2012-08-10 ENCOUNTER — Encounter: Payer: Self-pay | Admitting: Internal Medicine

## 2012-08-10 ENCOUNTER — Ambulatory Visit (INDEPENDENT_AMBULATORY_CARE_PROVIDER_SITE_OTHER): Payer: Managed Care, Other (non HMO) | Admitting: Internal Medicine

## 2012-08-10 VITALS — BP 118/72 | HR 113 | Ht 67.0 in | Wt 201.0 lb

## 2012-08-10 VITALS — BP 124/84 | HR 111 | Temp 97.9°F | Wt 201.0 lb

## 2012-08-10 DIAGNOSIS — K645 Perianal venous thrombosis: Secondary | ICD-10-CM

## 2012-08-10 DIAGNOSIS — I4891 Unspecified atrial fibrillation: Secondary | ICD-10-CM

## 2012-08-10 DIAGNOSIS — I428 Other cardiomyopathies: Secondary | ICD-10-CM

## 2012-08-10 MED ORDER — LIDOCAINE HCL 2 % EX GEL: CUTANEOUS | Status: DC | PRN

## 2012-08-10 MED ORDER — RAMIPRIL 5 MG PO CAPS: 5.0000 mg | ORAL_CAPSULE | Freq: Two times a day (BID) | ORAL | Status: DC

## 2012-08-10 MED ORDER — CARVEDILOL 25 MG PO TABS: 25.0000 mg | ORAL_TABLET | Freq: Two times a day (BID) | ORAL | Status: DC

## 2012-08-10 MED ORDER — DABIGATRAN ETEXILATE MESYLATE 150 MG PO CAPS: 150.0000 mg | ORAL_CAPSULE | Freq: Two times a day (BID) | ORAL | Status: DC

## 2012-08-10 NOTE — Patient Instructions (Addendum)
Your physician has requested that you have an echocardiogram. Echocardiography is a painless test that uses sound waves to create images of your heart. It provides your doctor with information about the size and shape of your heart and how well your heart's chambers and valves are working. This procedure takes approximately one hour. There are no restrictions for this procedure.  Your physician recommends that you schedule a follow-up appointment in: 6 weeks with Dr. Johney Frame.

## 2012-08-10 NOTE — Progress Notes (Signed)
The patient presents today for routine electrophysiology followup.  Since last being seen in our clinic, he reports doing reasonably well.  He has been noncompliant with office follow-up.  He feels that he has returned to afib.  He reports symptoms of palpitations and fatigue.  Today, he denies symptoms of chest pain, shortness of breath, orthopnea, PND, lower extremity edema, dizziness, presyncope, syncope, or neurologic sequela.  The patient feels that he is tolerating medications without difficulties and is otherwise without complaint today.   Past Medical History  Diagnosis Date  . Persistent atrial fibrillation     s/p afib ablation 02/13/10  . Atrial flutter     s/p CTI ablation 2006 by Ladona Ridgel  . Sleep apnea   . Non-ischemic cardiomyopathy     EF 40%  . ALLERGIC RHINITIS    Past Surgical History  Procedure Laterality Date  . Atrial ablation surgery  2006    CTI ablation  . Joint replacement      toe joint  . Tonsillectomy    . Atrial fibrillation ablation  02/13/10    Current outpatient prescriptions:carvedilol (COREG) 25 MG tablet, Take 1 tablet (25 mg total) by mouth 2 (two) times daily with a meal., Disp: 180 tablet, Rfl: 3;  dabigatran (PRADAXA) 150 MG CAPS, Take 1 capsule (150 mg total) by mouth 2 (two) times daily., Disp: 180 capsule, Rfl: 3;  ramipril (ALTACE) 5 MG capsule, Take 1 capsule (5 mg total) by mouth 2 (two) times daily., Disp: 180 capsule, Rfl: 3  Allergies  Allergen Reactions  . Penicillins     REACTION: tolerates amoxicillin    History   Social History  . Marital Status: Married    Spouse Name: N/A    Number of Children: N/A  . Years of Education: N/A   Occupational History  . Not on file.   Social History Main Topics  . Smoking status: Never Smoker   . Smokeless tobacco: Never Used  . Alcohol Use: No  . Drug Use: No  . Sexually Active: Not on file   Other Topics Concern  . Not on file   Social History Narrative   He is married and lives  with his wife in [place].     Family History  Problem Relation Age of Onset  . Cancer Father   . Cancer Mother    Physical Exam: Filed Vitals:   08/10/12 1021  BP: 118/72  Pulse: 113  Height: 5\' 7"  (1.702 m)  Weight: 201 lb (91.173 kg)    GEN- The patient is well appearing, alert and oriented x 3 today.   Head- normocephalic, atraumatic Eyes-  Sclera clear, conjunctiva pink Ears- hearing intact Oropharynx- clear Neck- supple, no JVP Lymph- no cervical lymphadenopathy Lungs- Clear to ausculation bilaterally, normal work of breathing Heart- regular rate and rhythm with frequent ectopy,no murmurs, rubs or gallops, PMI not laterally displaced GI- soft, NT, ND, + BS Extremities- no clubbing, cyanosis, or edema MS- no significant deformity or atrophy Skin- no rash or lesion Psych- euthymic mood, full affect Neuro- strength and sensation are intact  EKG today reveals afib, V rate 110s, LAHB, nonspecific ST/T changes    A/P 1. Persistent afib Recurrent post ablation Compliance with coreg advised Echo to assess LA size Consider convergent procedure or maze as a potential next step pending results of the echo  2. Chronic systolic dysfunction Continue medical therapy euvolemic today Echo  Return in 6 weeks to discuss results of echo and further treatment options for  afib

## 2012-08-10 NOTE — Assessment & Plan Note (Signed)
3 days history of thrombosed hemorrhoid, on Pradaxa. Is already getting slightly better. I spoke with the surgeon on call Dr. Maisie Fus, we agreed that this could be handled conservatively, if needed they  could do a I&D. See instructions.

## 2012-08-10 NOTE — Progress Notes (Signed)
  Subjective:    Patient ID: [name], male    DOB: Nov 05, 1955, 57 y.o.   MRN: [id number]  HPI Acute visit, new patient, last visit more than 3 years ago. Complained of pain and swelling "due to a hemorrhoid" for the last 3 days. Has been using preparation H.,  slightly better but Still hurts to a scale of 6/10 but improving in the last 24 h.  Denies bleeding. Had similar episodes like this one before, they usually self resolve within a day or 2  Past Medical History  Diagnosis Date  . Persistent atrial fibrillation     s/p afib ablation 02/13/10  . Atrial flutter     s/p CTI ablation 2006 by Ladona Ridgel  . Sleep apnea   . Non-ischemic cardiomyopathy     EF 40%  . ALLERGIC RHINITIS    Past Surgical History  Procedure Laterality Date  . Atrial ablation surgery  2006    CTI ablation  . Joint replacement      toe joint  . Tonsillectomy    . Atrial fibrillation ablation  02/13/10   History   Social History  . Marital Status: Married    Spouse Name: N/A    Number of Children: 1  . 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 Smoking status: Never Smoker   . Smokeless tobacco: Never Used  . Alcohol Use: No  . Drug Use: No  . Sexually Active: Not on file   Other Topics Concern  . Not on file   Social History Narrative   He is married and lives with his wife in [place].    Family History  Problem Relation Age of Onset  . Lung cancer Father     smoker  . Lung cancer Mother     smoker  . Diabetes Neg Hx   . Prostate cancer Neg Hx   . Colon cancer Neg Hx     Review of Systems No fever chills No nausea, vomiting, diarrhea. No blood in the stools. He is taking a stool softener. Bowel movements normal and daily    Objective:   Physical Exam  General -- alert, well-developed, NAD.    Abdomen--soft, non-tender, no distention, no masses, no HSM, no guarding, and no rigidity.   Extremities-- no pretibial  edema bilaterally Rectal-- Has a 1.5 cm external thrombosed hemorrhoid, moderately tender, soft, no  bleeding. Perirectal area is not red or swollen. Digital rectal exam otherwise showed no mass or internal problems.  Neurologic-- alert & oriented X3 and strength normal in all extremities. Psych-- Cognition and judgment appear intact. Alert and cooperative with normal attention span and concentration.  not anxious appearing and not depressed appearing.          Assessment & Plan:

## 2012-08-10 NOTE — Patient Instructions (Addendum)
Bat sitz OTC nupercainal as needed (or lidocaine ) Colace OTC daily ER if severe pain, increase swelling, bleeding , fever, constipation. Call if not improved by Monday or Tuesday    Hemorrhoids Hemorrhoids are swollen veins around the rectum or anus. There are two types of hemorrhoids:   Internal hemorrhoids. These occur in the veins just inside the rectum. They may poke through to the outside and become irritated and painful.  External hemorrhoids. These occur in the veins outside the anus and can be felt as a painful swelling or hard lump near the anus. CAUSES  Pregnancy.   Obesity.   Constipation or diarrhea.   Straining to have a bowel movement.   Sitting for long periods on the toilet.  Heavy lifting or other activity that caused you to strain.  Anal intercourse. SYMPTOMS   Pain.   Anal itching or irritation.   Rectal bleeding.   Fecal leakage.   Anal swelling.   One or more lumps around the anus.  DIAGNOSIS  Your caregiver may be able to diagnose hemorrhoids by visual examination. Other examinations or tests that may be performed include:   Examination of the rectal area with a gloved hand (digital rectal exam).   Examination of anal canal using a small tube (scope).   A blood test if you have lost a significant amount of blood.  A test to look inside the colon (sigmoidoscopy or colonoscopy). TREATMENT Most hemorrhoids can be treated at home. However, if symptoms do not seem to be getting better or if you have a lot of rectal bleeding, your caregiver may perform a procedure to help make the hemorrhoids get smaller or remove them completely. Possible treatments include:   Placing a rubber band at the base of the hemorrhoid to cut off the circulation (rubber band ligation).   Injecting a chemical to shrink the hemorrhoid (sclerotherapy).   Using a tool to burn the hemorrhoid (infrared light therapy).   Surgically removing the hemorrhoid  (hemorrhoidectomy).   Stapling the hemorrhoid to block blood flow to the tissue (hemorrhoid stapling).  HOME CARE INSTRUCTIONS   Eat foods with fiber, such as whole grains, beans, nuts, fruits, and vegetables. Ask your doctor about taking products with added fiber in them (fibersupplements).  Increase fluid intake. Drink enough water and fluids to keep your urine clear or pale yellow.   Exercise regularly.   Go to the bathroom when you have the urge to have a bowel movement. Do not wait.   Avoid straining to have bowel movements.   Keep the anal area dry and clean. Use wet toilet paper or moist towelettes after a bowel movement.   Medicated creams and suppositories may be used or applied as directed.   Only take over-the-counter or prescription medicines as directed by your caregiver.   Take warm sitz baths for 15 20 minutes, 3 4 times a day to ease pain and discomfort.   Place ice packs on the hemorrhoids if they are tender and swollen. Using ice packs between sitz baths may be helpful.   Put ice in a plastic bag.   Place a towel between your skin and the bag.   Leave the ice on for 15 20 minutes, 3 4 times a day.   Do not use a donut-shaped pillow or sit on the toilet for long periods. This increases blood pooling and pain.  SEEK MEDICAL CARE IF:  You have increasing pain and swelling that is not controlled by treatment or medicine.  You have uncontrolled bleeding.  You have difficulty or you are unable to have a bowel movement.  You have pain or inflammation outside the area of the hemorrhoids. MAKE SURE YOU:  Understand these instructions.  Will watch your condition.  Will get help right away if you are not doing well or get worse. Document Released: 02/12/2000 Document Revised: 02/01/2012 Document Reviewed: 12/20/2011 [facility] Patient Information 2014 [place], [place].

## 2012-08-11 ENCOUNTER — Encounter: Payer: Self-pay | Admitting: Internal Medicine

## 2012-08-24 ENCOUNTER — Ambulatory Visit (HOSPITAL_COMMUNITY): Payer: Managed Care, Other (non HMO) | Attending: Internal Medicine

## 2012-08-24 DIAGNOSIS — I4891 Unspecified atrial fibrillation: Secondary | ICD-10-CM | POA: Insufficient documentation

## 2012-08-24 DIAGNOSIS — R002 Palpitations: Secondary | ICD-10-CM | POA: Insufficient documentation

## 2012-08-24 DIAGNOSIS — I428 Other cardiomyopathies: Secondary | ICD-10-CM | POA: Insufficient documentation

## 2012-08-24 DIAGNOSIS — R5381 Other malaise: Secondary | ICD-10-CM | POA: Insufficient documentation

## 2012-08-24 NOTE — Progress Notes (Signed)
Echocardiogram performed.  

## 2012-10-08 ENCOUNTER — Ambulatory Visit (INDEPENDENT_AMBULATORY_CARE_PROVIDER_SITE_OTHER): Payer: Managed Care, Other (non HMO) | Admitting: Internal Medicine

## 2012-10-08 ENCOUNTER — Encounter: Payer: Self-pay | Admitting: *Deleted

## 2012-10-08 ENCOUNTER — Encounter: Payer: Self-pay | Admitting: Internal Medicine

## 2012-10-08 VITALS — BP 112/82 | HR 112 | Ht 66.5 in | Wt 198.0 lb

## 2012-10-08 DIAGNOSIS — I428 Other cardiomyopathies: Secondary | ICD-10-CM

## 2012-10-08 DIAGNOSIS — I519 Heart disease, unspecified: Secondary | ICD-10-CM

## 2012-10-08 DIAGNOSIS — I4891 Unspecified atrial fibrillation: Secondary | ICD-10-CM

## 2012-10-08 MED ORDER — AMIODARONE HCL 200 MG PO TABS: 200.0000 mg | ORAL_TABLET | Freq: Two times a day (BID) | ORAL | Status: DC

## 2012-10-08 NOTE — Progress Notes (Signed)
PCP:  Willow Ora, MD  The patient presents today for routine electrophysiology followup.  Since last being seen in our clinic, the patient reports doing very well.  He feels like his rates in afib are relatively well controlled.  He has no exercise intolerance.  He reports fatigue and palpitations with his afib.  His EF has decreased.  Today, he denies symptoms of chest pain, shortness of breath, orthopnea, PND, lower extremity edema, dizziness, presyncope, syncope, or neurologic sequela.  The patient feels that he is tolerating medications without difficulties and is otherwise without complaint today.   Past Medical History  Diagnosis Date  . Persistent atrial fibrillation     s/p afib ablation 02/13/10  . Atrial flutter     s/p CTI ablation 2006 by Ladona Ridgel  . Sleep apnea   . Non-ischemic cardiomyopathy     EF 40%  . ALLERGIC RHINITIS    Past Surgical History  Procedure Laterality Date  . Atrial ablation surgery  2006    CTI ablation  . Joint replacement      toe joint  . Tonsillectomy    . Atrial fibrillation ablation  02/13/10    Current Outpatient Prescriptions  Medication Sig Dispense Refill  . carvedilol (COREG) 25 MG tablet Take 1 tablet (25 mg total) by mouth 2 (two) times daily with a meal.  180 tablet  3  . dabigatran (PRADAXA) 150 MG CAPS Take 1 capsule (150 mg total) by mouth 2 (two) times daily.  180 capsule  3  . ramipril (ALTACE) 5 MG capsule Take 1 capsule (5 mg total) by mouth 2 (two) times daily.  180 capsule  3   No current facility-administered medications for this visit.    Allergies  Allergen Reactions  . Penicillins     REACTION: tolerates amoxicillin    History   Social History  . Marital Status: Married    Spouse Name: N/A    Number of Children: 1  . 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 Smoking status: Never Smoker   . Smokeless tobacco: Never Used  . Alcohol Use: No  . Drug  Use: No  . Sexually Active: Not on file   Other Topics Concern  . Not on file   Social History Narrative   He is married and lives with his wife in [place].     Family History  Problem Relation Age of Onset  . Lung cancer Father     smoker  . Lung cancer Mother     smoker  . Diabetes Neg Hx   . Prostate cancer Neg Hx   . Colon cancer Neg Hx     ROS-  All systems are reviewed and are negative except as outlined in the HPI above  Physical Exam: Filed Vitals:   10/08/12 1418  BP: 112/82  Pulse: 112  Height: 5' 6.5" (1.689 m)  Weight: 198 lb (89.812 kg)    GEN- The patient is well appearing, alert and oriented x 3 today.   Head- normocephalic, atraumatic Eyes-  Sclera clear, conjunctiva pink Ears- hearing intact Oropharynx- clear Neck- supple, no JVP Lymph- no cervical lymphadenopathy Lungs- Clear to ausculation bilaterally, normal work of breathing Heart- irregular rate and rhythm, no murmurs, rubs or gallops, PMI not laterally displaced GI- soft, NT, ND, + BS Extremities- no clubbing, cyanosis, or edema MS- no significant deformity or atrophy Skin- no rash or lesion  Psych- euthymic mood, full affect Neuro- strength and sensation are intact  EKG- atrial fibrillation V rate 112 bpm, nonspecific ST/T changes  Echo 6/14 reveals EF 25%, LA 51mm, mild MR  Assessment and Plan:   1. Persistent afib The patient has symptomatic persistent afib.  He has previously failed medical therapy with amiodarone and tikosyn.  He underwent PVI by me 2012 and had a very good response initially.  Unfortunately, he has developed recurrent afib and with RVR, his EF has decreased. Therapeutic strategies for afib including medicine and ablation were discussed in detail with the patient today.  We discussed convergent procedure (which I have strongly recommended), MAZE, or endovascular PVI.  He is clear that he will not go to [facility] for convergent procedure and does not wish to have a MAZE.   He declines tikosyn or long term amiodarone.  He is willing to take amiodarone for a short period of time and would like to proceed with traditional afib ablation.   Risk, benefits, and alternatives to EP study and radiofrequency ablation for afib were also discussed in detail today. These risks include but are not limited to stroke, bleeding, vascular damage, tamponade, perforation, damage to the esophagus, lungs, and other structures, pulmonary vein stenosis, worsening renal function, and death. The patient understands these risk and wishes to proceed.  We will therefore proceed with catheter ablation at the next available time. I will start amiodarone 200mg  daily at this time.  2. Nonischemic CM/ chronic systolic dysfunction NYHA Class II It is important that he maintain sinus rhythm long term Continue current medical therapy

## 2012-10-08 NOTE — Patient Instructions (Addendum)

## 2012-10-12 ENCOUNTER — Ambulatory Visit: Payer: Managed Care, Other (non HMO) | Admitting: Internal Medicine

## 2012-10-19 ENCOUNTER — Other Ambulatory Visit (INDEPENDENT_AMBULATORY_CARE_PROVIDER_SITE_OTHER): Payer: Managed Care, Other (non HMO)

## 2012-10-19 DIAGNOSIS — I4891 Unspecified atrial fibrillation: Secondary | ICD-10-CM

## 2012-10-19 LAB — BASIC METABOLIC PANEL
CO2: 26 mEq/L (ref 19–32)
Calcium: 8.8 mg/dL (ref 8.4–10.5)
Creatinine, Ser: 1.3 mg/dL (ref 0.4–1.5)
GFR: 58.53 mL/min — ABNORMAL LOW (ref >60.00)
Sodium: 141 mEq/L (ref 135–145)

## 2012-10-19 LAB — CBC WITH DIFFERENTIAL/PLATELET
Basophils Relative: 0.4 % (ref 0.0–3.0)
Eosinophils Relative: 2.7 % (ref 0.0–5.0)
Hemoglobin: 15.6 g/dL (ref 13.0–17.0)
Lymphocytes Relative: 22.9 % (ref 12.0–46.0)
Monocytes Relative: 8.5 % (ref 3.0–12.0)
Neutro Abs: 4.2 10*3/uL (ref 1.4–7.7)
Neutrophils Relative %: 65.5 % (ref 43.0–77.0)
RBC: 5.03 Mil/uL (ref 4.22–5.81)
WBC: 6.4 10*3/uL (ref 4.5–10.5)

## 2012-10-24 ENCOUNTER — Encounter (HOSPITAL_COMMUNITY): Payer: Self-pay | Admitting: Pharmacy Technician

## 2012-10-24 ENCOUNTER — Other Ambulatory Visit: Payer: Self-pay | Admitting: *Deleted

## 2012-10-24 DIAGNOSIS — I4891 Unspecified atrial fibrillation: Secondary | ICD-10-CM

## 2012-10-25 ENCOUNTER — Encounter (HOSPITAL_COMMUNITY)
Admission: RE | Disposition: A | Discharge status: Home / Self Care | Payer: Self-pay | Source: Ambulatory Visit | Attending: Cardiology

## 2012-10-25 ENCOUNTER — Encounter (HOSPITAL_COMMUNITY): Payer: Self-pay

## 2012-10-25 ENCOUNTER — Ambulatory Visit (HOSPITAL_COMMUNITY)
Admission: RE | Admit: 2012-10-25 | Discharge: 2012-10-25 | Disposition: A | Discharge status: Home / Self Care | Payer: Managed Care, Other (non HMO) | Source: Ambulatory Visit | Attending: Cardiology | Admitting: Cardiology

## 2012-10-25 DIAGNOSIS — I4891 Unspecified atrial fibrillation: Secondary | ICD-10-CM | POA: Insufficient documentation

## 2012-10-25 DIAGNOSIS — Z801 Family history of malignant neoplasm of trachea, bronchus and lung: Secondary | ICD-10-CM | POA: Insufficient documentation

## 2012-10-25 DIAGNOSIS — Z7901 Long term (current) use of anticoagulants: Secondary | ICD-10-CM | POA: Insufficient documentation

## 2012-10-25 DIAGNOSIS — I700 Atherosclerosis of aorta: Secondary | ICD-10-CM | POA: Insufficient documentation

## 2012-10-25 DIAGNOSIS — I428 Other cardiomyopathies: Secondary | ICD-10-CM | POA: Insufficient documentation

## 2012-10-25 DIAGNOSIS — I519 Heart disease, unspecified: Secondary | ICD-10-CM | POA: Insufficient documentation

## 2012-10-25 DIAGNOSIS — Z79899 Other long term (current) drug therapy: Secondary | ICD-10-CM | POA: Insufficient documentation

## 2012-10-25 DIAGNOSIS — J309 Allergic rhinitis, unspecified: Secondary | ICD-10-CM | POA: Insufficient documentation

## 2012-10-25 DIAGNOSIS — I059 Rheumatic mitral valve disease, unspecified: Secondary | ICD-10-CM | POA: Insufficient documentation

## 2012-10-25 DIAGNOSIS — G4730 Sleep apnea, unspecified: Secondary | ICD-10-CM | POA: Insufficient documentation

## 2012-10-25 DIAGNOSIS — Z880 Allergy status to penicillin: Secondary | ICD-10-CM | POA: Insufficient documentation

## 2012-10-25 HISTORY — PX: TEE WITHOUT CARDIOVERSION: SHX5443

## 2012-10-25 SURGERY — ECHOCARDIOGRAM, TRANSESOPHAGEAL
Anesthesia: Moderate Sedation

## 2012-10-25 MED ORDER — MIDAZOLAM HCL 10 MG/2ML IJ SOLN
INTRAMUSCULAR | Status: DC | PRN
  Administered 2012-10-25 (×2): 2 mg via INTRAVENOUS

## 2012-10-25 MED ORDER — BUTAMBEN-TETRACAINE-BENZOCAINE 2-2-14 % EX AERO
INHALATION_SPRAY | CUTANEOUS | Status: DC | PRN
  Administered 2012-10-25: 2 via TOPICAL

## 2012-10-25 MED ORDER — FENTANYL CITRATE 0.05 MG/ML IJ SOLN
INTRAMUSCULAR | Status: AC
  Filled 2012-10-25: qty 4

## 2012-10-25 MED ORDER — MIDAZOLAM HCL 5 MG/ML IJ SOLN
INTRAMUSCULAR | Status: AC
  Filled 2012-10-25: qty 3

## 2012-10-25 MED ORDER — SODIUM CHLORIDE 0.9 % IV SOLN
INTRAVENOUS | Status: DC
  Administered 2012-10-25: 09:00:00 via INTRAVENOUS

## 2012-10-25 MED ORDER — FENTANYL CITRATE 0.05 MG/ML IJ SOLN
INTRAMUSCULAR | Status: DC | PRN
  Administered 2012-10-25 (×2): 25 ug via INTRAVENOUS

## 2012-10-25 NOTE — Interval H&P Note (Signed)
History and Physical Interval Note:  10/25/2012 10:18 AM  [name]  has presented today for surgery, with the diagnosis of A FIB  The various methods of treatment have been discussed with the patient and family. After consideration of risks, benefits and other options for treatment, the patient has consented to  Procedure(s): TRANSESOPHAGEAL ECHOCARDIOGRAM (TEE) (N/A) as a surgical intervention .  The patient's history has been reviewed, patient examined, no change in status, stable for surgery.  I have reviewed the patient's chart and labs.  Questions were answered to the patient's satisfaction.     Olga Millers

## 2012-10-25 NOTE — H&P (Signed)
[name]  10/08/2012 2:15 PM   Office Visit  MRN:  [id number]   Description: 57 year old male  Provider: Hillis Range, MD  Department: [address]        Referring Provider    Wanda Plump, MD      Diagnoses    Atrial fibrillation    -  Primary    427.31    Other primary cardiomyopathies        425.4    Chronic systolic dysfunction of left ventricle        429.9      Reason for Visit    Atrial Fibrillation       Reason For Visit History Recorded         Progress Notes    Marily Lente, RN at 10/08/2012  2:29 PM    Status: Signed                        PCP:  Willow Ora, MD   The patient presents today for routine electrophysiology followup.  Since last being seen in our clinic, the patient reports doing very well.  He feels like his rates in afib are relatively well controlled.  He has no exercise intolerance.  He reports fatigue and palpitations with his afib.  His EF has decreased.   Today, he denies symptoms of chest pain, shortness of breath, orthopnea, PND, lower extremity edema, dizziness, presyncope, syncope, or neurologic sequela.  The patient feels that he is tolerating medications without difficulties and is otherwise without complaint today.     Past Medical History   Diagnosis  Date   .  Persistent atrial fibrillation         s/p afib ablation 02/13/10   .  Atrial flutter         s/p CTI ablation 2006 by Ladona Ridgel   .  Sleep apnea     .  Non-ischemic cardiomyopathy         EF 40%   .  ALLERGIC RHINITIS         Past Surgical History   Procedure  Laterality  Date   .  Atrial ablation surgery    2006       CTI ablation   .  Joint replacement           toe joint   .  Tonsillectomy       .  Atrial fibrillation ablation    02/13/10         Current Outpatient Prescriptions   Medication  Sig  Dispense  Refill   .  carvedilol (COREG) 25 MG tablet  Take 1 tablet (25 mg total) by mouth 2 (two) times daily with a meal.   180 tablet    3   .  dabigatran (PRADAXA) 150 MG CAPS  Take 1 capsule (150 mg total) by mouth 2 (two) times daily.   180 capsule   3   .  ramipril (ALTACE) 5 MG capsule  Take 1 capsule (5 mg total) by mouth 2 (two) times daily.   180 capsule   3       No current facility-administered medications for this visit.         Allergies   Allergen  Reactions   .  Penicillins         REACTION: tolerates amoxicillin         History  Social History   .  Marital Status:  Married       Spouse Name:  N/A       Number of Children:  1   .  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  Smoking status:  Never Smoker    .  Smokeless tobacco:  Never Used   .  Alcohol Use:  No   .  Drug Use:  No   .  Sexually Active:  Not on file       Other Topics  Concern   .  Not on file       Social History Narrative     He is married and lives with his wife in [place].          Family History   Problem  Relation  Age of Onset   .  Lung cancer  Father         smoker   .  Lung cancer  Mother         smoker   .  Diabetes  Neg Hx     .  Prostate cancer  Neg Hx     .  Colon cancer  Neg Hx          ROS-  All systems are reviewed and are negative except as outlined in the HPI above   Physical Exam: Filed Vitals:     10/08/12 1418   BP:  112/82   Pulse:  112   Height:  5' 6.5" (1.689 m)   Weight:  198 lb (89.812 kg)        GEN- The patient is well appearing, alert and oriented x 3 today.    Head- normocephalic, atraumatic Eyes-  Sclera clear, conjunctiva pink Ears- hearing intact Oropharynx- clear Neck- supple, no JVP Lymph- no cervical lymphadenopathy Lungs- Clear to ausculation bilaterally, normal work of breathing Heart- irregular rate and rhythm, no murmurs, rubs or gallops, PMI not laterally displaced GI- soft, NT, ND, + BS Extremities- no clubbing, cyanosis, or edema MS- no significant deformity or  atrophy Skin- no rash or lesion Psych- euthymic mood, full affect Neuro- strength and sensation are intact   EKG- atrial fibrillation V rate 112 bpm, nonspecific ST/T changes   Echo 6/14 reveals EF 25%, LA 51mm, mild MR   Assessment and Plan:     1. Persistent afib The patient has symptomatic persistent afib.  He has previously failed medical therapy with amiodarone and tikosyn.  He underwent PVI by me 2012 and had a very good response initially.  Unfortunately, he has developed recurrent afib and with RVR, his EF has decreased. Therapeutic strategies for afib including medicine and ablation were discussed in detail with the patient today.  We discussed convergent procedure (which I have strongly recommended), MAZE, or endovascular PVI.  He is clear that he will not go to [facility] for convergent procedure and does not wish to have a MAZE.  He declines tikosyn or long term amiodarone.  He is willing to take amiodarone for a short period of time and would like to proceed with traditional afib ablation.   Risk, benefits, and alternatives to EP study and radiofrequency ablation for afib were also discussed in detail today. These risks include but are not limited to stroke, bleeding, vascular damage, tamponade, perforation, damage to the esophagus,  lungs, and other structures, pulmonary vein stenosis, worsening renal function, and death. The patient understands these risk and wishes to proceed.  We will therefore proceed with catheter ablation at the next available time. I will start amiodarone 200mg  daily at this time.   2. Nonischemic CM/ chronic systolic dysfunction NYHA Class II It is important that he maintain sinus rhythm long term Continue current medical therapy    For TEE prior to atrial fibrillation ablation; no changes. Olga Millers

## 2012-10-25 NOTE — Progress Notes (Signed)
*  PRELIMINARY RESULTS* Echocardiogram Echocardiogram Transesophageal has been performed.  [name] 10/25/2012, 10:35 AM

## 2012-10-25 NOTE — CV Procedure (Signed)
See full TEE report in [employer]; severe, global LV dysfunction; EF 25; mild to moderate LAE with spontaneous contrast; no LAA thrombus. Olga Millers

## 2012-10-26 ENCOUNTER — Other Ambulatory Visit: Payer: Managed Care, Other (non HMO)

## 2012-10-26 ENCOUNTER — Encounter (HOSPITAL_COMMUNITY): Payer: Self-pay | Admitting: Anesthesiology

## 2012-10-26 ENCOUNTER — Encounter (HOSPITAL_COMMUNITY): Payer: Self-pay | Admitting: Cardiology

## 2012-10-26 ENCOUNTER — Encounter (HOSPITAL_COMMUNITY)
Admission: RE | Disposition: A | Discharge status: Home / Self Care | Payer: Self-pay | Source: Ambulatory Visit | Attending: Internal Medicine

## 2012-10-26 ENCOUNTER — Ambulatory Visit (HOSPITAL_COMMUNITY)
Admission: RE | Admit: 2012-10-26 | Discharge: 2012-10-27 | Disposition: A | Discharge status: Home / Self Care | Payer: Managed Care, Other (non HMO) | Source: Ambulatory Visit | Attending: Internal Medicine | Admitting: Internal Medicine

## 2012-10-26 ENCOUNTER — Ambulatory Visit (HOSPITAL_COMMUNITY): Payer: Managed Care, Other (non HMO) | Admitting: Anesthesiology

## 2012-10-26 DIAGNOSIS — Z7902 Long term (current) use of antithrombotics/antiplatelets: Secondary | ICD-10-CM | POA: Insufficient documentation

## 2012-10-26 DIAGNOSIS — Z96698 Presence of other orthopedic joint implants: Secondary | ICD-10-CM | POA: Insufficient documentation

## 2012-10-26 DIAGNOSIS — I519 Heart disease, unspecified: Secondary | ICD-10-CM

## 2012-10-26 DIAGNOSIS — G4730 Sleep apnea, unspecified: Secondary | ICD-10-CM | POA: Insufficient documentation

## 2012-10-26 DIAGNOSIS — I428 Other cardiomyopathies: Secondary | ICD-10-CM | POA: Diagnosis present

## 2012-10-26 DIAGNOSIS — J309 Allergic rhinitis, unspecified: Secondary | ICD-10-CM | POA: Insufficient documentation

## 2012-10-26 DIAGNOSIS — Z79899 Other long term (current) drug therapy: Secondary | ICD-10-CM | POA: Insufficient documentation

## 2012-10-26 DIAGNOSIS — I4891 Unspecified atrial fibrillation: Secondary | ICD-10-CM | POA: Diagnosis present

## 2012-10-26 DIAGNOSIS — I4892 Unspecified atrial flutter: Secondary | ICD-10-CM | POA: Insufficient documentation

## 2012-10-26 HISTORY — PX: ATRIAL FIBRILLATION ABLATION: SHX5456

## 2012-10-26 LAB — POCT ACTIVATED CLOTTING TIME: Activated Clotting Time: 365 seconds

## 2012-10-26 LAB — MRSA PCR SCREENING: MRSA by PCR: NEGATIVE

## 2012-10-26 SURGERY — ATRIAL FIBRILLATION ABLATION
Anesthesia: General

## 2012-10-26 MED ORDER — OXYCODONE HCL 5 MG/5ML PO SOLN: 5.0000 mg | Freq: Once | ORAL | Status: AC | PRN

## 2012-10-26 MED ORDER — SODIUM CHLORIDE 0.9 % IJ SOLN
3.0000 mL | Freq: Two times a day (BID) | INTRAMUSCULAR | Status: DC
  Administered 2012-10-26: 3 mL via INTRAVENOUS

## 2012-10-26 MED ORDER — FENTANYL CITRATE 0.05 MG/ML IJ SOLN
INTRAMUSCULAR | Status: DC | PRN
  Administered 2012-10-26 (×2): 50 ug via INTRAVENOUS

## 2012-10-26 MED ORDER — HEPARIN SODIUM (PORCINE) 1000 UNIT/ML IJ SOLN
INTRAMUSCULAR | Status: AC
  Filled 2012-10-26: qty 1

## 2012-10-26 MED ORDER — DABIGATRAN ETEXILATE MESYLATE 150 MG PO CAPS
150.0000 mg | ORAL_CAPSULE | Freq: Two times a day (BID) | ORAL | Status: DC
  Administered 2012-10-26 – 2012-10-27 (×2): 150 mg via ORAL
  Filled 2012-10-26 (×3): qty 1

## 2012-10-26 MED ORDER — LIDOCAINE HCL (CARDIAC) 20 MG/ML IV SOLN
INTRAVENOUS | Status: DC | PRN
  Administered 2012-10-26: 10 mg via INTRAVENOUS

## 2012-10-26 MED ORDER — SODIUM CHLORIDE 0.9 % IV SOLN: 250.0000 mL | INTRAVENOUS | Status: DC | PRN

## 2012-10-26 MED ORDER — MIDAZOLAM HCL 5 MG/5ML IJ SOLN
INTRAMUSCULAR | Status: DC | PRN
  Administered 2012-10-26: 2 mg via INTRAVENOUS

## 2012-10-26 MED ORDER — DABIGATRAN ETEXILATE MESYLATE 150 MG PO CAPS
150.0000 mg | ORAL_CAPSULE | ORAL | Status: AC
  Administered 2012-10-26: 150 mg via ORAL
  Filled 2012-10-26: qty 1

## 2012-10-26 MED ORDER — HEPARIN SODIUM (PORCINE) 1000 UNIT/ML IJ SOLN
INTRAMUSCULAR | Status: DC | PRN
  Administered 2012-10-26: 12000 [IU] via INTRAVENOUS

## 2012-10-26 MED ORDER — PROPOFOL 10 MG/ML IV BOLUS
INTRAVENOUS | Status: DC | PRN
  Administered 2012-10-26: 200 mg via INTRAVENOUS

## 2012-10-26 MED ORDER — ONDANSETRON HCL 4 MG/2ML IJ SOLN: 4.0000 mg | Freq: Four times a day (QID) | INTRAMUSCULAR | Status: DC | PRN

## 2012-10-26 MED ORDER — PROMETHAZINE HCL 25 MG/ML IJ SOLN: 6.2500 mg | INTRAMUSCULAR | Status: DC | PRN

## 2012-10-26 MED ORDER — FENTANYL CITRATE 0.05 MG/ML IJ SOLN: 25.0000 ug | INTRAMUSCULAR | Status: DC | PRN

## 2012-10-26 MED ORDER — ACETAMINOPHEN 325 MG PO TABS: 650.0000 mg | ORAL_TABLET | ORAL | Status: DC | PRN

## 2012-10-26 MED ORDER — MEPERIDINE HCL 25 MG/ML IJ SOLN: 6.2500 mg | INTRAMUSCULAR | Status: DC | PRN

## 2012-10-26 MED ORDER — HYDROCODONE-ACETAMINOPHEN 5-325 MG PO TABS
1.0000 | ORAL_TABLET | ORAL | Status: DC | PRN
  Administered 2012-10-26: 2 via ORAL
  Administered 2012-10-26: 1 via ORAL
  Filled 2012-10-26: qty 2

## 2012-10-26 MED ORDER — LIDOCAINE-EPINEPHRINE 1 %-1:100000 IJ SOLN
3.0000 mL | Freq: Once | INTRAMUSCULAR | Status: DC
  Filled 2012-10-26: qty 5

## 2012-10-26 MED ORDER — MIDAZOLAM HCL 2 MG/2ML IJ SOLN: 0.5000 mg | Freq: Once | INTRAMUSCULAR | Status: AC | PRN

## 2012-10-26 MED ORDER — AMIODARONE HCL 200 MG PO TABS
200.0000 mg | ORAL_TABLET | Freq: Every day | ORAL | Status: DC
  Administered 2012-10-26 – 2012-10-27 (×2): 200 mg via ORAL
  Filled 2012-10-26 (×2): qty 1

## 2012-10-26 MED ORDER — SODIUM CHLORIDE 0.9 % IJ SOLN: 3.0000 mL | INTRAMUSCULAR | Status: DC | PRN

## 2012-10-26 MED ORDER — PROTAMINE SULFATE 10 MG/ML IV SOLN
INTRAVENOUS | Status: DC | PRN
  Administered 2012-10-26: 10 mg via INTRAVENOUS
  Administered 2012-10-26: 20 mg via INTRAVENOUS
  Administered 2012-10-26: 10 mg via INTRAVENOUS

## 2012-10-26 MED ORDER — ONDANSETRON HCL 4 MG/2ML IJ SOLN
INTRAMUSCULAR | Status: DC | PRN
  Administered 2012-10-26: 4 mg via INTRAVENOUS

## 2012-10-26 MED ORDER — SODIUM CHLORIDE 0.9 % IV SOLN
INTRAVENOUS | Status: DC | PRN
  Administered 2012-10-26 (×2): via INTRAVENOUS

## 2012-10-26 MED ORDER — OXYCODONE HCL 5 MG PO TABS
5.0000 mg | ORAL_TABLET | Freq: Once | ORAL | Status: AC | PRN
Start: 2012-10-26 — End: 2012-10-26

## 2012-10-26 NOTE — Preoperative (Signed)
Beta Blockers   Reason not to administer Beta Blockers:Not Applicable 

## 2012-10-26 NOTE — H&P (View-Only) (Signed)
  PCP:  Jose Paz, MD  The patient presents today for routine electrophysiology followup.  Since last being seen in our clinic, the patient reports doing very well.  He feels like his rates in afib are relatively well controlled.  He has no exercise intolerance.  He reports fatigue and palpitations with his afib.  His EF has decreased.  Today, he denies symptoms of chest pain, shortness of breath, orthopnea, PND, lower extremity edema, dizziness, presyncope, syncope, or neurologic sequela.  The patient feels that he is tolerating medications without difficulties and is otherwise without complaint today.   Past Medical History  Diagnosis Date  . Persistent atrial fibrillation     s/p afib ablation 02/13/10  . Atrial flutter     s/p CTI ablation 2006 by [facility]  . Sleep apnea   . Non-ischemic cardiomyopathy     EF 40%  . ALLERGIC RHINITIS    Past Surgical History  Procedure Laterality Date  . Atrial ablation surgery  2006    CTI ablation  . Joint replacement      toe joint  . Tonsillectomy    . Atrial fibrillation ablation  02/13/10    Current Outpatient Prescriptions  Medication Sig Dispense Refill  . carvedilol (COREG) 25 MG tablet Take 1 tablet (25 mg total) by mouth 2 (two) times daily with a meal.  180 tablet  3  . dabigatran (PRADAXA) 150 MG CAPS Take 1 capsule (150 mg total) by mouth 2 (two) times daily.  180 capsule  3  . ramipril (ALTACE) 5 MG capsule Take 1 capsule (5 mg total) by mouth 2 (two) times daily.  180 capsule  3   No current facility-administered medications for this visit.    Allergies  Allergen Reactions  . Penicillins     REACTION: tolerates amoxicillin    History   Social History  . Marital Status: Married    Spouse Name: N/A    Number of Children: 1  . 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 Smoking status: Never Smoker   . Smokeless tobacco: Never Used  . Alcohol Use: No  . Drug  Use: No  . Sexually Active: Not on file   Other Topics Concern  . Not on file   Social History Narrative   He is married and lives with his wife in .     Family History  Problem Relation Age of Onset  . Lung cancer Father     smoker  . Lung cancer Mother     smoker  . Diabetes Neg Hx   . Prostate cancer Neg Hx   . Colon cancer Neg Hx     ROS-  All systems are reviewed and are negative except as outlined in the HPI above  Physical Exam: Filed Vitals:   10/08/12 1418  BP: 112/82  Pulse: 112  Height: 5' 6.5" (1.689 m)  Weight: 198 lb (89.812 kg)    GEN- The patient is well appearing, alert and oriented x 3 today.   Head- normocephalic, atraumatic Eyes-  Sclera clear, conjunctiva pink Ears- hearing intact Oropharynx- clear Neck- supple, no JVP Lymph- no cervical lymphadenopathy Lungs- Clear to ausculation bilaterally, normal work of breathing Heart- irregular rate and rhythm, no murmurs, rubs or gallops, PMI not laterally displaced GI- soft, NT, ND, + BS Extremities- no clubbing, cyanosis, or edema MS- no significant deformity or atrophy Skin- no rash or lesion   Psych- euthymic mood, full affect Neuro- strength and sensation are intact  EKG- atrial fibrillation V rate 112 bpm, nonspecific ST/T changes  Echo 6/14 reveals EF 25%, LA 51mm, mild MR  Assessment and Plan:   1. Persistent afib The patient has symptomatic persistent afib.  He has previously failed medical therapy with amiodarone and tikosyn.  He underwent PVI by me 2012 and had a very good response initially.  Unfortunately, he has developed recurrent afib and with RVR, his EF has decreased. Therapeutic strategies for afib including medicine and ablation were discussed in detail with the patient today.  We discussed convergent procedure (which I have strongly recommended), MAZE, or endovascular PVI.  He is clear that he will not go to [facility] for convergent procedure and does not wish to have a MAZE.   He declines tikosyn or long term amiodarone.  He is willing to take amiodarone for a short period of time and would like to proceed with traditional afib ablation.   Risk, benefits, and alternatives to EP study and radiofrequency ablation for afib were also discussed in detail today. These risks include but are not limited to stroke, bleeding, vascular damage, tamponade, perforation, damage to the esophagus, lungs, and other structures, pulmonary vein stenosis, worsening renal function, and death. The patient understands these risk and wishes to proceed.  We will therefore proceed with catheter ablation at the next available time. I will start amiodarone 200mg daily at this time.  2. Nonischemic CM/ chronic systolic dysfunction NYHA Class II It is important that he maintain sinus rhythm long term Continue current medical therapy    

## 2012-10-26 NOTE — Op Note (Signed)
SURGEON:  Hillis Range, MD  PREPROCEDURE DIAGNOSES: 1. Persistent atrial fibrillation.  POSTPROCEDURE DIAGNOSES: 1. Persistent atrial fibrillation.  PROCEDURES: 1. Comprehensive electrophysiologic study. 2. Coronary sinus pacing and recording. 3. Three-dimensional mapping of atrial fibrillation (with additional mapping and ablation of a second discrete focus) 4. Ablation of atrial fibrillation (with additional mapping and ablation of a second discrete focus) 5. Intracardiac echocardiography. 6. Transseptal puncture of an intact septum. 7. Rotational Angiography with processing at an independent workstation 8. Arrhythmia induction with pacing  INTRODUCTION:  [name] is a 57 y.o. male with a history of persistent atrial fibrillation who now presents for EP study and radiofrequency ablation.  The patient reports initially being diagnosed with atrial fibrillation after presenting with symptomatic palpitations and fatgiue.  The patient has failed medical therapy with amiodarone.  He previously underwent atrial flutter ablation by Dr Ladona Ridgel in 2006.  He then underwent afib ablation by me 01/2010.  He did very well off of amiodarone with normalization of his EF.  Unfortunately, he has developed recurrent symptomatic persistent afib with decrease in EF.   The patient therefore presents today for catheter ablation of atrial fibrillation.  DESCRIPTION OF PROCEDURE:  Informed written consent was obtained, and the patient was brought to the electrophysiology lab in a fasting state.  The patient was adequately sedated with intravenous medications as outlined in the anesthesia report.  The patient's left and right groins were prepped and draped in the usual sterile fashion by the EP lab staff.  Using a percutaneous Seldinger technique, two 7-French and one 11-French hemostasis sheaths were placed into the right common femoral vein.    3 Dimensional Rotational Angiography: A 5 french pigtail catheter  was introduced through the right common femoral vein and advanced into the inferior venocava.  3 demential rotational angiography was then performed by power injection of 100cc of nonionic contrast.  Reprocessing at an independent work station was then performed.   This demonstrated a moderate sized left atrium with 4 separate pulmonary veins which were also moderate in size.  There were no anomalous veins or significant abnormalities.  A 3 dimensional rendering of the left atrium was then merged using [employer] onto the Well[employer] system and registered with intracardiac echo (see below).  The pigtail catheter was then removed.  Catheter Placement:  A 7-French Biosense Webster Decapolar coronary sinus catheter was introduced through the right common femoral vein and advanced into the coronary sinus for recording and pacing from this location.  A 6-French quadripolar catheter was introduced through the right common femoral vein and advanced into the right ventricle for recording and pacing.  This catheter was then pulled back to the His bundle location.    Initial Measurements: The patient presented to the electrophysiology lab in atrial fibrillation.  His QRS duration was 112 msec.    Intracardiac Echocardiography: A 10-French Biosense Webster AcuNav intracardiac echocardiography catheter was introduced through the left common femoral vein and advanced into the right atrium. Intracardiac echocardiography was performed of the left atrium, and a three-dimensional anatomical rendering of the left atrium was performed using CARTO sound technology.  The patient was noted to have a moderate sized left atrium.  The interatrial septum was prominent but not aneurysmal. All 4 pulmonary veins were visualized and noted to have separate ostia.  The pulmonary veins were moderate in size.  The left atrial appendage was visualized and did not reveal thrombus.   There was no evidence of pulmonary vein  stenosis.  Transseptal Puncture: The middle right common femoral vein sheath was exchanged for an 8.5 [place] SL2 transseptal sheath and transseptal access was achieved in a standard fashion using a Brockenbrough needle under biplane fluoroscopy with intracardiac echocardiography confirmation of the transseptal puncture.  Once transseptal access had been achieved, heparin was administered intravenously and intra- arterially in order to maintain an ACT of greater than 350 seconds throughout the procedure.   3D Mapping and Ablation: The His bundle catheter was removed and in its place a 3.5 mm [employer] Thermocool ablation catheter was advanced into the right atrium.  The transseptal sheath was pulled back into the IVC over a guidewire.  The ablation catheter was advanced across the transseptal hole using the wire as a guide.  The transseptal sheath was then re-advanced over the guidewire into the left atrium.  A duodecapolar Biosense Webster circular mapping catheter was introduced through the transseptal sheath and positioned over the mouth of all 4 pulmonary veins.  Three-dimensional electroanatomical mapping was performed using CARTO technology.  This demonstrated electrical activity within the left superior and right inferior pulmonary veins at baseline.  The left inferior and right superior pulmonary veins remained isolated from the prior procedure.  The patient underwent successful sequential electrical re-isolation and anatomical encircling of the left superior and right inferior pulmonary veins using radiofrequency current with a circular mapping catheter as a guide.  Complex fractionated electrograms (CFAEs) were then identified and ablated along the roof of the left atrium, along the ridge between the left superior pulmonary vein and the left atrial appendage,  along the lateral wall of the left atrium, and above the coronary sinus.  During ablation along the lateral wall of the  left atrium just below the left inferior pulmonary vein, the patient spontaneously converted to sinus rhythm.  He remained in sinus rhythm thereafter.  Measurements Following Ablation: In sinus rhythm the RR interval was , with PR 188 msec, QRS 100 msec, and Qtc 483 msec.  Following ablation the AH interval measured 68 msec with an HV interval of 59 msec. Ventricular pacing was performed, which revealed midline decremental VA conduction with a VA Wenckebach cycle length of  710 msec.  Rapid atrial pacing was performed, which revealed an AV Wenckebach cycle length of 350 msec.  Electroisolation was then again confirmed in all four pulmonary veins. No arrhythmias were induced with RAP down to a cycle length of 250 msec.  Differential atrial pacing from the low lateral right atrium was performed which confirmed bidirectional isthmus block from the prior ablation procedure. The procedure was therefore considered completed.  All catheters were removed, and the sheaths were aspirated and flushed.  The patient was transferred to the recovery area for sheath removal per protocol.  A limited bedside transthoracic echocardiogram revealed no pericardial effusion.  There were no early apparent complications.  CONCLUSIONS: 1. Atrial fibrillation upon presentation.   2. Rotational Angiography reveals a moderate sized left atrium with four separate pulmonary veins without evidence of pulmonary vein stenosis. 3. There was return of electrical activity within the left superior and right inferior pulmonary veins.  The left inferior and right superior pulmonary veins remained isolated from the prior procedure.  The patient underwent successful sequential electrical re-isolation and anatomical encircling of the left superior and right inferior pulmonary veins using radiofrequency current with a circular mapping catheter as a guide.   4. Complex fractionated electrograms (CFAEs) were then identified and ablated along  the roof of the left atrium,  along the ridge between the left superior pulmonary vein and the left atrial appendage,  along the lateral wall of the left atrium, and above the coronary sinus.  During ablation along the lateral wall of the left atrium just below the left inferior pulmonary vein, the patient spontaneously converted to sinus rhythm.  He remained in sinus rhythm thereafter. 5. No early apparent complications.   Fayrene Fearing Kevin Mario,MD 10:49 AM 10/26/2012

## 2012-10-26 NOTE — Anesthesia Procedure Notes (Signed)
Procedure Name: LMA Insertion Date/Time: 10/26/2012 8:13 AM Performed by: Romie Minus K Pre-anesthesia Checklist: Patient identified, Emergency Drugs available, Suction available, Patient being monitored and Timeout performed Patient Re-evaluated:Patient Re-evaluated prior to inductionOxygen Delivery Method: Circle system utilized Preoxygenation: Pre-oxygenation with 100% oxygen Intubation Type: IV induction Ventilation: Two handed mask ventilation required LMA: LMA with gastric port inserted LMA Size: 5.0 Number of attempts: 1 Placement Confirmation: positive ETCO2,  CO2 detector and breath sounds checked- equal and bilateral Tube secured with: Tape Dental Injury: Teeth and Oropharynx as per pre-operative assessment

## 2012-10-26 NOTE — Transfer of Care (Signed)
Immediate Anesthesia Transfer of Care Note  Patient: [name]  Procedure(s) Performed: Procedure(s): ATRIAL FIBRILLATION ABLATION (N/A)  Patient Location: Cath Lab  Anesthesia Type:General  Level of Consciousness: awake, alert , oriented and patient cooperative  Airway & Oxygen Therapy: Patient Spontanous Breathing and Patient connected to nasal cannula oxygen  Post-op Assessment: Report given to PACU RN and Post -op Vital signs reviewed and stable  Post vital signs: Reviewed and stable  Complications: No apparent anesthesia complications

## 2012-10-26 NOTE — Interval H&P Note (Signed)
History and Physical Interval Note:  10/26/2012 7:21 AM  [name]  has presented today for surgery, with the diagnosis of afib  The various methods of treatment have been discussed with the patient and family. After consideration of risks, benefits and other options for treatment, the patient has consented to  Procedure(s): ATRIAL FIBRILLATION ABLATION (N/A) as a surgical intervention .  The patient's history has been reviewed, patient examined, no change in status, stable for surgery.  I have reviewed the patient's chart and labs.  Questions were answered to the patient's satisfaction.     Hillis Range

## 2012-10-26 NOTE — Progress Notes (Signed)
Doing well s/p afib ablation.  He did have some groin oozing post procedure with pradaxa. Anticipate discharge to home tomorrow am.  Would decrease amiodarone to 200mg  daily Continue pradaxa 150mg  BID uninterrupted May have to reduce coreg if heart rates remain slow in sinus Add protonix 40mg  daily at discharge for 6 weeks DC to home Saturday am Follow-up with me in the office in 12 weeks

## 2012-10-26 NOTE — Anesthesia Preprocedure Evaluation (Addendum)
Anesthesia Evaluation  Patient identified by MRN, date of birth, ID band Patient awake    Reviewed: Allergy & Precautions, H&P , NPO status , Patient's Chart, lab work & pertinent test results, reviewed documented beta blocker date and time   History of Anesthesia Complications Negative for: history of anesthetic complications  Airway Mallampati: III TM Distance: >3 FB Neck ROM: Full    Dental  (+) Edentulous Upper, Poor Dentition, Partial Lower and Dental Advisory Given   Pulmonary sleep apnea (recently diagnosed, no CPAP) ,  breath sounds clear to auscultation  Pulmonary exam normal       Cardiovascular + Peripheral Vascular Disease and +CHF + dysrhythmias (pradaxa) Atrial Fibrillation Rhythm:Irregular Rate:Tachycardia  Left ventricle: Systolic function was severely reduced.   The estimated ejection fraction was in the range of 25% to   30%. Diffuse hypokinesis. - Aortic valve: No evidence of vegetation. - Mitral valve: No evidence of vegetation. Mild   regurgitation. - Left atrium: The atrium was mildly to moderately dilated.   No evidence of thrombus in the atrial cavity or appendage.   There was mild spontaneous echo contrast ("smoke") in the   cavity and the appendage. - Atrial septum: No defect or patent foramen ovale was   identified. - Tricuspid valve: No evidence of vegetation. - Pulmonic valve: No evidence of vegetation.    Neuro/Psych negative neurological ROS     GI/Hepatic negative GI ROS, Neg liver ROS,   Endo/Other  negative endocrine ROS  Renal/GU negative Renal ROS     Musculoskeletal   Abdominal (+) + obese,   Peds  Hematology   Anesthesia Other Findings   Reproductive/Obstetrics negative OB ROS                         Anesthesia Physical Anesthesia Plan  ASA: III  Anesthesia Plan: General   Post-op Pain Management:    Induction: Intravenous  Airway Management  Planned: LMA  Additional Equipment:   Intra-op Plan:   Post-operative Plan:   Informed Consent: I have reviewed the patients History and Physical, chart, labs and discussed the procedure including the risks, benefits and alternatives for the proposed anesthesia with the patient or authorized representative who has indicated his/her understanding and acceptance.   Dental advisory given  Plan Discussed with: CRNA and Surgeon  Anesthesia Plan Comments: (Plan routine monitors, GA- LMA OK)        Anesthesia Quick Evaluation

## 2012-10-27 ENCOUNTER — Encounter (HOSPITAL_COMMUNITY): Payer: Self-pay | Admitting: Physician Assistant

## 2012-10-27 DIAGNOSIS — I519 Heart disease, unspecified: Secondary | ICD-10-CM

## 2012-10-27 DIAGNOSIS — I4891 Unspecified atrial fibrillation: Secondary | ICD-10-CM

## 2012-10-27 LAB — BASIC METABOLIC PANEL
BUN: 13 mg/dL (ref 6–23)
Creatinine, Ser: 0.99 mg/dL (ref 0.50–1.35)
GFR calc non Af Amer: 90 mL/min — ABNORMAL LOW (ref >90)
Glucose, Bld: 118 mg/dL — ABNORMAL HIGH (ref 70–99)
Potassium: 3.3 mEq/L — ABNORMAL LOW (ref 3.5–5.1)

## 2012-10-27 MED ORDER — CARVEDILOL 25 MG PO TABS: 12.5000 mg | ORAL_TABLET | Freq: Two times a day (BID) | ORAL | Status: DC

## 2012-10-27 MED ORDER — AMIODARONE HCL 200 MG PO TABS: 200.0000 mg | ORAL_TABLET | Freq: Every day | ORAL | Status: DC

## 2012-10-27 MED ORDER — ALUM & MAG HYDROXIDE-SIMETH 200-200-20 MG/5ML PO SUSP
15.0000 mL | ORAL | Status: DC | PRN
  Administered 2012-10-27: 30 mL via ORAL
  Filled 2012-10-27: qty 30

## 2012-10-27 MED ORDER — PANTOPRAZOLE SODIUM 40 MG PO TBEC: 40.0000 mg | DELAYED_RELEASE_TABLET | Freq: Every day | ORAL | Status: DC

## 2012-10-27 MED ORDER — RAMIPRIL 5 MG PO CAPS: 5.0000 mg | ORAL_CAPSULE | Freq: Every day | ORAL | Status: DC

## 2012-10-27 MED ORDER — POTASSIUM CHLORIDE CRYS ER 20 MEQ PO TBCR
40.0000 meq | EXTENDED_RELEASE_TABLET | Freq: Once | ORAL | Status: AC
  Administered 2012-10-27: 40 meq via ORAL
  Filled 2012-10-27: qty 2

## 2012-10-27 NOTE — Discharge Summary (Signed)
Discharge Summary   Patient ID: [name] MRN: [id number], DOB/AGE: 56-17-1957 56 y.o. Admit date: 10/26/2012 D/C date:     10/27/2012  Primary Cardiologist: Allred  Primary Discharge Diagnoses:  1. Paroxysmal atrial fibrillation - s/p TEE/ablation this admission - prior history of afib ablation 01/2010 2. Dilated cardiomyopathy - most recent EF 25%  Secondary Discharge Diagnoses:  1. Atrial flutter s/p CTI ablation 2006 by Ladona Ridgel  2. Sleep apnea  3. ALLERGIC RHINITIS   Hospital Course:[name] is a 57 y/o M with history of persistent afib s/p ablation 01/2010, atrial flutter s/p CTI ablation 2006 by Ladona Ridgel, NICM who presented to the office recently with complaints of recurrent atrial fibrillation. His rates were controlled but he reported fatigue and palpitations with this. HR was 112 in the office. He has previously failed medical therapy with amiodarone and tikosyn. He underwent PVI by Dr. Johney Frame several years ago and had a good result at first, but unfortunately he has developed recurrent afib and with RVR and his EF has decreased. Recent echo 07/2012 showed an EF 20-25% with diffuse HK. Dr. Johney Frame discussed the different options with the patient. Per his office note, "We discussed convergent procedure (which I have strongly recommended), MAZE, or endovascular PVI. He is clear that he will not go to [facility] for convergent procedure and does not wish to have a MAZE. He declines tikosyn or long term amiodarone. He is willing to take amiodarone for a short period of time and would like to proceed with traditional afib ablation." Dr. Johney Frame started amiodarone 200mg  BID. The patient was brought into the hospital 10/25/12 and underwent TEE showing severe, global LV dysfunction; EF 25; mild to moderate LAE with spontaneous contrast; no LAA thrombus. He subsequently underwent EPS and ablation. He did have some groin oozing post procedure with Pradaxa thus was observed overnight. He did well this  morning. Dr. Shirlee Latch has seen and examined the patient today and feels he is stable for discharge. Per MD notes, the patient is to continue Pradaxa 150mg  BID, amiodarone 200mg  reduced to once daily (further monitoring of organ systems at discretion of primary cardiologist), Coreg 12.5mg  BID (this is the dose in case HR runs slower in NSR, can always increase to 25mg  BID if HR ok), ramipril 5mg  daily, and Protonix for 6 weeks. Dr. Johney Frame recommended f/u in 12 weeks. I have left a message on our office's scheduling voicemail requesting a follow-up appointment, and our office will call the patient with this appointment.   Discharge Vitals: Blood pressure 128/78, pulse 91, temperature 98.5 F (36.9 C), temperature source Oral, resp. rate 20, height 5\' 7"  (1.702 m), weight 200 lb 13.4 oz (91.1 kg), SpO2 96.00%.  Labs: Lab Results  Component Value Date   WBC 6.4 10/19/2012   HGB 15.6 10/19/2012   HCT 45.8 10/19/2012   MCV 91.0 10/19/2012   PLT 149.0* 10/19/2012     Recent Labs Lab 10/27/12 0605  NA 138  K 3.3*  CL 104  CO2 24  BUN 13  CREATININE 0.99  CALCIUM 8.2*  GLUCOSE 118*   K repleted prior to dc Diagnostic Studies/Procedures   See ablation/TEE report  Discharge Medications     Medication List         amiodarone 200 MG tablet  Commonly known as:  PACERONE  Take 1 tablet (200 mg total) by mouth daily.     carvedilol 25 MG tablet  Commonly known as:  COREG  Take 0.5 tablets (12.5 mg  total) by mouth 2 (two) times daily with a meal.     dabigatran 150 MG Caps capsule  Commonly known as:  PRADAXA  Take 1 capsule (150 mg total) by mouth 2 (two) times daily.     pantoprazole 40 MG tablet  Commonly known as:  PROTONIX  Take 1 tablet (40 mg total) by mouth daily. For 6 weeks.     ramipril 5 MG capsule  Commonly known as:  ALTACE  Take 1 capsule (5 mg total) by mouth daily.        Disposition   The patient will be discharged in stable condition to home. Discharge  Orders   Future Orders Complete By Expires   Diet - low sodium heart healthy  As directed    Discharge instructions  As directed    Comments:     Patients on amiodarone may need intermittent check-ups of other organ systems, such as lungs, thyroid, eyes, and liver function. Please talk to your doctor at your follow-up appointments about what monitoring may be needed for you.  One of your heart tests showed weakness of the heart muscle this admission. This may make you more susceptible to weight gain from fluid retention, which can lead to symptoms that we call heart failure. For patients with this condition, we give them these special instructions:  1. Follow a low-salt diet and watch your fluid intake. In general, you should not be taking in more than 2 liters of fluid per day (no more than 8 glasses per day). Some patients are restricted to less than 1.5 liters of fluid per day (no more than 6 glasses per day). This includes sources of water in foods like soup, coffee, tea, milk, etc. 2. Weigh yourself on the same scale at same time of day and keep a log. 3. Call your doctor: (Anytime you feel any of the following symptoms)  - 3-4 pound weight gain in 1-2 days or 2 pounds overnight  - Shortness of breath, with or without a dry hacking cough  - Swelling in the hands, feet or stomach  - If you have to sleep on extra pillows at night in order to breathe  IT IS IMPORTANT TO LET YOUR DOCTOR KNOW EARLY ON IF YOU ARE HAVING SYMPTOMS SO WE CAN HELP YOU!   Increase activity slowly  As directed    Comments:     No driving for 2 days. No lifting over 5 lbs for 1 week. No sexual activity for 1 week. You may return to work on Tuesday 10/30/12. Keep procedure site clean & dry. If you notice increased pain, swelling, bleeding or pus, call/return!  You may shower, but no soaking baths/hot tubs/pools for 1 week.     Follow-up Information   Follow up with Hillis Range, MD. (Our office will call you for a  follow-up appointment. Please call the office if you have not heard from [place] within 3 days.)    Specialty:  Cardiology   Contact information:   [address] [place] [address] [phone]         Duration of Discharge Encounter: Greater than 30 minutes including physician and PA time.  Signed, Dayna Dunn PA-C 10/27/2012, 9:10 AM

## 2012-10-27 NOTE — Progress Notes (Signed)
Patient ID: [name], male   DOB: [birth year], 56 y.o.   MRN: [id number]    SUBJECTIVE:  No complaints this morning.  Remains in NSR.    Marland Kitchen amiodarone  200 mg Oral Daily  . dabigatran  150 mg Oral BID  . lidocaine-EPINEPHrine  3-5 mL Intradermal Once  . sodium chloride  3 mL Intravenous Q12H      Filed Vitals:   10/26/12 2035 10/27/12 0105 10/27/12 0510 10/27/12 0819  BP: 115/73 107/64 131/73 128/78  Pulse:    91  Temp:   99 F (37.2 C) 98.5 F (36.9 C)  TempSrc:   Oral Oral  Resp:    20  Height:      Weight:      SpO2:   98% 96%    Intake/Output Summary (Last 24 hours) at 10/27/12 0836 Last data filed at 10/27/12 0500  Gross per 24 hour  Intake   1340 ml  Output    475 ml  Net    865 ml    LABS: Basic Metabolic Panel: No results found for this basename: NA, K, CL, CO2, GLUCOSE, BUN, CREATININE, CALCIUM, MG, PHOS,  in the last 72 hours Liver Function Tests: No results found for this basename: AST, ALT, ALKPHOS, BILITOT, PROT, ALBUMIN,  in the last 72 hours No results found for this basename: LIPASE, AMYLASE,  in the last 72 hours CBC: No results found for this basename: WBC, NEUTROABS, HGB, HCT, MCV, PLT,  in the last 72 hours Cardiac Enzymes: No results found for this basename: CKTOTAL, CKMB, CKMBINDEX, TROPONINI,  in the last 72 hours BNP: No components found with this basename: POCBNP,  D-Dimer: No results found for this basename: DDIMER,  in the last 72 hours Hemoglobin A1C: No results found for this basename: HGBA1C,  in the last 72 hours Fasting Lipid Panel: No results found for this basename: CHOL, HDL, LDLCALC, TRIG, CHOLHDL, LDLDIRECT,  in the last 72 hours Thyroid Function Tests: No results found for this basename: TSH, T4TOTAL, FREET3, T3FREE, THYROIDAB,  in the last 72 hours Anemia Panel: No results found for this basename: VITAMINB12, FOLATE, FERRITIN, TIBC, IRON, RETICCTPCT,  in the last 72 hours  RADIOLOGY: No results found.  PHYSICAL  EXAM General: NAD Neck: No JVD, no thyromegaly or thyroid nodule.  Lungs: Clear to auscultation bilaterally with normal respiratory effort. CV: Nondisplaced PMI.  Heart regular S1/S2, no S3/S4, no murmur.  No peripheral edema.  No carotid bruit.  Normal pedal pulses.  Abdomen: Soft, nontender, no hepatosplenomegaly, no distention.  Neurologic: Alert and oriented x 3.  Psych: Normal affect. Extremities: No clubbing or cyanosis, right groin cath site benign.  TELEMETRY: Reviewed telemetry pt in NSR  ASSESSMENT AND PLAN: 57 yo with history of paroxysmal atrial fibrillation and dilated cardiomyopathy is now s/p atrial fibrillation ablation.  1. Atrial fibrillation: Stable this morning, in NSR.  Right groin site benign.  OK for discharge.  Needs to continue amiodarone 200 mg daily and Pradaxa 150 bid.  2. Dilated cardiomyopathy: Volume status stable. Continue ramipril.  Will send home on Coreg 12.5 mg bid in case HR runs a bit low in NSR.  Can increase back to 25 mg bid as outpatient if HR ok.  3. Disposition: Home today.  Awaiting BMET however.  Will send home on amiodarone 200 daily, Pradaxa 150 bid, Coreg 12.5 mg bid, ramipril 5 mg daily.  Needs followup with Dr. Johney Frame within 2 wks.   [name] 10/27/2012 8:40 AM

## 2012-10-30 MED FILL — Lidocaine Inj 1% w/ Epinephrine-1:100000: INTRAMUSCULAR | Qty: 20 | Status: AC

## 2012-10-30 MED FILL — Hydrocodone-Acetaminophen Tab 5-325 MG: ORAL | Qty: 1 | Status: AC

## 2012-11-01 ENCOUNTER — Telehealth: Payer: Self-pay | Admitting: Internal Medicine

## 2012-11-01 NOTE — Telephone Encounter (Addendum)
Spoke with patient and he has a knot in the area where Dr went in for procedure I let him know this is normal.  I called patient back today and the area is still the same.  He will call me back if the area worsens

## 2012-11-01 NOTE — Telephone Encounter (Signed)
New Problem  Pt states he needs the nurse to call has a few questions would not like to disclose

## 2012-11-12 NOTE — Anesthesia Postprocedure Evaluation (Signed)
  Anesthesia Post-op Note  Patient: [name]  Procedure(s) Performed: Procedure(s): ATRIAL FIBRILLATION ABLATION (N/A)  Patient discharged with no apparent anesthetic complications

## 2013-02-04 ENCOUNTER — Ambulatory Visit (INDEPENDENT_AMBULATORY_CARE_PROVIDER_SITE_OTHER): Payer: Managed Care, Other (non HMO) | Admitting: Internal Medicine

## 2013-02-04 ENCOUNTER — Encounter (INDEPENDENT_AMBULATORY_CARE_PROVIDER_SITE_OTHER): Payer: Self-pay

## 2013-02-04 ENCOUNTER — Encounter: Payer: Self-pay | Admitting: Internal Medicine

## 2013-02-04 VITALS — BP 110/72 | HR 34 | Ht 67.5 in | Wt 204.1 lb

## 2013-02-04 DIAGNOSIS — I428 Other cardiomyopathies: Secondary | ICD-10-CM

## 2013-02-04 DIAGNOSIS — R001 Bradycardia, unspecified: Secondary | ICD-10-CM | POA: Insufficient documentation

## 2013-02-04 DIAGNOSIS — I519 Heart disease, unspecified: Secondary | ICD-10-CM

## 2013-02-04 DIAGNOSIS — I4891 Unspecified atrial fibrillation: Secondary | ICD-10-CM

## 2013-02-04 DIAGNOSIS — I498 Other specified cardiac arrhythmias: Secondary | ICD-10-CM

## 2013-02-04 MED ORDER — CARVEDILOL 6.25 MG PO TABS: 6.2500 mg | ORAL_TABLET | Freq: Two times a day (BID) | ORAL | Status: DC

## 2013-02-04 NOTE — Progress Notes (Signed)
PCP:  Willow Ora, MD  The patient presents today for routine electrophysiology followup.  Since PVI 09/2012, the patient reports doing very well.  He reports that he had some atrial fibrillation for about a month after his procedure but none since.  He is bradycardic today, but has no functional limitations.    Today, he denies symptoms of palpitations, chest pain, shortness of breath, orthopnea, PND, lower extremity edema, dizziness, presyncope, syncope, or neurologic sequela.  The patient feels that he is tolerating medications without difficulties and is otherwise without complaint today.   Past Medical History  Diagnosis Date  . Persistent atrial fibrillation     a. s/p afib ablation 02/13/10. b. s/p TEE/ablation 09/2012.  Marland Kitchen Atrial flutter     s/p CTI ablation 2006 by Ladona Ridgel  . Sleep apnea   . Non-ischemic cardiomyopathy     Most recent EF 25% in 2014  . ALLERGIC RHINITIS    Past Surgical History  Procedure Laterality Date  . Atrial ablation surgery  2006    CTI ablation  . Joint replacement      toe joint  . Tonsillectomy    . Atrial fibrillation ablation  02/13/10  . Tee without cardioversion N/A 10/25/2012    Procedure: TRANSESOPHAGEAL ECHOCARDIOGRAM (TEE);  Surgeon: Lewayne Bunting, MD;  Location: [facility] ENDOSCOPY;  Service: Cardiovascular;  Laterality: N/A;    Current Outpatient Prescriptions  Medication Sig Dispense Refill  . amiodarone (PACERONE) 200 MG tablet Take 1 tablet (200 mg total) by mouth daily.      . carvedilol (COREG) 25 MG tablet Take 0.5 tablets (12.5 mg total) by mouth 2 (two) times daily with a meal.      . dabigatran (PRADAXA) 150 MG CAPS Take 1 capsule (150 mg total) by mouth 2 (two) times daily.  180 capsule  3  . ramipril (ALTACE) 5 MG capsule Take 1 capsule (5 mg total) by mouth daily.       No current facility-administered medications for this visit.    Allergies  Allergen Reactions  . Penicillins     REACTION: tolerates amoxicillin    History    Social History  . Marital Status: Married    Spouse Name: N/A    Number of Children: 1  . 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 Smoking status: Never Smoker   . Smokeless tobacco: Never Used  . Alcohol Use: No  . Drug Use: No  . Sexual Activity: Not on file   Other Topics Concern  . Not on file   Social History Narrative   He is married and lives with his wife in [place].     Family History  Problem Relation Age of Onset  . Lung cancer Father     smoker  . Lung cancer Mother     smoker  . Diabetes Neg Hx   . Prostate cancer Neg Hx   . Colon cancer Neg Hx     ROS-  All systems are reviewed and are negative except as outlined in the HPI above  Physical Exam: Filed Vitals:   02/04/13 1545  BP: 110/72  Pulse: 34  Height: 5' 7.5" (1.715 m)  Weight: 204 lb 1.9 oz (92.588 kg)    GEN- The patient is well appearing, alert and oriented x 3 today.   Head- normocephalic, atraumatic Eyes-  Sclera clear, conjunctiva pink Ears- hearing intact Oropharynx- clear Neck- supple, no  JVP Lymph- no cervical lymphadenopathy Lungs- Clear to ausculation bilaterally, normal work of breathing Heart- Regular rate and rhythm, no murmurs, rubs or gallops, PMI not laterally displaced GI- soft, NT, ND, + BS Extremities- no clubbing, cyanosis, or edema MS- no significant deformity or atrophy Skin- no rash or lesion Psych- euthymic mood, full affect Neuro- strength and sensation are intact  EKG today reveals sinus bradycardia 34 bm, nonspecific ST/T changes  Assessment and Plan: 1.  Atrial fibrillation Maintaining sinus rhythm post ablation Stop amiodarone Continue pradaxa  2. Asymptomatic bradycardia Stop amiodarone Decrease coreg to 6.25mg  BID  3. Nonischemic CM Repeat echo now that he has been in sinus rhythm post ablation If EF has recovered I may be inclined to stop coreg given bradycardia  Return to see me in  3 months

## 2013-02-04 NOTE — Patient Instructions (Addendum)
Your physician wants you to follow-up in: 3 months with Dr Johney Frame Bonita Quin will receive a reminder letter in the mail two months in advance. If you don't receive a letter, please call our office to schedule the follow-up appointment.  Your physician has recommended you make the following change in your medication:  1) Stop Amiodarone 2) Decrease Carvedilol to 6.25mg  twice   Your physician has requested that you have an echocardiogram. Echocardiography is a painless test that uses sound waves to create images of your heart. It provides your doctor with information about the size and shape of your heart and how well your heart's chambers and valves are working. This procedure takes approximately one hour. There are no restrictions for this procedure.

## 2013-02-06 ENCOUNTER — Ambulatory Visit: Payer: Managed Care, Other (non HMO) | Admitting: Internal Medicine

## 2013-02-20 ENCOUNTER — Ambulatory Visit (HOSPITAL_COMMUNITY): Payer: Managed Care, Other (non HMO) | Attending: Internal Medicine | Admitting: Radiology

## 2013-02-20 ENCOUNTER — Other Ambulatory Visit (HOSPITAL_COMMUNITY): Payer: Self-pay | Admitting: Internal Medicine

## 2013-02-20 ENCOUNTER — Encounter: Payer: Self-pay | Admitting: Cardiology

## 2013-02-20 DIAGNOSIS — G4730 Sleep apnea, unspecified: Secondary | ICD-10-CM | POA: Insufficient documentation

## 2013-02-20 DIAGNOSIS — E669 Obesity, unspecified: Secondary | ICD-10-CM | POA: Insufficient documentation

## 2013-02-20 DIAGNOSIS — I428 Other cardiomyopathies: Secondary | ICD-10-CM | POA: Insufficient documentation

## 2013-02-20 DIAGNOSIS — I4891 Unspecified atrial fibrillation: Secondary | ICD-10-CM | POA: Insufficient documentation

## 2013-02-20 NOTE — Progress Notes (Signed)
Echocardiogram performed.  

## 2013-03-11 ENCOUNTER — Telehealth: Payer: Self-pay | Admitting: *Deleted

## 2013-03-11 NOTE — Telephone Encounter (Addendum)
Because of ech results patient is able to stop Carvedilol  Spoke with patient and he is aware  He will take 6.25mg  daily stop after Friday

## 2013-05-09 ENCOUNTER — Encounter: Payer: Self-pay | Admitting: Internal Medicine

## 2013-05-09 ENCOUNTER — Ambulatory Visit (INDEPENDENT_AMBULATORY_CARE_PROVIDER_SITE_OTHER): Payer: Managed Care, Other (non HMO) | Admitting: Internal Medicine

## 2013-05-09 VITALS — BP 149/79 | HR 42 | Ht 67.5 in | Wt 211.0 lb

## 2013-05-09 DIAGNOSIS — I498 Other specified cardiac arrhythmias: Secondary | ICD-10-CM

## 2013-05-09 DIAGNOSIS — R001 Bradycardia, unspecified: Secondary | ICD-10-CM

## 2013-05-09 DIAGNOSIS — I428 Other cardiomyopathies: Secondary | ICD-10-CM

## 2013-05-09 DIAGNOSIS — I10 Essential (primary) hypertension: Secondary | ICD-10-CM | POA: Insufficient documentation

## 2013-05-09 DIAGNOSIS — I519 Heart disease, unspecified: Secondary | ICD-10-CM

## 2013-05-09 DIAGNOSIS — I4891 Unspecified atrial fibrillation: Secondary | ICD-10-CM

## 2013-05-09 MED ORDER — RAMIPRIL 10 MG PO CAPS: 10.0000 mg | ORAL_CAPSULE | Freq: Every day | ORAL | Status: DC

## 2013-05-09 NOTE — Patient Instructions (Signed)
Your physician wants you to follow-up in: 6 months with Dr Jacquiline Doe will receive a reminder letter in the mail two months in advance. If you don't receive a letter, please call our office to schedule the follow-up appointment.  Your physician has recommended you make the following change in your medication:  1) Increase Ramipril to 10mg  daily   Your physician recommends that you return for lab work fasting : BMP/Fasting lipids/liver

## 2013-05-09 NOTE — Progress Notes (Signed)
PCP:  Willow OraJose Paz, MD  The patient presents today for routine electrophysiology followup.  Since last being seen in our clinic, the patient reports doing very well.  He has persistent bradycardia but has no functional limitations.  He denies recurrent atrial fibrillation.   Today, he denies symptoms of palpitations, chest pain, shortness of breath, orthopnea, PND, lower extremity edema, dizziness, presyncope, syncope, or neurologic sequela.  The patient feels that he is tolerating medications without difficulties and is otherwise without complaint today.   Past Medical History  Diagnosis Date  . Persistent atrial fibrillation     a. s/p afib ablation 02/13/10. b. s/p TEE/ablation 09/2012.  Marland Kitchen. Atrial flutter     s/p CTI ablation 2006 by Ladona Ridgelaylor  . Sleep apnea   . Non-ischemic cardiomyopathy     Most recent EF 25% in 2014  . ALLERGIC RHINITIS    Past Surgical History  Procedure Laterality Date  . Atrial ablation surgery  2006    CTI ablation  . Joint replacement      toe joint  . Tonsillectomy    . Atrial fibrillation ablation  02/13/10  . Tee without cardioversion N/A 10/25/2012    Procedure: TRANSESOPHAGEAL ECHOCARDIOGRAM (TEE);  Surgeon: Lewayne BuntingBrian S Crenshaw, MD;  Location[facility] ENDOSCOPY;  Service: Cardiovascular;  Laterality: N/A;    Current Outpatient Prescriptions  Medication Sig Dispense Refill  . dabigatran (PRADAXA) 150 MG CAPS Take 1 capsule (150 mg total) by mouth 2 (two) times daily.  180 capsule  3  . ramipril (ALTACE) 5 MG capsule Take 1 capsule (5 mg total) by mouth daily.       No current facility-administered medications for this visit.    Allergies  Allergen Reactions  . Penicillins     REACTION: tolerates amoxicillin    History   Social History  . Marital Status: Married    Spouse Name: N/A    Number of Children: 1  . 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 Smoking status: Never Smoker   . Smokeless  tobacco: Never Used  . Alcohol Use: No  . Drug Use: No  . Sexual Activity: Not on file   Other Topics Concern  . Not on file   Social History Narrative   He is married and lives [place].     Family History  Problem Relation Age of Onset  . Lung cancer Father     smoker  . Lung cancer Mother     smoker  . Diabetes Neg Hx   . Prostate cancer Neg Hx   . Colon cancer Neg Hx     ROS-  All systems are reviewed and are negative except as outlined in the HPI above  Physical Exam: Filed Vitals:   05/09/13 1511  BP: 149/79  Pulse: 42  Height: 5' 7.5" (1.715 m)  Weight: 211 lb (95.709 kg)    GEN- The patient is well appearing, alert and oriented x 3 today.   Head- normocephalic, atraumatic Eyes-  Sclera clear, conjunctiva pink Ears- hearing intact Oropharynx- clear Neck- supple, no JVP Lymph- no cervical lymphadenopathy Lungs- Clear to ausculation bilaterally, normal work of breathing Heart- Regular rate and rhythm, no murmurs, rubs or gallops, PMI not laterally displaced GI- soft, NT, ND, + BS Extremities- no clubbing, cyanosis, or edema Neuro- strength and sensation are intact  EKG today reveals sinus bradycardia, diffuse twi Echo is reviewed  Assessment and Plan:  1. Persistent afib Maintaining sinus despite severe LA enlargement post ablation off of AAD Continue pradaxa  2. htn  Above goal Increase ramipril to 10mg  daily bmet  3. 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

## 2013-05-17 ENCOUNTER — Ambulatory Visit (INDEPENDENT_AMBULATORY_CARE_PROVIDER_SITE_OTHER): Payer: Managed Care, Other (non HMO) | Admitting: *Deleted

## 2013-05-17 DIAGNOSIS — I4891 Unspecified atrial fibrillation: Secondary | ICD-10-CM

## 2013-05-17 DIAGNOSIS — I428 Other cardiomyopathies: Secondary | ICD-10-CM

## 2013-05-17 LAB — LIPID PANEL
CHOLESTEROL: 269 mg/dL — AB (ref 0–200)
Cholesterol: 270 mg/dL — ABNORMAL HIGH (ref 0–200)
HDL: 34.8 mg/dL — AB (ref >39.00)
HDL: 35.5 mg/dL — ABNORMAL LOW (ref >39.00)
LDL CALC: 198 mg/dL — AB (ref 0–99)
LDL Cholesterol: 198 mg/dL — ABNORMAL HIGH (ref 0–99)
TRIGLYCERIDES: 180 mg/dL — AB (ref 0.0–149.0)
TRIGLYCERIDES: 185 mg/dL — AB (ref 0.0–149.0)
Total CHOL/HDL Ratio: 8
Total CHOL/HDL Ratio: 8
VLDL: 37 mg/dL (ref 0.0–40.0)
VLDL: 36 mg/dL (ref 0.0–40.0)

## 2013-05-17 LAB — HEPATIC FUNCTION PANEL
ALBUMIN: 4.4 g/dL (ref 3.5–5.2)
ALBUMIN: 4.4 g/dL (ref 3.5–5.2)
ALK PHOS: 64 U/L (ref 39–117)
ALT: 26 U/L (ref 0–53)
ALT: 27 U/L (ref 0–53)
AST: 22 U/L (ref 0–37)
AST: 24 U/L (ref 0–37)
Alkaline Phosphatase: 64 U/L (ref 39–117)
BILIRUBIN DIRECT: 0.2 mg/dL (ref 0.0–0.3)
BILIRUBIN TOTAL: 1.2 mg/dL (ref 0.3–1.2)
Bilirubin, Direct: 0.2 mg/dL (ref 0.0–0.3)
TOTAL PROTEIN: 7.6 g/dL (ref 6.0–8.3)
Total Bilirubin: 1.2 mg/dL (ref 0.3–1.2)
Total Protein: 7.6 g/dL (ref 6.0–8.3)

## 2013-05-17 LAB — BASIC METABOLIC PANEL
BUN: 18 mg/dL (ref 6–23)
CALCIUM: 9.4 mg/dL (ref 8.4–10.5)
CO2: 29 meq/L (ref 19–32)
Chloride: 102 mEq/L (ref 96–112)
Creatinine, Ser: 1.2 mg/dL (ref 0.4–1.5)
GFR: 63.88 mL/min (ref >60.00)
GLUCOSE: 87 mg/dL (ref 70–99)
POTASSIUM: 4.5 meq/L (ref 3.5–5.1)
SODIUM: 139 meq/L (ref 135–145)

## 2013-07-29 ENCOUNTER — Encounter: Payer: Self-pay | Admitting: Physician Assistant

## 2013-07-29 ENCOUNTER — Ambulatory Visit (INDEPENDENT_AMBULATORY_CARE_PROVIDER_SITE_OTHER): Payer: Managed Care, Other (non HMO) | Admitting: Physician Assistant

## 2013-07-29 VITALS — BP 151/82 | HR 55 | Temp 98.0°F | Resp 16 | Ht 67.5 in | Wt 202.0 lb

## 2013-07-29 DIAGNOSIS — M545 Low back pain, unspecified: Secondary | ICD-10-CM

## 2013-07-29 MED ORDER — CYCLOBENZAPRINE HCL 10 MG PO TABS: 10.0000 mg | ORAL_TABLET | Freq: Three times a day (TID) | ORAL | Status: DC | PRN

## 2013-07-29 MED ORDER — TRAMADOL HCL 50 MG PO TABS: 50.0000 mg | ORAL_TABLET | Freq: Four times a day (QID) | ORAL | Status: DC | PRN

## 2013-07-29 NOTE — Assessment & Plan Note (Signed)
Rx Tramadol for severe pain.  Switch for tylenol or ibuprofen once pain has reduced in severity.  Rs Flexeril for muscle spasm.  Topical Aspercreme. Avoid heavy lifting or overexertion.  Call or RTC if symptoms are not improving.

## 2013-07-29 NOTE — Patient Instructions (Signed)
Please take Tramadol as directed for severe pain.  Can resume Ibuprofen for mild pain once pain is improving.  Use Flexeril as directed for muscle spasm.  Please apply topical Icy Hot or Aspercreme.  Apply heating pad to area in 15-minute intervals.  Rest.  Call or return to clinic if symptoms are not improving.  Back Pain, Adult Low back pain is very common. About 1 in 5 people have back pain.The cause of low back pain is rarely dangerous. The pain often gets better over time.About half of people with a sudden onset of back pain feel better in just 2 weeks. About 8 in 10 people feel better by 6 weeks.  CAUSES Some common causes of back pain include:  Strain of the muscles or ligaments supporting the spine.  Wear and tear (degeneration) of the spinal discs.  Arthritis.  Direct injury to the back. DIAGNOSIS Most of the time, the direct cause of low back pain is not known.However, back pain can be treated effectively even when the exact cause of the pain is unknown.Answering your caregiver's questions about your overall health and symptoms is one of the most accurate ways to make sure the cause of your pain is not dangerous. If your caregiver needs more information, he or she may order lab work or imaging tests (X-rays or MRIs).However, even if imaging tests show changes in your back, this usually does not require surgery. HOME CARE INSTRUCTIONS For many people, back pain returns.Since low back pain is rarely dangerous, it is often a condition that people can learn to [facility] their own.   Remain active. It is stressful on the back to sit or stand in one place. Do not sit, drive, or stand in one place for more than 30 minutes at a time. Take short walks on level surfaces as soon as pain allows.Try to increase the length of time you walk each day.  Do not stay in bed.Resting more than 1 or 2 days can delay your recovery.  Do not avoid exercise or work.Your body is made to move.It is  not dangerous to be active, even though your back may hurt.Your back will likely heal faster if you return to being active before your pain is gone.  Pay attention to your body when you bend and lift. Many people have less discomfortwhen lifting if they bend their knees, keep the load close to their bodies,and avoid twisting. Often, the most comfortable positions are those that put less stress on your recovering back.  Find a comfortable position to sleep. Use a firm mattress and lie on your side with your knees slightly bent. If you lie on your back, put a pillow under your knees.  Only take over-the-counter or prescription medicines as directed by your caregiver. Over-the-counter medicines to reduce pain and inflammation are often the most helpful.Your caregiver may prescribe muscle relaxant drugs.These medicines help dull your pain so you can more quickly return to your normal activities and healthy exercise.  Put ice on the injured area.  Put ice in a plastic bag.  Place a towel between your skin and the bag.  Leave the ice on for 15-20 minutes, 03-04 times a day for the first 2 to 3 days. After that, ice and heat may be alternated to reduce pain and spasms.  Ask your caregiver about trying back exercises and gentle massage. This may be of some benefit.  Avoid feeling anxious or stressed.Stress increases muscle tension and can worsen back pain.It is important  to recognize when you are anxious or stressed and learn ways to manage it.Exercise is a great option. SEEK MEDICAL CARE IF:  You have pain that is not relieved with rest or medicine.  You have pain that does not improve in 1 week.  You have new symptoms.  You are generally not feeling well. SEEK IMMEDIATE MEDICAL CARE IF:   You have pain that radiates from your back into your legs.  You develop new bowel or bladder control problems.  You have unusual weakness or numbness in your arms or legs.  You develop nausea  or vomiting.  You develop abdominal pain.  You feel faint. Document Released: 02/14/2005 Document Revised: 08/16/2011 Document Reviewed: 07/05/2010 [facility] Patient Information 2014 [place], [place].

## 2013-07-29 NOTE — Progress Notes (Signed)
Patient presents to clinic today c/o low back pain x 3 days after some heavy lifting.  Denies radiation of pain into lower extremities. Denies weakness, tingling, numbness.  Has taken ibuprofen and Icy hot with some relief of symptoms.   Past Medical History  Diagnosis Date  . Persistent atrial fibrillation     a. s/p afib ablation 02/13/10. b. s/p TEE/ablation 09/2012.  Marland Kitchen Atrial flutter     s/p CTI ablation 2006 by Ladona Ridgel  . Sleep apnea   . Non-ischemic cardiomyopathy     Most recent EF 25% in 2014  . ALLERGIC RHINITIS    Current Outpatient Prescriptions on File Prior to Visit  Medication Sig Dispense Refill  . dabigatran (PRADAXA) 150 MG CAPS Take 1 capsule (150 mg total) by mouth 2 (two) times daily.  180 capsule  3  . ramipril (ALTACE) 10 MG capsule Take 1 capsule (10 mg total) by mouth daily.  90 capsule  3   No current facility-administered medications on file prior to visit.   Allergies  Allergen Reactions  . Penicillins     REACTION: tolerates amoxicillin    Family History  Problem Relation Age of Onset  . Lung cancer Father     smoker  . Lung cancer Mother     smoker  . Diabetes Neg Hx   . Prostate cancer Neg Hx   . Colon cancer Neg Hx     History   Social History  . Marital Status: Married    Spouse Name: N/A    Number of Children: 1  . 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 Smoking status: Never Smoker   . Smokeless tobacco: Never Used  . Alcohol Use: No  . Drug Use: No  . Sexual Activity: None   Other Topics Concern  . None   Social History Narrative   He is married and lives with his wife in [place].    Review of Systems - See HPI.  All other ROS are negative.  BP 151/82  Pulse 55  Temp(Src) 98 F (36.7 C) (Oral)  Resp 16  Ht 5' 7.5" (1.715 m)  Wt 202 lb (91.627 kg)  BMI 31.15 kg/m2  SpO2 96%  Physical Exam  Vitals reviewed. Constitutional: He is oriented to person, place,  and time and well-developed, well-nourished, and in no distress.  HENT:  Head: Normocephalic and atraumatic.  Cardiovascular: Normal rate, regular rhythm, normal heart sounds and intact distal pulses.   Pulmonary/Chest: Effort normal and breath sounds normal. No respiratory distress. He has no wheezes. He has no rales. He exhibits no tenderness.  Musculoskeletal:       Cervical back: Normal.       Thoracic back: Normal.       Lumbar back: He exhibits tenderness, pain and spasm. He exhibits normal range of motion, no bony tenderness, no swelling, no edema and no deformity.  Neurological: He is alert and oriented to person, place, and time.  Skin: Skin is warm and dry. No rash noted.  Psychiatric: Affect normal.   Recent Results (from the past 2160 hour(s))  BASIC METABOLIC PANEL     Status: None   Collection Time    05/17/13 12:40 PM      Result Value Ref Range   Sodium 139  135 - 145 mEq/L   Potassium 4.5  3.5 - 5.1 mEq/L   Chloride 102  96 - 112 mEq/L  CO2 29  19 - 32 mEq/L   Glucose, Bld 87  70 - 99 mg/dL   BUN 18  6 - 23 mg/dL   Creatinine, Ser 1.2  0.4 - 1.5 mg/dL   Calcium 9.4  8.4 - 81.110.5 mg/dL   GFR 91.4763.88  >82.95>60.00 mL/min  LIPID PANEL     Status: Abnormal   Collection Time    05/17/13 12:40 PM      Result Value Ref Range   Cholesterol 270 (*) 0 - 200 mg/dL   Comment: ATP III Classification       Desirable:  < 200 mg/dL               Borderline High:  200 - 239 mg/dL          High:  > = 621240 mg/dL   Triglycerides 308.6185.0 (*) 0.0 - 149.0 mg/dL   Comment: Normal:  <578<150 mg/dLBorderline High:  150 - 199 mg/dL   HDL 46.9634.80 (*) >29.52>39.00 mg/dL   VLDL 84.137.0  0.0 - 32.440.0 mg/dL   LDL Cholesterol 401198 (*) 0 - 99 mg/dL   Total CHOL/HDL Ratio 8     Comment:                Men          Women1/2 Average Risk     3.4          3.3Average Risk          5.0          4.42X Average Risk          9.6          7.13X Average Risk          15.0          11.0                      HEPATIC FUNCTION PANEL      Status: None   Collection Time    05/17/13 12:40 PM      Result Value Ref Range   Total Bilirubin 1.2  0.3 - 1.2 mg/dL   Bilirubin, Direct 0.2  0.0 - 0.3 mg/dL   Alkaline Phosphatase 64  39 - 117 U/L   AST 22  0 - 37 U/L   ALT 26  0 - 53 U/L   Total Protein 7.6  6.0 - 8.3 g/dL   Albumin 4.4  3.5 - 5.2 g/dL  LIPID PANEL     Status: Abnormal   Collection Time    05/17/13 12:40 PM      Result Value Ref Range   Cholesterol 269 (*) 0 - 200 mg/dL   Comment: ATP III Classification       Desirable:  < 200 mg/dL               Borderline High:  200 - 239 mg/dL          High:  > = 027240 mg/dL   Triglycerides 253.6180.0 (*) 0.0 - 149.0 mg/dL   Comment: Normal:  <644<150 mg/dLBorderline High:  150 - 199 mg/dL   HDL 03.4735.50 (*) >42.59>39.00 mg/dL   VLDL 56.336.0  0.0 - 87.540.0 mg/dL   LDL Cholesterol 643198 (*) 0 - 99 mg/dL   Total CHOL/HDL Ratio 8     Comment:                Men  Women1/2 Average Risk     3.4          3.3Average Risk          5.0          4.42X Average Risk          9.6          7.13X Average Risk          15.0          11.0                      HEPATIC FUNCTION PANEL     Status: None   Collection Time    05/17/13 12:40 PM      Result Value Ref Range   Total Bilirubin 1.2  0.3 - 1.2 mg/dL   Bilirubin, Direct 0.2  0.0 - 0.3 mg/dL   Alkaline Phosphatase 64  39 - 117 U/L   AST 24  0 - 37 U/L   ALT 27  0 - 53 U/L   Total Protein 7.6  6.0 - 8.3 g/dL   Albumin 4.4  3.5 - 5.2 g/dL   Assessment/Plan: Low back pain Rx Tramadol for severe pain.  Switch for tylenol or ibuprofen once pain has reduced in severity.  Rs Flexeril for muscle spasm.  Topical Aspercreme. Avoid heavy lifting or overexertion.  Call or RTC if symptoms are not improving.

## 2013-07-29 NOTE — Progress Notes (Signed)
Pre visit review using our clinic review tool, if applicable. No additional management support is needed unless otherwise documented below in the visit note/SLS  

## 2013-07-31 ENCOUNTER — Telehealth: Payer: Self-pay | Admitting: Internal Medicine

## 2013-07-31 DIAGNOSIS — M549 Dorsalgia, unspecified: Secondary | ICD-10-CM

## 2013-07-31 NOTE — Telephone Encounter (Signed)
Okay to place referral for back pain?

## 2013-07-31 NOTE — Telephone Encounter (Signed)
Caller name: [name]  Call back number:[phone]   Reason for call:  Pt was seen on 6/1 by Malva Cogan for back pain.  Pt states that he is still a lot of back pain and wants to get a referral to somebody to the back pain.  Please advise.

## 2013-07-31 NOTE — Telephone Encounter (Signed)
To ortho, please arrange

## 2013-08-01 NOTE — Telephone Encounter (Addendum)
Referral to ortho placed. Patient made aware that referral has been placed, he stated that the pain has become so severe that he is unable to walk. I encouraged him to seek treatment in the ER if the pain is at that point, he refused stating he would wait to see a specialist.

## 2013-08-21 ENCOUNTER — Other Ambulatory Visit (HOSPITAL_COMMUNITY): Payer: Self-pay | Admitting: Physician Assistant

## 2013-09-04 ENCOUNTER — Other Ambulatory Visit: Payer: Self-pay | Admitting: *Deleted

## 2013-09-04 ENCOUNTER — Other Ambulatory Visit: Payer: Self-pay | Admitting: Internal Medicine

## 2013-09-04 MED ORDER — RAMIPRIL 10 MG PO CAPS: 10.0000 mg | ORAL_CAPSULE | Freq: Every day | ORAL | Status: DC

## 2013-11-24 ENCOUNTER — Encounter: Payer: Self-pay | Admitting: Internal Medicine

## 2013-11-25 ENCOUNTER — Ambulatory Visit (INDEPENDENT_AMBULATORY_CARE_PROVIDER_SITE_OTHER): Payer: Managed Care, Other (non HMO) | Admitting: Internal Medicine

## 2013-11-25 ENCOUNTER — Encounter: Payer: Self-pay | Admitting: Internal Medicine

## 2013-11-25 VITALS — BP 126/82 | HR 42 | Ht 67.5 in | Wt 203.4 lb

## 2013-11-25 DIAGNOSIS — I498 Other specified cardiac arrhythmias: Secondary | ICD-10-CM

## 2013-11-25 DIAGNOSIS — I4819 Other persistent atrial fibrillation: Secondary | ICD-10-CM

## 2013-11-25 DIAGNOSIS — I519 Heart disease, unspecified: Secondary | ICD-10-CM

## 2013-11-25 DIAGNOSIS — I4891 Unspecified atrial fibrillation: Secondary | ICD-10-CM

## 2013-11-25 DIAGNOSIS — R001 Bradycardia, unspecified: Secondary | ICD-10-CM

## 2013-11-25 NOTE — Progress Notes (Signed)
PCP:  Adam Ora, MD  The patient presents today for routine electrophysiology follow up, last ablation 8/14.  Since last being seen in our clinic, the patient reports doing very well.He reports no symptoms of afib. Continues to have asymptomatic bradycardia.Continues on Noac with chadsvasc of 1. 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 Pt. given option to stop Noac and he would like to do so.  Today, he denies symptoms of palpitations, chest pain, shortness of breath, orthopnea, PND, lower extremity edema, dizziness, presyncope, syncope, or neurologic sequela.  The patient feels that he is tolerating medications without difficulties and is otherwise without complaint today.   Past Medical History  Diagnosis Date  . Persistent atrial fibrillation     a. s/p afib ablation 12/17/1 and redo blation 09/2012.  Marland Kitchen Atrial flutter     s/p CTI ablation 2006 by Ladona Ridgel  . Sleep apnea   . Non-ischemic cardiomyopathy     Most recent EF 25% in 2014  . ALLERGIC RHINITIS    Past Surgical History  Procedure Laterality Date  . Atrial ablation surgery  2006    CTI ablation  . Joint replacement      toe joint  . Tonsillectomy    . Atrial fibrillation ablation  02/13/10, 8,28,14  . Tee without cardioversion N/A 10/25/2012    Procedure: TRANSESOPHAGEAL ECHOCARDIOGRAM (TEE);  Surgeon: Lewayne Bunting, MD;  Location: [facility] ENDOSCOPY;  Service: Cardiovascular;  Laterality: N/A;    Current Outpatient Prescriptions  Medication Sig Dispense Refill  . cyclobenzaprine (FLEXERIL) 10 MG tablet Take 1 tablet (10 mg total) by mouth 3 (three) times daily as needed for muscle spasms.  30 tablet  0  . PRADAXA 150 MG CAPS capsule TAKE 1 CAPSULE (150 MG TOTAL) BY MOUTH 2 (TWO) TIMES DAILY.  180 capsule  2  . ramipril (ALTACE) 10 MG capsule Take 1 capsule (10 mg total) by mouth daily.  90 capsule  0  . traMADol (ULTRAM) 50 MG tablet  Take 50 mg by mouth every 6 (six) hours as needed (pain).       No current facility-administered medications for this visit.    Allergies  Allergen Reactions  . Penicillins Other (See Comments)    Syncope (Tolerates amoxicillin)    History   Social History  . Marital Status: Married    Spouse Name: N/A    Number of Children: 1  . 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 Smoking status: Never Smoker   . Smokeless tobacco: Never Used  . Alcohol Use: No  . Drug Use: No  . Sexual Activity: Not on file   Other Topics Concern  . Not on file   Social History Narrative   He is married and lives with his wife in [place].     Family History  Problem Relation Age of Onset  . Lung cancer Father     smoker  . Lung cancer Mother     smoker  . Diabetes Neg Hx   . Prostate cancer Neg Hx   . Colon cancer Neg Hx     ROS-  All systems are reviewed and are negative except as outlined in the HPI above  Physical Exam: Filed Vitals:   11/25/13 1147  BP: 126/82  Pulse: 42  Height: 5' 7.5" (1.715 m)  Weight: 203 lb 6.4  oz (92.262 kg)    GEN- The patient is well appearing, alert and oriented x 3 today.   Head- normocephalic, atraumatic Eyes-  Sclera clear, conjunctiva pink Ears- hearing intact Oropharynx- clear Neck- supple, no JVP Lymph- no cervical lymphadenopathy Lungs- Clear to ausculation bilaterally, normal work of breathing Heart- Regular rate and rhythm, no murmurs, rubs or gallops, PMI not laterally displaced GI- soft, NT, ND, + BS Extremities- no clubbing, cyanosis, or edema Neuro- strength and sensation are intact  EKG today reveals sinus bradycardia at 42 bpm., diffuse tw changes, unchanged from previous. Echo is reviewed, LA moderately dilated, EF 50-55% with grade 2 diastolic dysfunction.  Assessment and Plan:  1. Persistent afib Maintaining sinus despite LA enlargement post ablation off of  AAD Chadsvasc score is 1.  He would like to stop anticoagulation at this time.  2. htn  BP in normal range.  3. Tachycardia mediated CM Resolved with sinus rhythm  Return in 1 year.

## 2013-12-11 ENCOUNTER — Other Ambulatory Visit: Payer: Self-pay | Admitting: Internal Medicine

## 2014-02-06 ENCOUNTER — Encounter (HOSPITAL_COMMUNITY): Payer: Self-pay | Admitting: Internal Medicine

## 2014-07-18 ENCOUNTER — Encounter (HOSPITAL_COMMUNITY): Payer: Self-pay | Admitting: Emergency Medicine

## 2014-07-18 ENCOUNTER — Emergency Department (HOSPITAL_COMMUNITY): Payer: Managed Care, Other (non HMO)

## 2014-07-18 ENCOUNTER — Emergency Department (HOSPITAL_COMMUNITY)
Admission: EM | Admit: 2014-07-18 | Discharge: 2014-07-18 | Disposition: A | Discharge status: Home / Self Care | Payer: Managed Care, Other (non HMO) | Attending: Emergency Medicine | Admitting: Emergency Medicine

## 2014-07-18 DIAGNOSIS — Z8709 Personal history of other diseases of the respiratory system: Secondary | ICD-10-CM | POA: Insufficient documentation

## 2014-07-18 DIAGNOSIS — R000 Tachycardia, unspecified: Secondary | ICD-10-CM | POA: Diagnosis not present

## 2014-07-18 DIAGNOSIS — R002 Palpitations: Secondary | ICD-10-CM

## 2014-07-18 DIAGNOSIS — Z880 Allergy status to penicillin: Secondary | ICD-10-CM | POA: Diagnosis not present

## 2014-07-18 DIAGNOSIS — Z8669 Personal history of other diseases of the nervous system and sense organs: Secondary | ICD-10-CM | POA: Diagnosis not present

## 2014-07-18 DIAGNOSIS — Z79899 Other long term (current) drug therapy: Secondary | ICD-10-CM | POA: Insufficient documentation

## 2014-07-18 DIAGNOSIS — Z8679 Personal history of other diseases of the circulatory system: Secondary | ICD-10-CM | POA: Diagnosis not present

## 2014-07-18 DIAGNOSIS — Z7902 Long term (current) use of antithrombotics/antiplatelets: Secondary | ICD-10-CM | POA: Insufficient documentation

## 2014-07-18 LAB — I-STAT TROPONIN, ED
TROPONIN I, POC: 0.02 ng/mL (ref 0.00–0.08)
Troponin i, poc: 0.01 ng/mL (ref 0.00–0.08)

## 2014-07-18 LAB — I-STAT CHEM 8, ED
BUN: 21 mg/dL — AB (ref 6–20)
Calcium, Ion: 1.13 mmol/L (ref 1.12–1.23)
Chloride: 108 mmol/L (ref 101–111)
Creatinine, Ser: 1.1 mg/dL (ref 0.61–1.24)
Glucose, Bld: 121 mg/dL — ABNORMAL HIGH (ref 65–99)
HCT: 49 % (ref 39.0–52.0)
HEMOGLOBIN: 16.7 g/dL (ref 13.0–17.0)
Potassium: 4.3 mmol/L (ref 3.5–5.1)
SODIUM: 142 mmol/L (ref 135–145)
TCO2: 25 mmol/L (ref 0–100)

## 2014-07-18 LAB — URINALYSIS, ROUTINE W REFLEX MICROSCOPIC
Bilirubin Urine: NEGATIVE
GLUCOSE, UA: NEGATIVE mg/dL
Hgb urine dipstick: NEGATIVE
Ketones, ur: NEGATIVE mg/dL
LEUKOCYTES UA: NEGATIVE
NITRITE: NEGATIVE
PROTEIN: NEGATIVE mg/dL
Specific Gravity, Urine: 1.014 (ref 1.005–1.030)
Urobilinogen, UA: 0.2 mg/dL (ref 0.0–1.0)
pH: 6 (ref 5.0–8.0)

## 2014-07-18 LAB — CBC WITH DIFFERENTIAL/PLATELET
Basophils Absolute: 0 10*3/uL (ref 0.0–0.1)
Basophils Relative: 1 % (ref 0–1)
EOS PCT: 3 % (ref 0–5)
Eosinophils Absolute: 0.2 10*3/uL (ref 0.0–0.7)
HEMATOCRIT: 48 % (ref 39.0–52.0)
HEMOGLOBIN: 16.3 g/dL (ref 13.0–17.0)
LYMPHS ABS: 1.8 10*3/uL (ref 0.7–4.0)
Lymphocytes Relative: 28 % (ref 12–46)
MCH: 31.1 pg (ref 26.0–34.0)
MCHC: 34 g/dL (ref 30.0–36.0)
MCV: 91.6 fL (ref 78.0–100.0)
MONO ABS: 0.7 10*3/uL (ref 0.1–1.0)
MONOS PCT: 11 % (ref 3–12)
Neutro Abs: 3.7 10*3/uL (ref 1.7–7.7)
Neutrophils Relative %: 57 % (ref 43–77)
Platelets: 195 10*3/uL (ref 150–400)
RBC: 5.24 MIL/uL (ref 4.22–5.81)
RDW: 14 % (ref 11.5–15.5)
WBC: 6.4 10*3/uL (ref 4.0–10.5)

## 2014-07-18 LAB — TSH: TSH: 4.956 u[IU]/mL — ABNORMAL HIGH (ref 0.350–4.500)

## 2014-07-18 LAB — MAGNESIUM: MAGNESIUM: 2.4 mg/dL (ref 1.7–2.4)

## 2014-07-18 MED ORDER — SODIUM CHLORIDE 0.9 % IV BOLUS (SEPSIS)
1000.0000 mL | Freq: Once | INTRAVENOUS | Status: AC
  Administered 2014-07-18: 1000 mL via INTRAVENOUS

## 2014-07-18 NOTE — Discharge Instructions (Signed)
Atrial Fibrillation °Atrial fibrillation is a type of irregular heart rhythm (arrhythmia). During atrial fibrillation, the upper chambers of the heart (atria) quiver continuously in a chaotic pattern. This causes an irregular and often rapid heart rate.  °Atrial fibrillation is the result of the heart becoming overloaded with disorganized signals that tell it to beat. These signals are normally released one at a time by a part of the right atrium called the sinoatrial node. They then travel from the atria to the lower chambers of the heart (ventricles), causing the atria and ventricles to contract and pump blood as they pass. In atrial fibrillation, parts of the atria outside of the sinoatrial node also release these signals. This results in two problems. First, the atria receive so many signals that they do not have time to fully contract. Second, the ventricles, which can only receive one signal at a time, beat irregularly and out of rhythm with the atria.  °There are three types of atrial fibrillation:  °· Paroxysmal. Paroxysmal atrial fibrillation starts suddenly and stops on its own within a week. °· Persistent. Persistent atrial fibrillation lasts for more than a week. It may stop on its own or with treatment. °· Permanent. Permanent atrial fibrillation does not go away. Episodes of atrial fibrillation may lead to permanent atrial fibrillation. °Atrial fibrillation can prevent your heart from pumping blood normally. It increases your risk of stroke and can lead to heart failure.  °CAUSES  °· Heart conditions, including a heart attack, heart failure, coronary artery disease, and heart valve conditions.   °· Inflammation of the sac that surrounds the heart (pericarditis). °· Blockage of an artery in the lungs (pulmonary embolism). °· Pneumonia or other infections. °· Chronic lung disease. °· Thyroid problems, especially if the thyroid is overactive (hyperthyroidism). °· Caffeine, excessive alcohol use, and use  of some illegal drugs.   °· Use of some medicines, including certain decongestants and diet pills. °· Heart surgery.   °· Birth defects.   °Sometimes, no cause can be found. When this happens, the atrial fibrillation is called lone atrial fibrillation. The risk of complications from atrial fibrillation increases if you have lone atrial fibrillation and you are age 60 years or older. °RISK FACTORS °· Heart failure. °· Coronary artery disease. °· Diabetes mellitus.   °· High blood pressure (hypertension).   °· Obesity.   °· Other arrhythmias.   °· Increased age. °SIGNS AND SYMPTOMS  °· A feeling that your heart is beating rapidly or irregularly.   °· A feeling of discomfort or pain in your chest.   °· Shortness of breath.   °· Sudden light-headedness or weakness.   °· Getting tired easily when exercising.   °· Urinating more often than normal (mainly when atrial fibrillation first begins).   °In paroxysmal atrial fibrillation, symptoms may start and suddenly stop. °DIAGNOSIS  °Your health care provider may be able to detect atrial fibrillation when taking your pulse. Your health care provider may have you take a test called an ambulatory electrocardiogram (ECG). An ECG records your heartbeat patterns over a 24-hour period. You may also have other tests, such as: °· Transthoracic echocardiogram (TTE). During echocardiography, sound waves are used to evaluate how blood flows through your heart. °· Transesophageal echocardiogram (TEE). °· Stress test. There is more than one type of stress test. If a stress test is needed, ask your health care provider about which type is best for you. °· Chest X-ray exam. °· Blood tests. °· Computed tomography (CT). °TREATMENT  °Treatment may include: °· Treating any underlying conditions. For example, if you   have an overactive thyroid, treating the condition may correct atrial fibrillation. °· Taking medicine. Medicines may be given to control a rapid heart rate or to prevent blood  clots, heart failure, or a stroke. °· Having a procedure to correct the rhythm of the heart: °· Electrical cardioversion. During electrical cardioversion, a controlled, low-energy shock is delivered to the heart through your skin. If you have chest pain, very low blood pressure, or sudden heart failure, this procedure may need to be done as an emergency. °· Catheter ablation. During this procedure, heart tissues that send the signals that cause atrial fibrillation are destroyed. °· Surgical ablation. During this surgery, thin lines of heart tissue that carry the abnormal signals are destroyed. This procedure can either be an open-heart surgery or a minimally invasive surgery. With the minimally invasive surgery, small cuts are made to access the heart instead of a large opening. °· Pulmonary venous isolation. During this surgery, tissue around the veins that carry blood from the lungs (pulmonary veins) is destroyed. This tissue is thought to carry the abnormal signals. °HOME CARE INSTRUCTIONS  °· Take medicines only as directed by your health care provider. Some medicines can make atrial fibrillation worse or recur. °· If blood thinners were prescribed by your health care provider, take them exactly as directed. Too much blood-thinning medicine can cause bleeding. If you take too little, you will not have the needed protection against stroke and other problems. °· Perform blood tests at home if directed by your health care provider. Perform blood tests exactly as directed. °· Quit smoking if you smoke. °· Do not drink alcohol. °· Do not drink caffeinated beverages such as coffee, soda, and some teas. You may drink decaffeinated coffee, soda, or tea.   °· Maintain a healthy weight. Do not use diet pills unless your health care provider approves. They may make heart problems worse.   °· Follow diet instructions as directed by your health care provider. °· Exercise regularly as directed by your health care  provider. °· Keep all follow-up visits as directed by your health care provider. This is important. °PREVENTION  °The following substances can cause atrial fibrillation to recur:  °· Caffeinated beverages. °· Alcohol. °· Certain medicines, especially those used for breathing problems. °· Certain herbs and herbal medicines, such as those containing ephedra or ginseng. °· Illegal drugs, such as cocaine and amphetamines. °Sometimes medicines are given to prevent atrial fibrillation from recurring. Proper treatment of any underlying condition is also important in helping prevent recurrence.  °SEEK MEDICAL CARE IF: °· You notice a change in the rate, rhythm, or strength of your heartbeat. °· You suddenly begin urinating more frequently. °· You tire more easily when exerting yourself or exercising. °SEEK IMMEDIATE MEDICAL CARE IF:  °· You have chest pain, abdominal pain, sweating, or weakness. °· You feel nauseous. °· You have shortness of breath. °· You suddenly have swollen feet and ankles. °· You feel dizzy. °· Your face or limbs feel numb or weak. °· You have a change in your vision or speech. °MAKE SURE YOU:  °· Understand these instructions. °· Will watch your condition. °· Will get help right away if you are not doing well or get worse. °Document Released: 02/14/2005 Document Revised: 07/01/2013 Document Reviewed: 03/27/2012 °[employer]® Patient Information ©2015 [employer]. This information is not intended to replace advice given to you by your health care provider. Make sure you discuss any questions you have with your health care provider. ° °Palpitations °A palpitation is the   feeling that your heartbeat is irregular or is faster than normal. It may feel like your heart is fluttering or skipping a beat. Palpitations are usually not a serious problem. However, in some cases, you may need further medical evaluation. °CAUSES  °Palpitations can be caused by: °· Smoking. °· Caffeine or other stimulants, such as diet  pills or energy drinks. °· Alcohol. °· Stress and anxiety. °· Strenuous physical activity. °· Fatigue. °· Certain medicines. °· Heart disease, especially if you have a history of irregular heart rhythms (arrhythmias), such as atrial fibrillation, atrial flutter, or supraventricular tachycardia. °· An improperly working pacemaker or defibrillator. °DIAGNOSIS  °To find the cause of your palpitations, your health care provider will take your medical history and perform a physical exam. Your health care provider may also have you take a test called an ambulatory electrocardiogram (ECG). An ECG records your heartbeat patterns over a 24-hour period. You may also have other tests, such as: °· Transthoracic echocardiogram (TTE). During echocardiography, sound waves are used to evaluate how blood flows through your heart. °· Transesophageal echocardiogram (TEE). °· Cardiac monitoring. This allows your health care provider to monitor your heart rate and rhythm in real time. °· Holter monitor. This is a portable device that records your heartbeat and can help diagnose heart arrhythmias. It allows your health care provider to track your heart activity for several days, if needed. °· Stress tests by exercise or by giving medicine that makes the heart beat faster. °TREATMENT  °Treatment of palpitations depends on the cause of your symptoms and can vary greatly. Most cases of palpitations do not require any treatment other than time, relaxation, and monitoring your symptoms. Other causes, such as atrial fibrillation, atrial flutter, or supraventricular tachycardia, usually require further treatment. °HOME CARE INSTRUCTIONS  °· Avoid: °¨ Caffeinated coffee, tea, soft drinks, diet pills, and energy drinks. °¨ Chocolate. °¨ Alcohol. °· Stop smoking if you smoke. °· Reduce your stress and anxiety. Things that can help you relax include: °¨ A method of controlling things in your body, such as your heartbeats, with your mind  (biofeedback). °¨ Yoga. °¨ Meditation. °¨ Physical activity such as swimming, jogging, or walking. °· Get plenty of rest and sleep. °SEEK MEDICAL CARE IF:  °· You continue to have a fast or irregular heartbeat beyond 24 hours. °· Your palpitations occur more often. °SEEK IMMEDIATE MEDICAL CARE IF: °· You have chest pain or shortness of breath. °· You have a severe headache. °· You feel dizzy or you faint. °MAKE SURE YOU: °· Understand these instructions. °· Will watch your condition. °· Will get help right away if you are not doing well or get worse. °Document Released: 02/12/2000 Document Revised: 02/19/2013 Document Reviewed: 04/15/2011 °[employer]® Patient Information ©2015 [employer]. This information is not intended to replace advice given to you by your health care provider. Make sure you discuss any questions you have with your health care provider. ° °

## 2014-07-18 NOTE — ED Notes (Addendum)
While at bedside pts rhythm converted to sinus brady; MD notified; EKG performed and put in chart; pt CAOx4, denies pain, sob, VSS

## 2014-07-18 NOTE — ED Provider Notes (Signed)
This chart was scribed for Layla Maw Ward, DO by Lyndel Safe, ED Scribe. This patient was seen in room A05C/A05C and the patient's care was started 2:30 AM.  TIME SEEN: 2:30 AM  CHIEF COMPLAINT: tachycardia  HPI:  HPI Comments: [name] is a 59 y.o. male with a past medical history of A-fib and atrial flutter status post ablation, nonischemic cardiomyopathy with an ejection fraction of 55% in December 2014 who presents to the Emergency Department complaining of increased heart rate that began two hours ago with associated diaphoresis and tingling/paresthesias in left arm. At time of onset pt was sleeping. Took 150 mg of Pradaxa and 25 mg of Coreg which he says alleviated tachycardia symptoms. He reports his heart rate was in the 200s but is now in the 130s and is sinus tachycardia. Pt denies associated fever, chest pain, SOB, melena, hematochezia, diarrhea, or vomiting.  Cardiologist is Dr. Johney Frame MD. PCP is Dr. Daphine Deutscher MD. Pt denies history of SVT, DVT, or PE. Last ablation was August 2014. He has not on any medication to control his heart rate as he says he has not had any problems with it and almost 2 years and previously to when he was on a beta blocker his heart rate would drop into the 30s.   ROS: See HPI Constitutional: no fever  Eyes: no drainage  ENT: no runny nose   Cardiovascular:  no chest pain  Resp: no SOB  GI: no vomiting GU: no dysuria Integumentary: no rash  Allergy: no hives  Musculoskeletal: no leg swelling  Neurological: no slurred speech ROS otherwise negative  PAST MEDICAL HISTORY/PAST SURGICAL HISTORY:  Past Medical History  Diagnosis Date  . Persistent atrial fibrillation     a. s/p afib ablation 12/17/1 and redo blation 09/2012.  Marland Kitchen Atrial flutter     s/p CTI ablation 2006 by Ladona Ridgel  . Sleep apnea   . Non-ischemic cardiomyopathy     Most recent EF 25% in 2014  . ALLERGIC RHINITIS     MEDICATIONS:  Prior to Admission medications   Medication Sig  Start Date End Date Taking? Authorizing Provider  cyclobenzaprine (FLEXERIL) 10 MG tablet Take 1 tablet (10 mg total) by mouth 3 (three) times daily as needed for muscle spasms. 07/29/13   Waldon Merl, PA-C  PRADAXA 150 MG CAPS capsule TAKE 1 CAPSULE (150 MG TOTAL) BY MOUTH 2 (TWO) TIMES DAILY. 09/04/13   Hillis Range, MD  ramipril (ALTACE) 10 MG capsule TAKE 1 CAPSULE (10 MG TOTAL) BY MOUTH DAILY. 12/12/13   Hillis Range, MD  traMADol (ULTRAM) 50 MG tablet Take 50 mg by mouth every 6 (six) hours as needed (pain). 07/29/13   Waldon Merl, PA-C    ALLERGIES:  Allergies  Allergen Reactions  . Penicillins Other (See Comments)    Syncope (Tolerates amoxicillin)    SOCIAL HISTORY:  History  Substance Use Topics  . Smoking status: Never Smoker   . Smokeless tobacco: Never Used  . Alcohol Use: No    FAMILY HISTORY: Family History  Problem Relation Age of Onset  . Lung cancer Father     smoker  . Lung cancer Mother     smoker  . Diabetes Neg Hx   . Prostate cancer Neg Hx   . Colon cancer Neg Hx     EXAM: Triage Vitals: BP 99/81 mmHg  Pulse 133  Temp(Src) 97.9 F (36.6 C)  Resp 20  Wt 210 lb (95.255 kg)  SpO2 97%  CONSTITUTIONAL: Alert and oriented and responds appropriately to questions. Well-appearing; well-nourished, pleasant, in no distress HEAD: Normocephalic EYES: Conjunctivae clear, PERRL ENT: normal nose; no rhinorrhea; moist mucous membranes; pharynx without lesions noted NECK: Supple, no meningismus, no LAD  CARD: Regular and tachycardic; S1 and S2 appreciated; no murmurs, no clicks, no rubs, no gallops RESP: Normal chest excursion without splinting or tachypnea; breath sounds clear and equal bilaterally; no wheezes, no rhonchi, no rales, no hypoxia or respiratory distress, speaking full sentences ABD/GI: Normal bowel sounds; non-distended; soft, non-tender, no rebound, no guarding, no peritoneal signs BACK:  The back appears normal and is non-tender to  palpation, there is no CVA tenderness EXT: Normal ROM in all joints; non-tender to palpation; no edema; normal capillary refill; no cyanosis, no calf tenderness or swelling    SKIN: Normal color for age and race; warm NEURO: Moves all extremities equally, sensation to light touch intact diffusely, cranial nerves II through XII intact PSYCH: The patient's mood and manner are appropriate. Grooming and personal hygiene are appropriate.  MEDICAL DECISION MAKING: Patient here with sinus tachycardia and mild hypotension. Labs ordered in triage show no significant abnormality. Normal hemoglobin. Normal electrolytes. Will check rectal temperature, TSH, urine and chest x-ray for signs of infection. We'll give IV fluids and continue to closely monitor. Troponin is negative.  ED PROGRESS: Patient had a brief pause that last approximately 3-5 seconds and then went into a sinus bradycardia with heart rate in the 40s which she reports is his baseline. He reports he is now feeling much better. His rectal temperature is normal. Chest x-ray shows no infiltrate. Urine shows no sign of infection. Magnesium normal at 2.4. We'll continue to monitor and repeat troponin at 4:30 AM.   Second troponin negative. Heart rate remains in the upper 40s and he reports his blood pressure is normally in the 100 systolic and has also been stable. He reports he is feeling well and ready for discharge. Have advised him to follow-up with his cardiologist. Given he has had extreme bradycardia with beta blockers in the past will not restart these medications. Discussed strict return precautions. He verbalizes understanding and is comfortable with plan.   EKG Interpretation  Date/Time:  Friday Jul 18 2014 01:21:51 EDT Ventricular Rate:  134 PR Interval:  124 QRS Duration: 88 QT Interval:  296 QTC Calculation: 442 R Axis:   -80 Text Interpretation:  Sinus tachycardia Left axis deviation Pulmonary disease pattern Nonspecific ST and T  wave abnormality Abnormal ECG Confirmed by WARD,  DO, KRISTEN [phone]) on 07/18/2014 1:51:29 AM       EKG Interpretation  Date/Time:  Friday Jul 18 2014 02:51:01 EDT Ventricular Rate:  131 PR Interval:  91 QRS Duration: 90 QT Interval:  330 QTC Calculation: 487 R Axis:   -84 Text Interpretation:  Sinus tachycardia Left anterior fascicular block Consider anterior infarct Repol abnrm suggests ischemia, diffuse leads Confirmed by WARD,  DO, KRISTEN [phone]) on 07/18/2014 4:03:17 AM        EKG Interpretation  Date/Time:  Friday Jul 18 2014 03:18:08 EDT Ventricular Rate:  46 PR Interval:  177 QRS Duration: 116 QT Interval:  522 QTC Calculation: 457 R Axis:   -69 Text Interpretation:  Sinus bradycardia Left anterior fascicular block Probable anterior infarct, age indeterminate Confirmed by WARD,  DO, KRISTEN ([id number]) on 07/18/2014 4:04:05 AM        CRITICAL CARE Performed by: Raelyn Number   Total critical care time: 45 minutes   Critical  care time was exclusive of separately billable procedures and treating other patients.  Critical care was necessary to treat or prevent imminent or life-threatening deterioration.  Critical care was time spent personally by me on the following activities: development of treatment plan with patient and/or surrogate as well as nursing, discussions with consultants, evaluation of patient's response to treatment, examination of patient, obtaining history from patient or surrogate, ordering and performing treatments and interventions, ordering and review of laboratory studies, ordering and review of radiographic studies, pulse oximetry and re-evaluation of patient's condition.     I personally performed the services described in this documentation, which was scribed in my presence. The recorded information has been reviewed and is accurate.    Layla Maw Ward, DO 07/18/14 [phone]

## 2014-07-18 NOTE — ED Notes (Signed)
Pt st's he noticed heart beating fast approx 2 hours ago.  St's it was also irregular.  Pt denies any chest pain.

## 2014-07-21 ENCOUNTER — Telehealth (HOSPITAL_COMMUNITY): Payer: Self-pay | Admitting: *Deleted

## 2014-07-21 NOTE — Telephone Encounter (Signed)
Left message for pt to call back for follow up appt

## 2014-07-21 NOTE — Telephone Encounter (Signed)
-----   Message from Hillis Range, MD sent at 07/18/2014  8:30 PM EDT ----- Regarding: follow-up Needs follow-up this week after ER visit for afib

## 2014-07-29 ENCOUNTER — Ambulatory Visit (HOSPITAL_COMMUNITY)
Admission: RE | Admit: 2014-07-29 | Discharge: 2014-07-29 | Disposition: A | Discharge status: Home / Self Care | Payer: Managed Care, Other (non HMO) | Source: Ambulatory Visit | Attending: Nurse Practitioner | Admitting: Nurse Practitioner

## 2014-07-29 ENCOUNTER — Encounter (HOSPITAL_COMMUNITY): Payer: Self-pay | Admitting: Nurse Practitioner

## 2014-07-29 VITALS — BP 148/100 | HR 47 | Ht 67.0 in | Wt 209.6 lb

## 2014-07-29 DIAGNOSIS — I4819 Other persistent atrial fibrillation: Secondary | ICD-10-CM

## 2014-07-29 DIAGNOSIS — I10 Essential (primary) hypertension: Secondary | ICD-10-CM | POA: Insufficient documentation

## 2014-07-29 DIAGNOSIS — R000 Tachycardia, unspecified: Secondary | ICD-10-CM | POA: Insufficient documentation

## 2014-07-29 DIAGNOSIS — I481 Persistent atrial fibrillation: Secondary | ICD-10-CM | POA: Insufficient documentation

## 2014-07-29 MED ORDER — CARVEDILOL 12.5 MG PO TABS: 6.2500 mg | ORAL_TABLET | Freq: Every day | ORAL | Status: DC

## 2014-07-29 NOTE — Progress Notes (Signed)
Patient ID: [name], male   DOB: [birth year], 59 y.o.   MRN: [id number]      PCP:  Willow Ora, MD  The patient presents today for  follow up in the afib clinic, last ablation 8/14.  The pt had breakthrough afib and presented to the ER 5/20. He spontaneously converted in the ER. He believes the trigger was indigestion from watermelon.  He took 25 mg carvedilol and a dose of pradaxa both of which had been discontinued at time of ablation. Marland KitchenHe reports no symptoms of afib. Continues to have asymptomatic bradycardia.Continues on Noac with chadsvasc of 1. Previously had reduce d EF at 25%, thought secondary to  tachycardia mediated cardiomyopathy,  but by echo 12/24 normalized at 50-55%. He has continued on coreg 12.5 mg qd since the episode of afib until seen here today. He has a brady at 47 bpm, but is asymptomatic, states he HR usually low.  Today, he denies symptoms of palpitations, chest pain, shortness of breath, orthopnea, PND, lower extremity edema, dizziness, presyncope, syncope, or neurologic sequela.  The patient feels that he is tolerating medications without difficulties and is otherwise without complaint today.   Past Medical History  Diagnosis Date  . Persistent atrial fibrillation     a. s/p afib ablation 12/17/1 and redo blation 09/2012.  Marland Kitchen Atrial flutter     s/p CTI ablation 2006 by Ladona Ridgel  . Sleep apnea   . Non-ischemic cardiomyopathy     Most recent EF 25% in 2014  . ALLERGIC RHINITIS    Past Surgical History  Procedure Laterality Date  . Atrial ablation surgery  2006    CTI ablation  . Joint replacement      toe joint  . Tonsillectomy    . Atrial fibrillation ablation  02/13/10, 8,28,14  . Tee without cardioversion N/A 10/25/2012    Procedure: TRANSESOPHAGEAL ECHOCARDIOGRAM (TEE);  Surgeon: Lewayne Bunting, MD;  Location[facility] ENDOSCOPY;  Service: Cardiovascular;  Laterality: N/A;  . Atrial fibrillation ablation N/A 10/26/2012    Procedure: ATRIAL FIBRILLATION  ABLATION;  Surgeon: Hillis Range, MD;  Location: [facility] CATH LAB;  Service: Cardiovascular;  Laterality: N/A;    Current Outpatient Prescriptions  Medication Sig Dispense Refill  . carvedilol (COREG) 12.5 MG tablet Take 0.5 tablets (6.25 mg total) by mouth daily.    . ramipril (ALTACE) 10 MG capsule TAKE 1 CAPSULE (10 MG TOTAL) BY MOUTH DAILY. 90 capsule 3   No current facility-administered medications for this encounter.    Allergies  Allergen Reactions  . Penicillins Other (See Comments)    Syncope (Tolerates amoxicillin)    History   Social History  . Marital Status: Married    Spouse Name: N/A  . Number of Children: 1  . 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 Smoking status: Never Smoker   . Smokeless tobacco: Never Used  . Alcohol Use: No  . Drug Use: No  . Sexual Activity: Not on file   Other Topics Concern  . Not on file   Social History Narrative   He is married and lives with his wife in [place].     Family History  Problem Relation Age of Onset  . Lung cancer Father     smoker  . Lung cancer Mother     smoker  . Diabetes Neg Hx   . Prostate cancer Neg Hx   . Colon cancer Neg Hx  ROS-  All systems are reviewed and are negative except as outlined in the HPI above  Physical Exam: Filed Vitals:   07/29/14 1505  BP: 148/100  Pulse: 47  Height: 5\' 7"  (1.702 m)  Weight: 209 lb 9.6 oz (95.074 kg)    GEN- The patient is well appearing, alert and oriented x 3 today.   Head- normocephalic, atraumatic Eyes-  Sclera clear, conjunctiva pink Ears- hearing intact Oropharynx- clear Neck- supple, no JVP Lymph- no cervical lymphadenopathy Lungs- Clear to ausculation bilaterally, normal work of breathing Heart- Slow,regular rate and rhythm, no murmurs, rubs or gallops, PMI not laterally displaced GI- soft, NT, ND, + BS Extremities- no clubbing, cyanosis, or edema Neuro- strength and sensation are  intact  EKG today reveals sinus bradycardia at 47 bpm., diffuse tw changes, unchanged from previous. Echo is reviewed, LA moderately dilated, EF 50-55% with grade 2 diastolic dysfunction.  Assessment and Plan:  1. Persistent afib Maintaining sinus , since episode of 5/20. Chadsvasc score is 1.  Continue off OAC. Try to decrease coreg to 6.25 mg qd for a week and then stop.  2. HTN BP recheck 140/84  3. Tachycardia mediated CM Resolved with sinus rhythm  F/u in afib clinc in 3 months

## 2014-07-29 NOTE — Patient Instructions (Signed)
Your physician has recommended you make the following change in your medication:  1)decrease coreg to 6.25mg  for next few weeks and if heart rate stable can stop  Parking code for august 0008

## 2014-10-27 ENCOUNTER — Ambulatory Visit (HOSPITAL_COMMUNITY): Payer: Managed Care, Other (non HMO) | Admitting: Nurse Practitioner

## 2014-10-29 ENCOUNTER — Ambulatory Visit (HOSPITAL_COMMUNITY)
Admission: RE | Admit: 2014-10-29 | Discharge: 2014-10-29 | Disposition: A | Discharge status: Home / Self Care | Payer: Managed Care, Other (non HMO) | Source: Ambulatory Visit | Attending: Nurse Practitioner | Admitting: Nurse Practitioner

## 2014-10-29 DIAGNOSIS — I10 Essential (primary) hypertension: Secondary | ICD-10-CM | POA: Insufficient documentation

## 2014-10-29 DIAGNOSIS — I480 Paroxysmal atrial fibrillation: Secondary | ICD-10-CM

## 2014-10-29 DIAGNOSIS — I481 Persistent atrial fibrillation: Secondary | ICD-10-CM | POA: Diagnosis present

## 2014-10-29 MED ORDER — RAMIPRIL 10 MG PO CAPS: ORAL_CAPSULE | ORAL | Status: DC

## 2014-10-29 NOTE — Patient Instructions (Signed)
Your physician wants you to follow-up in: 1 year with Rudi Coco NP You will receive a reminder letter in the mail two months in advance. If you don't receive a letter, please call our office to schedule the follow-up appointment.

## 2014-10-29 NOTE — Progress Notes (Signed)
Patient ID: [name], male   DOB: 24-Feb-1956, 59 y.o.   MRN: [id number]      PCP:  Willow Ora, MD  Electrophysiologist: Dr. Johney Frame  The patient presents today for  follow up in the afib clinic, last ablation 8/14.  The pt had breakthrough afib and presented to the ER 5/20. He spontaneously converted in the ER. He believes the trigger was indigestion from watermelon.  He took 25 mg carvedilol and a dose of pradaxa both of which had been discontinued at time of ablation. Marland KitchenHe reports no further issues with  afib. Continues to have asymptomatic bradycardia.  Previously had reduce d EF at 25%, thought secondary to  tachycardia mediated cardiomyopathy,  but by echo 12/24 normalized at 50-55%. He has continued on coreg 12.5 mg qd but discontinued in May due to bradycardia.  He has a brady at 50 bpm today, but is asymptomatic, states HR usually low.  Today, he denies symptoms of palpitations, chest pain, shortness of breath, orthopnea, PND, lower extremity edema, dizziness, presyncope, syncope, or neurologic sequela.  The patient feels that he is tolerating medications without difficulties and is otherwise without complaint today.   Past Medical History  Diagnosis Date  . Persistent atrial fibrillation     a. s/p afib ablation 12/17/1 and redo blation 09/2012.  Marland Kitchen Atrial flutter     s/p CTI ablation 2006 by Ladona Ridgel  . Sleep apnea   . Non-ischemic cardiomyopathy     Most recent EF 25% in 2014  . ALLERGIC RHINITIS    Past Surgical History  Procedure Laterality Date  . Atrial ablation surgery  2006    CTI ablation  . Joint replacement      toe joint  . Tonsillectomy    . Atrial fibrillation ablation  02/13/10, 8,28,14  . Tee without cardioversion N/A 10/25/2012    Procedure: TRANSESOPHAGEAL ECHOCARDIOGRAM (TEE);  Surgeon: Lewayne Bunting, MD;  Location: [facility] ENDOSCOPY;  Service: Cardiovascular;  Laterality: N/A;  . Atrial fibrillation ablation N/A 10/26/2012    Procedure: ATRIAL FIBRILLATION  ABLATION;  Surgeon: Hillis Range, MD;  Location: [facility] CATH LAB;  Service: Cardiovascular;  Laterality: N/A;    Current Outpatient Prescriptions  Medication Sig Dispense Refill  . ramipril (ALTACE) 10 MG capsule TAKE 1 CAPSULE (10 MG TOTAL) BY MOUTH DAILY. 90 capsule 6   No current facility-administered medications for this encounter.    Allergies  Allergen Reactions  . Penicillins Other (See Comments)    Syncope (Tolerates amoxicillin)    Social History   Social History  . Marital Status: Married    Spouse Name: N/A  . Number of Children: 1  . 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 Smoking status: Never Smoker   . Smokeless tobacco: Never Used  . Alcohol Use: No  . Drug Use: No  . Sexual Activity: Not on file   Other Topics Concern  . Not on file   Social History Narrative   He is married and lives with his wife in [place].     Family History  Problem Relation Age of Onset  . Lung cancer Father     smoker  . Lung cancer Mother     smoker  . Diabetes Neg Hx   . Prostate cancer Neg Hx   . Colon cancer Neg Hx     ROS-  All systems are reviewed and are negative except as outlined in  the HPI above  Physical Exam: Filed Vitals:   10/29/14 1525  BP: 150/82  Pulse: 50  Height: 5\' 7"  (1.702 m)  Weight: 209 lb 12.8 oz (95.165 kg)    GEN- The patient is well appearing, alert and oriented x 3 today.   Head- normocephalic, atraumatic Eyes-  Sclera clear, conjunctiva pink Ears- hearing intact Oropharynx- clear Neck- supple, no JVP Lymph- no cervical lymphadenopathy Lungs- Clear to ausculation bilaterally, normal work of breathing Heart- Slow,regular rate and rhythm, no murmurs, rubs or gallops, PMI not laterally displaced GI- soft, NT, ND, + BS Extremities- no clubbing, cyanosis, or edema Neuro- strength and sensation are intact  EKG today reveals sinus bradycardia at 50 bpm., diffuse tw changes,  unchanged from previous. Echo is reviewed, LA moderately dilated, EF 50-55% with grade 2 diastolic dysfunction.  Assessment and Plan:  1. Persistent afib Maintaining sinus rhythm since episode of 5/20. Chadsvasc score is 1.  Continue off OAC. Off carvedilol  2. HTN Mildly elevated here. At home runs around 120 systolic.  3. Tachycardia mediated CM Resolved with sinus rhythm  F/u in afib clinc in 1 year at pt's request.  Elvina Sidle. [facility] [address] [place], [place] [address] [phone]

## 2015-01-28 ENCOUNTER — Telehealth: Payer: Self-pay | Admitting: *Deleted

## 2015-01-28 NOTE — Telephone Encounter (Signed)
Voicemail:  "I asked to speak with Dr.Kale's nurse.  I need to talk with nurse about a situation you guys created."

## 2015-02-12 ENCOUNTER — Other Ambulatory Visit (HOSPITAL_COMMUNITY): Payer: Self-pay | Admitting: *Deleted

## 2015-02-12 MED ORDER — CARVEDILOL 25 MG PO TABS: ORAL_TABLET | ORAL | Status: DC

## 2015-03-04 ENCOUNTER — Other Ambulatory Visit (HOSPITAL_COMMUNITY): Payer: Self-pay | Admitting: *Deleted

## 2015-03-04 ENCOUNTER — Ambulatory Visit (HOSPITAL_COMMUNITY)
Admission: RE | Admit: 2015-03-04 | Discharge: 2015-03-04 | Disposition: A | Discharge status: Home / Self Care | Payer: Managed Care, Other (non HMO) | Source: Ambulatory Visit | Attending: Nurse Practitioner | Admitting: Nurse Practitioner

## 2015-03-04 ENCOUNTER — Encounter (HOSPITAL_COMMUNITY): Payer: Self-pay | Admitting: Nurse Practitioner

## 2015-03-04 VITALS — BP 96/78 | HR 145 | Ht 67.0 in | Wt 205.8 lb

## 2015-03-04 DIAGNOSIS — I481 Persistent atrial fibrillation: Secondary | ICD-10-CM | POA: Diagnosis present

## 2015-03-04 DIAGNOSIS — I4819 Other persistent atrial fibrillation: Secondary | ICD-10-CM

## 2015-03-04 LAB — CBC
HCT: 48.5 % (ref 39.0–52.0)
Hemoglobin: 16 g/dL (ref 13.0–17.0)
MCH: 30.2 pg (ref 26.0–34.0)
MCHC: 33 g/dL (ref 30.0–36.0)
MCV: 91.5 fL (ref 78.0–100.0)
Platelets: 191 10*3/uL (ref 150–400)
RBC: 5.3 MIL/uL (ref 4.22–5.81)
RDW: 13.6 % (ref 11.5–15.5)
WBC: 8.4 10*3/uL (ref 4.0–10.5)

## 2015-03-04 LAB — BASIC METABOLIC PANEL
ANION GAP: 9 (ref 5–15)
BUN: 15 mg/dL (ref 6–20)
CO2: 26 mmol/L (ref 22–32)
Calcium: 9.2 mg/dL (ref 8.9–10.3)
Chloride: 108 mmol/L (ref 101–111)
Creatinine, Ser: 1.11 mg/dL (ref 0.61–1.24)
GFR calc Af Amer: >60 mL/min (ref >60)
Glucose, Bld: 100 mg/dL — ABNORMAL HIGH (ref 65–99)
POTASSIUM: 4.2 mmol/L (ref 3.5–5.1)
Sodium: 143 mmol/L (ref 135–145)

## 2015-03-04 LAB — TSH: TSH: 3.201 u[IU]/mL (ref 0.350–4.500)

## 2015-03-04 MED ORDER — RIVAROXABAN 20 MG PO TABS: 20.0000 mg | ORAL_TABLET | Freq: Every day | ORAL | Status: DC

## 2015-03-04 MED ORDER — DABIGATRAN ETEXILATE MESYLATE 150 MG PO CAPS: 150.0000 mg | ORAL_CAPSULE | Freq: Two times a day (BID) | ORAL | Status: DC

## 2015-03-04 NOTE — Patient Instructions (Signed)
Cardioversion scheduled for Friday, January 6th  - Arrive at the [facility] and go to admitting at [facility] not eat or drink anything after midnight the night prior to your procedure.  - Take all your medication with a sip of water prior to arrival.  - You will not be able to drive home after your procedure.

## 2015-03-04 NOTE — Progress Notes (Signed)
Patient ID: [name], male   DOB: 08/25/1955, 60 y.o.   MRN: [id number]     Primary Care Physician: Willow Ora, MD Referring Physician:Dr. Johney Frame   [name] is a 60 y.o. male with a h/o persistent afib, s/p 3 abaltions that is in the afib clinic today with persistent afib x 2 weeks. He had noticed increase in afib burden since November. He has had more stress in his life with his father dying 12/7 and having his mother in law in his house now for failing health. His BP is soft which limits increase in rate control drugs. He has required several cardio versions in the past and has failed most antiarrythmic's. He was off pradaxa for one year after maintaining SR and having low chadsvasc score of 0, but 4 days ago started back on his pradaxa fearing he would require cardioversion.   Today, he denies symptoms of palpitations, chest pain, shortness of breath, orthopnea, PND, lower extremity edema, dizziness, presyncope, syncope, or neurologic sequela. The patient is tolerating medications without difficulties and is otherwise without complaint today.   Past Medical History  Diagnosis Date  . Persistent atrial fibrillation (HCC)     a. s/p afib ablation 12/17/1 and redo blation 09/2012.  Marland Kitchen Atrial flutter [facility])     s/p CTI ablation 2006 by Ladona Ridgel  . Sleep apnea   . Non-ischemic cardiomyopathy (HCC)     Most recent EF 25% in 2014  . ALLERGIC RHINITIS    Past Surgical History  Procedure Laterality Date  . Atrial ablation surgery  2006    CTI ablation  . Joint replacement      toe joint  . Tonsillectomy    . Atrial fibrillation ablation  02/13/10, 8,28,14  . Tee without cardioversion N/A 10/25/2012    Procedure: TRANSESOPHAGEAL ECHOCARDIOGRAM (TEE);  Surgeon: Lewayne Bunting, MD;  Location: [facility] ENDOSCOPY;  Service: Cardiovascular;  Laterality: N/A;  . Atrial fibrillation ablation N/A 10/26/2012    Procedure: ATRIAL FIBRILLATION ABLATION;  Surgeon: Hillis Range, MD;  Location: [facility] CATH  LAB;  Service: Cardiovascular;  Laterality: N/A;    Current Outpatient Prescriptions  Medication Sig Dispense Refill  . carvedilol (COREG) 25 MG tablet Take 1 tablet by mouth daily as needed for afib 90 tablet 1  . ramipril (ALTACE) 10 MG capsule TAKE 1 CAPSULE (10 MG TOTAL) BY MOUTH DAILY. 90 capsule 6  . dabigatran (PRADAXA) 150 MG CAPS capsule Take 1 capsule (150 mg total) by mouth 2 (two) times daily. 60 capsule 3   No current facility-administered medications for this encounter.    Allergies  Allergen Reactions  . Penicillins Other (See Comments)    Syncope (Tolerates amoxicillin)    Social History   Social History  . Marital Status: Married    Spouse Name: N/A  . Number of Children: 1  . 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 Smoking status: Never Smoker   . Smokeless tobacco: Never Used  . Alcohol Use: No  . Drug Use: No  . Sexual Activity: Not on file   Other Topics Concern  . Not on file   Social History Narrative   He is married and lives with his wife in [place].     Family History  Problem Relation Age of Onset  . Lung cancer Father     smoker  . Lung cancer Mother     smoker  .  Diabetes Neg Hx   . Prostate cancer Neg Hx   . Colon cancer Neg Hx     ROS- All systems are reviewed and negative except as per the HPI above  Physical Exam: Filed Vitals:   03/04/15 1512  BP: 96/78  Pulse: 145  Height: 5\' 7"  (1.702 m)  Weight: 205 lb 12.8 oz (93.35 kg)    GEN- The patient is well appearing, alert and oriented x 3 today.   Head- normocephalic, atraumatic Eyes-  Sclera clear, conjunctiva pink Ears- hearing intact Oropharynx- clear Neck- supple, no JVP Lymph- no cervical lymphadenopathy Lungs- Clear to ausculation bilaterally, normal work of breathing Heart- Irregular rate and rhythm, no murmurs, rubs or gallops, PMI not laterally displaced GI- soft, NT, ND, + BS Extremities- no  clubbing, cyanosis, or edema MS- no significant deformity or atrophy Skin- no rash or lesion Psych- euthymic mood, full affect Neuro- strength and sensation are intact  EKG-afib with RVR, v rate of 145 bpm, QRS int 90 ms, qtc 500 ms  Assessment and Plan: 1. Persistent afib with rvr and soft bp Will be very difficult to rate control with low BP Discussed with Dr. Johney Frame and will set up with TEE guided cardioversion, he has already been on Pradaxa for 4 days will have another 4 doses before scheduled cardioversion for Friday. Pt is aware of risk vrs benefit and would like to proceed. He will be sent in new RX for pradaxa and will continue carvedilol.  F/u in afib clinic in one week w/p cardioversion.   Elvina Sidle [facility] [address] [place], [place] [address] [phone]

## 2015-03-06 ENCOUNTER — Ambulatory Visit (HOSPITAL_COMMUNITY)
Admission: RE | Admit: 2015-03-06 | Discharge: 2015-03-06 | Disposition: A | Discharge status: Home / Self Care | Payer: Managed Care, Other (non HMO) | Source: Ambulatory Visit | Attending: Cardiovascular Disease | Admitting: Cardiovascular Disease

## 2015-03-06 ENCOUNTER — Ambulatory Visit (HOSPITAL_COMMUNITY): Payer: Managed Care, Other (non HMO) | Admitting: Certified Registered Nurse Anesthetist

## 2015-03-06 ENCOUNTER — Encounter (HOSPITAL_COMMUNITY): Payer: Self-pay | Admitting: *Deleted

## 2015-03-06 ENCOUNTER — Encounter (HOSPITAL_COMMUNITY)
Admission: RE | Disposition: A | Discharge status: Home / Self Care | Payer: Self-pay | Source: Ambulatory Visit | Attending: Cardiovascular Disease

## 2015-03-06 ENCOUNTER — Ambulatory Visit (HOSPITAL_BASED_OUTPATIENT_CLINIC_OR_DEPARTMENT_OTHER): Payer: Managed Care, Other (non HMO)

## 2015-03-06 DIAGNOSIS — I4891 Unspecified atrial fibrillation: Secondary | ICD-10-CM

## 2015-03-06 DIAGNOSIS — G4730 Sleep apnea, unspecified: Secondary | ICD-10-CM | POA: Insufficient documentation

## 2015-03-06 DIAGNOSIS — I428 Other cardiomyopathies: Secondary | ICD-10-CM | POA: Insufficient documentation

## 2015-03-06 DIAGNOSIS — I481 Persistent atrial fibrillation: Secondary | ICD-10-CM | POA: Diagnosis not present

## 2015-03-06 DIAGNOSIS — Z79899 Other long term (current) drug therapy: Secondary | ICD-10-CM | POA: Insufficient documentation

## 2015-03-06 DIAGNOSIS — Z6831 Body mass index (BMI) 31.0-31.9, adult: Secondary | ICD-10-CM | POA: Insufficient documentation

## 2015-03-06 DIAGNOSIS — I10 Essential (primary) hypertension: Secondary | ICD-10-CM | POA: Diagnosis not present

## 2015-03-06 DIAGNOSIS — Z7901 Long term (current) use of anticoagulants: Secondary | ICD-10-CM | POA: Insufficient documentation

## 2015-03-06 HISTORY — PX: CARDIOVERSION: SHX1299

## 2015-03-06 HISTORY — PX: TEE WITHOUT CARDIOVERSION: SHX5443

## 2015-03-06 SURGERY — ECHOCARDIOGRAM, TRANSESOPHAGEAL
Anesthesia: Monitor Anesthesia Care

## 2015-03-06 MED ORDER — LIDOCAINE HCL (CARDIAC) 20 MG/ML IV SOLN
INTRAVENOUS | Status: AC
  Filled 2015-03-06: qty 5

## 2015-03-06 MED ORDER — MIDAZOLAM HCL 2 MG/2ML IJ SOLN
INTRAMUSCULAR | Status: AC
  Filled 2015-03-06: qty 2

## 2015-03-06 MED ORDER — FENTANYL CITRATE (PF) 250 MCG/5ML IJ SOLN
INTRAMUSCULAR | Status: AC
  Filled 2015-03-06: qty 5

## 2015-03-06 MED ORDER — LIDOCAINE HCL (CARDIAC) 20 MG/ML IV SOLN
INTRAVENOUS | Status: DC | PRN
Start: 2015-03-06 — End: 2015-03-06
  Administered 2015-03-06: 80 mg via INTRAVENOUS

## 2015-03-06 MED ORDER — PROPOFOL 10 MG/ML IV BOLUS
INTRAVENOUS | Status: AC
  Filled 2015-03-06: qty 20

## 2015-03-06 MED ORDER — ONDANSETRON HCL 4 MG/2ML IJ SOLN
INTRAMUSCULAR | Status: AC
  Filled 2015-03-06: qty 2

## 2015-03-06 MED ORDER — PROPOFOL 500 MG/50ML IV EMUL
INTRAVENOUS | Status: DC | PRN
  Administered 2015-03-06: 50 ug/kg/min via INTRAVENOUS

## 2015-03-06 MED ORDER — SODIUM CHLORIDE 0.9 % IV SOLN: INTRAVENOUS | Status: DC

## 2015-03-06 MED ORDER — BUTAMBEN-TETRACAINE-BENZOCAINE 2-2-14 % EX AERO
INHALATION_SPRAY | CUTANEOUS | Status: DC | PRN
  Administered 2015-03-06: 2 via TOPICAL

## 2015-03-06 MED ORDER — SODIUM CHLORIDE 0.9 % IV SOLN
INTRAVENOUS | Status: DC | PRN
  Administered 2015-03-06: 11:00:00 via INTRAVENOUS

## 2015-03-06 NOTE — Anesthesia Postprocedure Evaluation (Signed)
Anesthesia Post Note  Patient: TJAY COOPERWOOD  Procedure(s) Performed: Procedure(s) (LRB): TRANSESOPHAGEAL ECHOCARDIOGRAM (TEE) (N/A) CARDIOVERSION (N/A)  Patient location during evaluation: Endoscopy Anesthesia Type: General Level of consciousness: awake and alert, patient cooperative and oriented Pain management: pain level controlled Vital Signs Assessment: post-procedure vital signs reviewed and stable Respiratory status: spontaneous breathing, nonlabored ventilation and respiratory function stable Cardiovascular status: blood pressure returned to baseline and stable Postop Assessment: no signs of nausea or vomiting Anesthetic complications: no    Last Vitals:  Filed Vitals:   03/06/15 1230 03/06/15 1240  BP: 81/32 102/57  Pulse: 40 43  Temp:    Resp: 15 20    Last Pain: There were no vitals filed for this visit.               Germaine Pomfret

## 2015-03-06 NOTE — Progress Notes (Signed)
Echocardiogram Echocardiogram Transesophageal has been performed.  [name] 03/06/2015, 1:13 PM

## 2015-03-06 NOTE — Discharge Instructions (Signed)
Electrical Cardioversion, Care After °Refer to this sheet in the next few weeks. These instructions provide you with information on caring for yourself after your procedure. Your health care provider may also give you more specific instructions. Your treatment has been planned according to current medical practices, but problems sometimes occur. Call your health care provider if you have any problems or questions after your procedure. °WHAT TO EXPECT AFTER THE PROCEDURE °After your procedure, it is typical to have the following sensations: °· Some redness on the skin where the shocks were delivered. If this is tender, a sunburn lotion or hydrocortisone cream may help. °· Possible return of an abnormal heart rhythm within hours or days after the procedure. °HOME CARE INSTRUCTIONS °· Take medicines only as directed by your health care provider. Be sure you understand how and when to take your medicine. °· Learn how to feel your pulse and check it often. °· Limit your activity for 48 hours after the procedure or as directed by your health care provider. °· Avoid or minimize caffeine and other stimulants as directed by your health care provider. °SEEK MEDICAL CARE IF: °· You feel like your heart is beating too fast or your pulse is not regular. °· You have any questions about your medicines. °· You have bleeding that will not stop. °SEEK IMMEDIATE MEDICAL CARE IF: °· You are dizzy or feel faint. °· It is hard to breathe or you feel short of breath. °· There is a change in discomfort in your chest. °· Your speech is slurred or you have trouble moving an arm or leg on one side of your body. °· You get a serious muscle cramp that does not go away. °· Your fingers or toes turn cold or blue. °  °This information is not intended to replace advice given to you by your health care provider. Make sure you discuss any questions you have with your health care provider. °  °Document Released: 12/05/2012 Document Revised: 03/07/2014  Document Reviewed: 12/05/2012 °Elsevier Interactive Patient Education ©2016 [employer] °Transesophageal Echocardiogram °Transesophageal echocardiography (TEE) is a picture test of your heart using sound waves. The pictures taken can give very detailed pictures of your heart. This can help your doctor see if there are problems with your heart. TEE can check: °· If your heart has blood clots in it. °· How well your heart valves are working. °· If you have an infection on the inside of your heart. °· Some of the major arteries of your heart. °· If your heart valve is working after a repair. °· Your heart before a procedure that uses a shock to your heart to get the rhythm back to normal. °BEFORE THE PROCEDURE °· Do not eat or drink for 6 hours before the procedure or as told by your doctor. °· Make plans to have someone drive you home after the procedure. Do not drive yourself home. °· An IV tube will be put in your arm. °PROCEDURE °· You will be given a medicine to help you relax (sedative). It will be given through the IV tube. °· A numbing medicine will be sprayed or gargled in the back of your throat to help numb it. °· The tip of the probe is placed into the back of your mouth. You will be asked to swallow. This helps to pass the probe into your esophagus. °· Once the tip of the probe is in the right place, your doctor can take pictures of your heart. °· You   You may feel pressure at the back of your throat. AFTER THE PROCEDURE  You will be taken to a recovery area so the sedative can wear off.  Your throat may be sore and scratchy. This will go away slowly over time.  You will go home when you are fully awake and able to swallow liquids.  You should have someone stay with you for the next 24 hours.  Do not drive or operate machinery for the next 24 hours.   This information is not intended to replace advice given to you by your health care provider. Make sure you discuss any questions you have with  your health care provider.   Document Released: 10/15/  2010 Document Revised: 02/19/2013 Document Reviewed: 08/16/2012  Moderate Conscious Sedation, Adult, Care After Refer to this sheet in the next few weeks. These instructions provide you with information on caring for yourself after your procedure. Your health care provider may also give you more specific instructions. Your treatment has been planned according to current medical practices, but problems sometimes occur. Call your health care provider if you have any problems or questions after your procedure. WHAT TO EXPECT AFTER THE PROCEDURE  After your procedure:  You may feel sleepy, clumsy, and have poor balance for several hours.  Vomiting may occur if you eat too soon after the procedure. HOME CARE INSTRUCTIONS  Do not participate in any activities where you could become injured for at least 24 hours. Do not:  Drive.  Swim.  Ride a bicycle.  Operate heavy machinery.  Cook.  Use power tools.  Climb ladders.  Work from a high place.  Do not make important decisions or sign legal documents until you are improved.  If you vomit, drink water, juice, or soup when you can drink without vomiting. Make sure you have little or no nausea before eating solid foods.  Only take over-the-counter or prescription medicines for pain, discomfort, or fever as directed by your health care provider.  Make sure you and your family fully understand everything about the medicines given to you, including what side effects may occur.  You should not drink alcohol, take sleeping pills, or take medicines that cause drowsiness for at least 24 hours.  If you smoke, do not smoke without supervision.  If you are feeling better, you may resume normal activities 24 hours after you were sedated.  Keep all appointments with your health care provider. SEEK MEDICAL CARE IF:  Your skin is pale or bluish in color.  You continue to feel nauseous  or vomit.  Your pain is getting worse and is not helped by medicine.  You have bleeding or swelling.  You are still sleepy or feeling clumsy after 24 hours. SEEK IMMEDIATE MEDICAL CARE IF:  You develop a rash.  You have difficulty breathing.  You develop any type of allergic problem.  You have a fever. MAKE SURE YOU:  Understand these instructions.  Will watch your condition.  Will get help right away if you are not doing well or get worse.   This information is not intended to replace advice given to you by your health care provider. Make sure you discuss any questions you have with your health care provider.   Document Released: 12/05/2012 Document Revised: 03/07/2014 Document Reviewed: 12/05/2012 Elsevier Interactive Patient Education 2016 [employer].  Risk analyst Patient Education [employer]

## 2015-03-06 NOTE — Interval H&P Note (Signed)
History and Physical Interval Note:  03/06/2015 11:25 AM  [name]  has presented today for surgery, with the diagnosis of AFIB  The various methods of treatment have been discussed with the patient and family. After consideration of risks, benefits and other options for treatment, the patient has consented to  Procedure(s): TRANSESOPHAGEAL ECHOCARDIOGRAM (TEE) (N/A) CARDIOVERSION (N/A) as a surgical intervention .  The patient's history has been reviewed, patient examined, no change in status, stable for surgery.  I have reviewed the patient's chart and labs.  Questions were answered to the patient's satisfaction.     Surina Storts

## 2015-03-06 NOTE — H&P (View-Only) (Signed)
Patient ID: [name], male   DOB: 06/12/1955, 60 y.o.   MRN: [id number]     Primary Care Physician: Jose Paz, MD Referring Physician:Dr. Allred   [name] is a 60 y.o. male with a h/o persistent afib, s/p 3 abaltions that is in the afib clinic today with persistent afib x 2 weeks. He had noticed increase in afib burden since November. He has had more stress in his life with his father dying 12/7 and having his mother in law in his house now for failing health. His BP is soft which limits increase in rate control drugs. He has required several cardio versions in the past and has failed most antiarrythmic's. He was off pradaxa for one year after maintaining SR and having low chadsvasc score of 0, but 4 days ago started back on his pradaxa fearing he would require cardioversion.   Today, he denies symptoms of palpitations, chest pain, shortness of breath, orthopnea, PND, lower extremity edema, dizziness, presyncope, syncope, or neurologic sequela. The patient is tolerating medications without difficulties and is otherwise without complaint today.   Past Medical History  Diagnosis Date  . Persistent atrial fibrillation (HCC)     a. s/p afib ablation 12/17/1 and redo blation 09/2012.  . Atrial flutter (HCC)     s/p CTI ablation 2006 by [facility]  . Sleep apnea   . Non-ischemic cardiomyopathy (HCC)     Most recent EF 25% in 2014  . ALLERGIC RHINITIS    Past Surgical History  Procedure Laterality Date  . Atrial ablation surgery  2006    CTI ablation  . Joint replacement      toe joint  . Tonsillectomy    . Atrial fibrillation ablation  02/13/10, 8,28,14  . Tee without cardioversion N/A 10/25/2012    Procedure: TRANSESOPHAGEAL ECHOCARDIOGRAM (TEE);  Surgeon: Brian S Crenshaw, MD;  Location: [facility] ENDOSCOPY;  Service: Cardiovascular;  Laterality: N/A;  . Atrial fibrillation ablation N/A 10/26/2012    Procedure: ATRIAL FIBRILLATION ABLATION;  Surgeon: James Allred, MD;  Location: [facility] CATH  LAB;  Service: Cardiovascular;  Laterality: N/A;    Current Outpatient Prescriptions  Medication Sig Dispense Refill  . carvedilol (COREG) 25 MG tablet Take 1 tablet by mouth daily as needed for afib 90 tablet 1  . ramipril (ALTACE) 10 MG capsule TAKE 1 CAPSULE (10 MG TOTAL) BY MOUTH DAILY. 90 capsule 6  . dabigatran (PRADAXA) 150 MG CAPS capsule Take 1 capsule (150 mg total) by mouth 2 (two) times daily. 60 capsule 3   No current facility-administered medications for this encounter.    Allergies  Allergen Reactions  . Penicillins Other (See Comments)    Syncope (Tolerates amoxicillin)    Social History   Social History  . Marital Status: Married    Spouse Name: N/A  . Number of Children: 1  . 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 Smoking status: Never Smoker   . Smokeless tobacco: Never Used  . Alcohol Use: No  . Drug Use: No  . Sexual Activity: Not on file   Other Topics Concern  . Not on file   Social History Narrative   He is married and lives with his wife in [place].     Family History  Problem Relation Age of Onset  . Lung cancer Father     smoker  . Lung cancer Mother     smoker  .   Diabetes Neg Hx   . Prostate cancer Neg Hx   . Colon cancer Neg Hx     ROS- All systems are reviewed and negative except as per the HPI above  Physical Exam: Filed Vitals:   03/04/15 1512  BP: 96/78  Pulse: 145  Height: 5\' 7"  (1.702 m)  Weight: 205 lb 12.8 oz (93.35 kg)    GEN- The patient is well appearing, alert and oriented x 3 today.   Head- normocephalic, atraumatic Eyes-  Sclera clear, conjunctiva pink Ears- hearing intact Oropharynx- clear Neck- supple, no JVP Lymph- no cervical lymphadenopathy Lungs- Clear to ausculation bilaterally, normal work of breathing Heart- Irregular rate and rhythm, no murmurs, rubs or gallops, PMI not laterally displaced GI- soft, NT, ND, + BS Extremities- no  clubbing, cyanosis, or edema MS- no significant deformity or atrophy Skin- no rash or lesion Psych- euthymic mood, full affect Neuro- strength and sensation are intact  EKG-afib with RVR, v rate of 145 bpm, QRS int 90 ms, qtc 500 ms  Assessment and Plan: 1. Persistent afib with rvr and soft bp Will be very difficult to rate control with low BP Discussed with Dr. Johney Frame and will set up with TEE guided cardioversion, he has already been on Pradaxa for 4 days will have another 4 doses before scheduled cardioversion for Friday. Pt is aware of risk vrs benefit and would like to proceed. He will be sent in new RX for pradaxa and will continue carvedilol.  F/u in afib clinic in one week w/p cardioversion.   Elvina Sidle [facility] [address] [place], [place] [address] [phone]

## 2015-03-06 NOTE — CV Procedure (Signed)
Procedure: Electrical Cardioversion Indications:  Atrial Fibrillation  Procedure Details:  Consent: Risks of procedure as well as the alternatives and risks of each were explained to the (patient/caregiver).  Consent for procedure obtained.  Time Out: Verified patient identification, verified procedure, site/side was marked, verified correct patient position, special equipment/implants available, medications/allergies/relevent history reviewed, required imaging and test results available.  Performed  Patient placed on cardiac monitor, pulse oximetry, supplemental oxygen as necessary.  Sedation given: IV propofol, Dr. Jean Rosenthal, Anesthesiology Pacer pads placed anterior and posterior chest.  Cardioverted 1 time(s).  Cardioversion with synchronized biphasic 120J shock.  Evaluation: Findings: Post procedure EKG shows: marked sinus bradycardia, 44 bpm.  Complications: None Patient did tolerate procedure well.  Time Spent Directly with the Patient:  30 minutes   Quoc Tome 03/06/2015, 11:47 AM

## 2015-03-06 NOTE — Op Note (Signed)
INDICATIONS: precardioversion  PROCEDURE:   Informed consent was obtained prior to the procedure. The risks, benefits and alternatives for the procedure were discussed and the patient comprehended these risks.  Risks include, but are not limited to, cough, sore throat, vomiting, nausea, somnolence, esophageal and stomach trauma or perforation, bleeding, low blood pressure, aspiration, pneumonia, infection, trauma to the teeth and death.    After a procedural time-out, the oropharynx was anesthetized with 20% benzocaine spray. The patient was given IV propofol by Dr. Jean Rosenthal, Anesthesiology for sedation.   The transesophageal probe was inserted in the esophagus and stomach without difficulty and multiple views were obtained.  The patient was kept under observation until the patient left the procedure room.  The patient left the procedure room in stable condition.   Agitated microbubble saline contrast was not administered.  COMPLICATIONS:    There were no immediate complications.  FINDINGS:  NO LA clot. Prominent LA "smoke". LVEF 35%, global hypokinesis  RECOMMENDATIONS:   Proceed with DCCV. Possible tachycardia related cardiomyopathy  Time Spent Directly with the Patient:  30 minutes   Casee Knepp 03/06/2015, 11:45 AM

## 2015-03-06 NOTE — Anesthesia Preprocedure Evaluation (Addendum)
Anesthesia Evaluation  Patient identified by MRN, date of birth, ID band Patient awake    Reviewed: Allergy & Precautions, NPO status , Patient's Chart, lab work & pertinent test results, reviewed documented beta blocker date and time   History of Anesthesia Complications Negative for: history of anesthetic complications  Airway Mallampati: II  TM Distance: >3 FB Neck ROM: Full    Dental  (+) Edentulous Upper, Missing, Poor Dentition, Dental Advisory Given   Pulmonary neg pulmonary ROS,    breath sounds clear to auscultation       Cardiovascular hypertension, Pt. on medications and Pt. on home beta blockers (-) angina+ dysrhythmias Atrial Fibrillation  Rhythm:Irregular Rate:Normal  '14 ECHO: EF 50-55%, valves OK   Neuro/Psych negative neurological ROS     GI/Hepatic negative GI ROS, Neg liver ROS,   Endo/Other  Morbid obesity  Renal/GU negative Renal ROS     Musculoskeletal   Abdominal   Peds  Hematology  (+) Blood dyscrasia (xarelto), ,   Anesthesia Other Findings   Reproductive/Obstetrics                            Anesthesia Physical Anesthesia Plan  ASA: III  Anesthesia Plan: MAC   Post-op Pain Management:    Induction: Intravenous  Airway Management Planned: Nasal Cannula  Additional Equipment:   Intra-op Plan:   Post-operative Plan:   Informed Consent: I have reviewed the patients History and Physical, chart, labs and discussed the procedure including the risks, benefits and alternatives for the proposed anesthesia with the patient or authorized representative who has indicated his/her understanding and acceptance.   Dental advisory given  Plan Discussed with: CRNA and Surgeon  Anesthesia Plan Comments: (Plan routine monitors, MAC)        Anesthesia Quick Evaluation

## 2015-03-06 NOTE — Progress Notes (Signed)
B/p 81/56 and HR Sb in the low 40s after cardioversion. Dr. Royann Shivers notified and patient received NS. B/p up to 102/57 after fluids given. Pt given instructions to return to the ED if any symptoms of lightheadedness or dizziness. Pt verbalized understanding. SmondaYRn

## 2015-03-06 NOTE — Transfer of Care (Signed)
Immediate Anesthesia Transfer of Care Note  Patient: [name]  Procedure(s) Performed: Procedure(s): TRANSESOPHAGEAL ECHOCARDIOGRAM (TEE) (N/A) CARDIOVERSION (N/A)  Patient Location: Endoscopy Unit  Anesthesia Type:MAC  Level of Consciousness: awake, alert  and oriented  Airway & Oxygen Therapy: Patient Spontanous Breathing and Patient connected to nasal cannula oxygen  Post-op Assessment: Report given to RN and Post -op Vital signs reviewed and stable  Post vital signs: Reviewed and stable  Last Vitals:  Filed Vitals:   03/06/15 0958 03/06/15 1200  BP: 111/84 75/45  Pulse: 133   Temp:  36.4 C  Resp: 21     Complications: No apparent anesthesia complications

## 2015-03-09 ENCOUNTER — Encounter (HOSPITAL_COMMUNITY): Payer: Self-pay | Admitting: Cardiovascular Disease

## 2015-03-12 ENCOUNTER — Ambulatory Visit (HOSPITAL_COMMUNITY)
Admission: RE | Admit: 2015-03-12 | Discharge: 2015-03-12 | Disposition: A | Discharge status: Home / Self Care | Payer: Managed Care, Other (non HMO) | Source: Ambulatory Visit | Attending: Nurse Practitioner | Admitting: Nurse Practitioner

## 2015-03-12 VITALS — BP 120/86 | HR 141 | Ht 67.0 in | Wt 209.0 lb

## 2015-03-12 DIAGNOSIS — I481 Persistent atrial fibrillation: Secondary | ICD-10-CM

## 2015-03-12 DIAGNOSIS — I4819 Other persistent atrial fibrillation: Secondary | ICD-10-CM

## 2015-03-12 MED ORDER — CARVEDILOL 25 MG PO TABS: 37.5000 mg | ORAL_TABLET | Freq: Two times a day (BID) | ORAL | Status: DC

## 2015-03-12 MED ORDER — CARVEDILOL 25 MG PO TABS: ORAL_TABLET | ORAL | Status: DC

## 2015-03-13 ENCOUNTER — Encounter (HOSPITAL_COMMUNITY): Payer: Self-pay | Admitting: Nurse Practitioner

## 2015-03-13 ENCOUNTER — Encounter: Payer: Self-pay | Admitting: *Deleted

## 2015-03-13 DIAGNOSIS — Z006 Encounter for examination for normal comparison and control in clinical research program: Secondary | ICD-10-CM

## 2015-03-13 NOTE — Progress Notes (Addendum)
Patient ID: [name], male   DOB: [birth year], 59 y.o.   MRN: [id number]        Primary Care Physician: Willow Ora, MD Referring Physician:Dr. Johney Frame   [name] is a 60 y.o. male with a h/o persistent afib, s/p 3 ablations with  recent persistent afib.   He had noticed increase in afib burden since November. He has had more stress in his life with his father dying 12/7 and having his mother in law in his house, with failing health. His BP was soft which limited increase in rate control drugs. He has required several cardio versions in the past and has used several  antiarrythmic's,  Amiodarone kept in rhythm but it was stopped because he had induced bluish skin color changes, and failure of tikosyn to keep in SR. He was off pradaxa for one year after maintaining SR and having low chadsvasc score of 0, but 4 days ago started back on his pradaxa fearing he would require cardioversion.   He is back in afib clinic, 1/12, after having successful DCCV with TEE. Unfortunately, it only held for 72 hours. EKG shows atrial flutter with variable block at 141 bpm. 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 Will refer to genetic af. He continues on pradaxa.  Today, he denies symptoms of palpitations, chest pain, shortness of breath, orthopnea, PND, lower extremity edema, dizziness, presyncope, syncope, or neurologic sequela. The patient is tolerating medications without difficulties and is otherwise without complaint today.   Past Medical History  Diagnosis Date  . Persistent atrial fibrillation (HCC)     a. s/p afib ablation 12/17/1 and redo blation 09/2012.  Marland Kitchen Atrial flutter [facility])     s/p CTI ablation 2006 by Ladona Ridgel  . Sleep apnea   . Non-ischemic cardiomyopathy (HCC)     Most recent EF 25% in 2014  . ALLERGIC RHINITIS    Past Surgical History  Procedure Laterality Date  . Atrial ablation surgery  2006    CTI ablation  . Joint replacement     toe joint  . Tonsillectomy    . Atrial fibrillation ablation  02/13/10, 8,28,14  . Tee without cardioversion N/A 10/25/2012    Procedure: TRANSESOPHAGEAL ECHOCARDIOGRAM (TEE);  Surgeon: Lewayne Bunting, MD;  Location: [facility] ENDOSCOPY;  Service: Cardiovascular;  Laterality: N/A;  . Atrial fibrillation ablation N/A 10/26/2012    Procedure: ATRIAL FIBRILLATION ABLATION;  Surgeon: Hillis Range, MD;  Location: [facility] CATH LAB;  Service: Cardiovascular;  Laterality: N/A;  . Tee without cardioversion N/A 03/06/2015    Procedure: TRANSESOPHAGEAL ECHOCARDIOGRAM (TEE);  Surgeon: Thurmon Fair, MD;  Location: [facility] ENDOSCOPY;  Service: Cardiovascular;  Laterality: N/A;  . Cardioversion N/A 03/06/2015    Procedure: CARDIOVERSION;  Surgeon: Thurmon Fair, MD;  Location: [facility] ENDOSCOPY;  Service: Cardiovascular;  Laterality: N/A;    Current Outpatient Prescriptions  Medication Sig Dispense Refill  . carvedilol (COREG) 25 MG tablet Take 1.5 tablets (37.5 mg total) by mouth 2 (two) times daily with a meal. 90 tablet 1  . dabigatran (PRADAXA) 150 MG CAPS capsule Take 150 mg by mouth 2 (two) times daily.    . ramipril (ALTACE) 10 MG capsule TAKE 1 CAPSULE (10 MG TOTAL) BY MOUTH DAILY. 90 capsule 6   No current facility-administered medications for this encounter.    Allergies  Allergen Reactions  . Penicillins Other (See Comments)    Syncope (Tolerates amoxicillin) Has patient had a PCN reaction  causing immediate rash, facial/tongue/throat swelling, SOB or lightheadedness with hypotension: Yes Has patient had a PCN reaction causing severe rash involving mucus membranes or skin necrosis: No Has patient had a PCN reaction that required hospitalization No Has patient had a PCN reaction occurring within the last 10 years: No If all of the above answers are "NO", then may proceed with Cephalosporin use.     Social History   Social History  . Marital Status: Married    Spouse Name: N/A  . Number of Children: 1  .  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 Smoking status: Never Smoker   . Smokeless tobacco: Never Used  . Alcohol Use: No  . Drug Use: No  . Sexual Activity: Not on file   Other Topics Concern  . Not on file   Social History Narrative   He is married and lives with his wife in [place].     Family History  Problem Relation Age of Onset  . Lung cancer Father     smoker  . Lung cancer Mother     smoker  . Diabetes Neg Hx   . Prostate cancer Neg Hx   . Colon cancer Neg Hx     ROS- All systems are reviewed and negative except as per the HPI above  Physical Exam: Filed Vitals:   03/12/15 1524  BP: 120/86  Pulse: 141  Height: 5\' 7"  (1.702 m)  Weight: 209 lb (94.802 kg)    GEN- The patient is well appearing, alert and oriented x 3 today.   Head- normocephalic, atraumatic Eyes-  Sclera clear, conjunctiva pink Ears- hearing intact Oropharynx- clear Neck- supple, no JVP Lymph- no cervical lymphadenopathy Lungs- Clear to ausculation bilaterally, normal work of breathing Heart- Irregular rate and rhythm, no murmurs, rubs or gallops, PMI not laterally displaced GI- soft, NT, ND, + BS Extremities- no clubbing, cyanosis, or edema MS- no significant deformity or atrophy Skin- no rash or lesion Psych- euthymic mood, full affect Neuro- strength and sensation are intact  EKG-aflutter  with RVR, v rate of 141 bpm, qrs int 90 ms, qtc 471 ms Epic records reviewed  Assessment and Plan: 1. Persistent afib/flutter with rvr   Options are limited due to already having 3 ablations and failing antiarrythmics. Discussed with Dr. Johney Frame and for now will refer to genetic af clinical trial. Increase carvedilol from 25 mg bid to 37.5 mg bid for better rate control Will set up appointment to see Dr. Johney Frame for further management Continue pradaxa  Afib clinic as needed   Lupita Leash C. [facility] [address] [place], [place] [address] [phone]

## 2015-03-13 NOTE — Progress Notes (Signed)
[name] came in for a GENETIC-AF screening visit. The study was discussed with [name] to include risks/benefits. He was given the consent to read and had an opportunity to ask questions. He agreed to participate in the trial. The consent was signed and he was given a copy. No study related procedures were performed prior to the signing of the consent.  Cherrie Distance, RN Mar 13, 2015 1456  Vital signs taken: BP 106/84 Pulse 65 Height 5'7" Weight 205 lbs

## 2015-03-20 ENCOUNTER — Encounter: Payer: Self-pay | Admitting: *Deleted

## 2015-03-20 ENCOUNTER — Ambulatory Visit: Payer: Managed Care, Other (non HMO) | Admitting: Internal Medicine

## 2015-03-20 DIAGNOSIS — Z006 Encounter for examination for normal comparison and control in clinical research program: Secondary | ICD-10-CM

## 2015-03-20 NOTE — Progress Notes (Signed)
Called [name] to let him know the results the GENETIC-AF trial blood work. He does not have the genotype. He will follow-up with Dr. Johney Frame as scheduled on 1/23.

## 2015-03-23 ENCOUNTER — Encounter: Payer: Self-pay | Admitting: Internal Medicine

## 2015-03-23 ENCOUNTER — Ambulatory Visit (INDEPENDENT_AMBULATORY_CARE_PROVIDER_SITE_OTHER): Payer: Managed Care, Other (non HMO) | Admitting: Internal Medicine

## 2015-03-23 VITALS — BP 124/78 | HR 147 | Ht 67.0 in | Wt 209.0 lb

## 2015-03-23 DIAGNOSIS — I5023 Acute on chronic systolic (congestive) heart failure: Secondary | ICD-10-CM | POA: Diagnosis not present

## 2015-03-23 DIAGNOSIS — I10 Essential (primary) hypertension: Secondary | ICD-10-CM

## 2015-03-23 DIAGNOSIS — I481 Persistent atrial fibrillation: Secondary | ICD-10-CM | POA: Diagnosis not present

## 2015-03-23 DIAGNOSIS — I4819 Other persistent atrial fibrillation: Secondary | ICD-10-CM

## 2015-03-23 LAB — BASIC METABOLIC PANEL
BUN: 20 mg/dL (ref 7–25)
CHLORIDE: 107 mmol/L (ref 98–110)
CO2: 24 mmol/L (ref 20–31)
CREATININE: 1.17 mg/dL (ref 0.70–1.33)
Calcium: 8.8 mg/dL (ref 8.6–10.3)
GLUCOSE: 104 mg/dL — AB (ref 65–99)
POTASSIUM: 4.4 mmol/L (ref 3.5–5.3)
Sodium: 143 mmol/L (ref 135–146)

## 2015-03-23 LAB — CBC WITH DIFFERENTIAL/PLATELET
Basophils Absolute: 0 10*3/uL (ref 0.0–0.1)
Basophils Relative: 0 % (ref 0–1)
EOS PCT: 2 % (ref 0–5)
Eosinophils Absolute: 0.2 10*3/uL (ref 0.0–0.7)
HEMATOCRIT: 47 % (ref 39.0–52.0)
Hemoglobin: 15.6 g/dL (ref 13.0–17.0)
LYMPHS ABS: 1.7 10*3/uL (ref 0.7–4.0)
LYMPHS PCT: 21 % (ref 12–46)
MCH: 30.2 pg (ref 26.0–34.0)
MCHC: 33.2 g/dL (ref 30.0–36.0)
MCV: 91.1 fL (ref 78.0–100.0)
MONO ABS: 0.7 10*3/uL (ref 0.1–1.0)
MONOS PCT: 9 % (ref 3–12)
MPV: 10.3 fL (ref 8.6–12.4)
Neutro Abs: 5.6 10*3/uL (ref 1.7–7.7)
Neutrophils Relative %: 68 % (ref 43–77)
Platelets: 187 10*3/uL (ref 150–400)
RBC: 5.16 MIL/uL (ref 4.22–5.81)
RDW: 13.9 % (ref 11.5–15.5)
WBC: 8.2 10*3/uL (ref 4.0–10.5)

## 2015-03-23 LAB — TSH: TSH: 4.075 u[IU]/mL (ref 0.350–4.500)

## 2015-03-23 LAB — T4, FREE: Free T4: 1.06 ng/dL (ref 0.80–1.80)

## 2015-03-23 LAB — HEPATIC FUNCTION PANEL
ALK PHOS: 43 U/L (ref 40–115)
ALT: 11 U/L (ref 9–46)
AST: 10 U/L (ref 10–35)
Albumin: 3.9 g/dL (ref 3.6–5.1)
BILIRUBIN DIRECT: 0.1 mg/dL (ref <=0.2)
BILIRUBIN INDIRECT: 0.6 mg/dL (ref 0.2–1.2)
TOTAL PROTEIN: 6.1 g/dL (ref 6.1–8.1)
Total Bilirubin: 0.7 mg/dL (ref 0.2–1.2)

## 2015-03-23 MED ORDER — AMIODARONE HCL 200 MG PO TABS: ORAL_TABLET | ORAL | Status: DC

## 2015-03-23 NOTE — Patient Instructions (Signed)
Medication Instructions:  Your physician has recommended you make the following change in your medication: 1) Start Amiodarone 200g---take 2 tablets twice daily for 2 weeks, then decrease to 200mg --take 2 tablets daily for 2 weeks then decrease 200mg  1 tablet daily   Labwork: Your physician recommends that you return for lab work today: BMP/CBC/TSH liver/Free T4   Testing/Procedures: Your physician has requested that you have a cardiac catheterization. Cardiac catheterization is used to diagnose and/or treat various heart conditions. Doctors may recommend this procedure for a number of different reasons. The most common reason is to evaluate chest pain. Chest pain can be a symptom of coronary artery disease (CAD), and cardiac catheterization can show whether plaque is narrowing or blocking your heart's arteries. This procedure is also used to evaluate the valves, as well as measure the blood flow and oxygen levels in different parts of your heart. For further information please visit [email or web address] Please follow instruction sheet, as given.   Please arrive at the [facility] entrance of [facility] on Wed 03/25/15 at 6:30am for heart catheterization.    Do not eat or drink after midnight and HOLD Pradaxa until after procedure.    You have been referred to Dr. Purnell Shoemaker procedure   Follow-Up: Your physician recommends that you schedule a follow-up appointment in: 4 weeks with Dr Johney Frame   Any Other Special Instructions Will Be Listed Below (If Applicable).     If you need a refill on your cardiac medications before your next appointment, please call your pharmacy.

## 2015-03-23 NOTE — Addendum Note (Signed)
Encounter addended by: Newman Nip, NP on: 03/23/2015  9:48 AM<BR>     Documentation filed: Notes Section

## 2015-03-23 NOTE — Progress Notes (Signed)
 PCP:  Alex Plotnikov, MD Primary Cardiology:  Dr Hochrein Primary EP:  Jonika Critz  The patient presents today for urgent electrophysiology follow up. He has returned to persistent atrial arrhythmias (atypical atrial flutter) with tachycardia and reduced EF.  He reports very poor exercise tolerance and SOB with activity.  He is also very fatigued.  He was recently cardioverted but quickly returned to atypical atrial flutter.  He was evaluated for GENETIC AF but did not qualify for the study.  2D echo in 2014 revealed severe LA enlargement with LA size of 62mm.  Today, he denies symptoms of palpitations, chest pain, orthopnea, PND, lower extremity edema, dizziness, presyncope, syncope, or neurologic sequela.  The patient feels that he is tolerating medications without difficulties and is otherwise without complaint today.   Past Medical History  Diagnosis Date  . Persistent atrial fibrillation (HCC)     a. s/p afib ablation 12/17/1 and redo blation 09/2012.  . Atrial flutter (HCC)     s/p CTI ablation 2006 by [facility]  . Sleep apnea   . Non-ischemic cardiomyopathy (HCC)     Most recent EF 25% in 2014  . ALLERGIC RHINITIS    Past Surgical History  Procedure Laterality Date  . Atrial ablation surgery  2006    CTI ablation  . Joint replacement      toe joint  . Tonsillectomy    . Atrial fibrillation ablation  02/13/10, 8,28,14  . Tee without cardioversion N/A 10/25/2012    Procedure: TRANSESOPHAGEAL ECHOCARDIOGRAM (TEE);  Surgeon: Brian S Crenshaw, MD;  Location: [facility] ENDOSCOPY;  Service: Cardiovascular;  Laterality: N/A;  . Atrial fibrillation ablation N/A 10/26/2012    Procedure: ATRIAL FIBRILLATION ABLATION;  Surgeon: Heiley Shaikh, MD;  Location: [facility] CATH LAB;  Service: Cardiovascular;  Laterality: N/A;  . Tee without cardioversion N/A 03/06/2015    Procedure: TRANSESOPHAGEAL ECHOCARDIOGRAM (TEE);  Surgeon: Mihai Croitoru, MD;  Location: [facility] ENDOSCOPY;  Service: Cardiovascular;  Laterality: N/A;    . Cardioversion N/A 03/06/2015    Procedure: CARDIOVERSION;  Surgeon: Mihai Croitoru, MD;  Location: [facility] ENDOSCOPY;  Service: Cardiovascular;  Laterality: N/A;    Current Outpatient Prescriptions  Medication Sig Dispense Refill  . carvedilol (COREG) 25 MG tablet Take 1.5 tablets (37.5 mg total) by mouth 2 (two) times daily with a meal. 90 tablet 1  . dabigatran (PRADAXA) 150 MG CAPS capsule Take 150 mg by mouth 2 (two) times daily.    . ramipril (ALTACE) 10 MG capsule TAKE 1 CAPSULE (10 MG TOTAL) BY MOUTH DAILY. 90 capsule 6   No current facility-administered medications for this visit.    Allergies  Allergen Reactions  . Penicillins Other (See Comments)    Syncope (Tolerates amoxicillin) Has patient had a PCN reaction causing immediate rash, facial/tongue/throat swelling, SOB or lightheadedness with hypotension: Yes Has patient had a PCN reaction causing severe rash involving mucus membranes or skin necrosis: No Has patient had a PCN reaction that required hospitalization No Has patient had a PCN reaction occurring within the last 10 years: No If all of the above answers are "NO", then may proceed with Cephalosporin use.     Social History   Social History  . Marital Status: Married    Spouse Name: N/A  . Number of Children: 1  . 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 Smoking status: Never Smoker   . Smokeless tobacco: Never Used  . Alcohol Use: No  .   Drug Use: No  . Sexual Activity: Not on file   Other Topics Concern  . Not on file   Social History Narrative   He is married and lives with his wife in [place].     Family History  Problem Relation Age of Onset  . Lung cancer Father     smoker  . Lung cancer Mother     smoker  . Diabetes Neg Hx   . Prostate cancer Neg Hx   . Colon cancer Neg Hx     ROS-  All systems are reviewed and are negative except as outlined in the HPI above  Physical  Exam: Filed Vitals:   03/23/15 0830  BP: 124/78  Pulse: 147  Height: 5\' 7"  (1.702 m)  Weight: 209 lb (94.802 kg)    GEN- The patient is well appearing, alert and oriented x 3 today.   Head- normocephalic, atraumatic Eyes-  Sclera clear, conjunctiva pink Ears- hearing intact Oropharynx- clear Neck- supple, no JVP Lymph- no cervical lymphadenopathy Lungs- Clear to ausculation bilaterally, normal work of breathing Heart- tachycardic irregular rhythm GI- soft, NT, ND, + BS Extremities- no clubbing, cyanosis, or edema Neuro- strength and sensation are intact  EKG today reveals atypical atrial flutter with V rate 147 bpm TEE reviewed which reveals biatrial dilation with reduced EF and no significant valvular disease  Assessment and Plan:  1. Persistent afib Now back in afib/ atypical atrial flutter Given prior severe LA enlargement, I doubt that we will be very successful in maintaining sinus without MAZE. Rate control is difficult (he is on high dose beta blocker) and is the likely cause for his reduced EF Continue pradaxa Start amiodarone 400mg  BID x 14 days then 400mg  daily then 200mg  daily.   Risks, benefits, and alternatives to amiodarone were discussed at length with the patient.  He understands risks and wishes to proceed.  He is not excited about long term amiodarone however and would like to be referred to Dr Cornelius Moras for consideration of MAZE.  I think that this would be a very good option if he is a candidate. IF he does not spontaneously convert to sinus then will cardiovert upon return.  2. Acute systolic dysfunction EF acutely depressed, likely tachy mediated.  Previously normalized with sinus.  I worry that our ability to maintain sinus long term is low. Given SOB and decreased exercise tolerance, will proceed with cath.  If he has coronary disease, I would favor CABG and MAZE consideration prior to percutaneous interventions. I have spoken with Dr Eldridge Dace who advised that  we hold pradaxa for 48 hours prior to cath.  Will therefore schedule cath for Wed. Risks, benefits, and alternatives to cath discussed at length with patient who wishes to proceed. Will need to add ace inhibitor post cath  3. HTN Stable No change required today  Follow-up with me in 4 weeks for further discussion. Given refractory atrial arrhythmias and acutely reduced EF, this is a very complex patient.  A high level of decision making was required for the visit today.  Hillis Range MD, [facility] 03/23/2015 9:32 AM

## 2015-03-25 ENCOUNTER — Encounter (HOSPITAL_COMMUNITY): Payer: Self-pay | Admitting: Cardiovascular Disease

## 2015-03-25 ENCOUNTER — Encounter (HOSPITAL_COMMUNITY)
Admission: RE | Disposition: A | Discharge status: Home / Self Care | Payer: Managed Care, Other (non HMO) | Source: Ambulatory Visit | Attending: Cardiovascular Disease

## 2015-03-25 ENCOUNTER — Ambulatory Visit (HOSPITAL_COMMUNITY)
Admission: RE | Admit: 2015-03-25 | Discharge: 2015-03-25 | Disposition: A | Discharge status: Home / Self Care | Payer: Managed Care, Other (non HMO) | Source: Ambulatory Visit | Attending: Cardiovascular Disease | Admitting: Cardiovascular Disease

## 2015-03-25 DIAGNOSIS — I484 Atypical atrial flutter: Secondary | ICD-10-CM | POA: Diagnosis not present

## 2015-03-25 DIAGNOSIS — I10 Essential (primary) hypertension: Secondary | ICD-10-CM | POA: Insufficient documentation

## 2015-03-25 DIAGNOSIS — J309 Allergic rhinitis, unspecified: Secondary | ICD-10-CM | POA: Insufficient documentation

## 2015-03-25 DIAGNOSIS — I4819 Other persistent atrial fibrillation: Secondary | ICD-10-CM | POA: Insufficient documentation

## 2015-03-25 DIAGNOSIS — I2510 Atherosclerotic heart disease of native coronary artery without angina pectoris: Secondary | ICD-10-CM

## 2015-03-25 DIAGNOSIS — Z7901 Long term (current) use of anticoagulants: Secondary | ICD-10-CM | POA: Diagnosis not present

## 2015-03-25 DIAGNOSIS — I420 Dilated cardiomyopathy: Secondary | ICD-10-CM | POA: Insufficient documentation

## 2015-03-25 DIAGNOSIS — G4730 Sleep apnea, unspecified: Secondary | ICD-10-CM | POA: Diagnosis not present

## 2015-03-25 DIAGNOSIS — I481 Persistent atrial fibrillation: Secondary | ICD-10-CM | POA: Insufficient documentation

## 2015-03-25 DIAGNOSIS — Z880 Allergy status to penicillin: Secondary | ICD-10-CM | POA: Insufficient documentation

## 2015-03-25 HISTORY — DX: Atherosclerotic heart disease of native coronary artery without angina pectoris: I25.10

## 2015-03-25 HISTORY — PX: CARDIAC CATHETERIZATION: SHX172

## 2015-03-25 SURGERY — LEFT HEART CATH AND CORONARY ANGIOGRAPHY
Anesthesia: LOCAL

## 2015-03-25 MED ORDER — HEPARIN SODIUM (PORCINE) 1000 UNIT/ML IJ SOLN
INTRAMUSCULAR | Status: AC
  Filled 2015-03-25: qty 1

## 2015-03-25 MED ORDER — FENTANYL CITRATE (PF) 100 MCG/2ML IJ SOLN
INTRAMUSCULAR | Status: DC | PRN
  Administered 2015-03-25: 25 ug via INTRAVENOUS

## 2015-03-25 MED ORDER — NITROGLYCERIN 1 MG/10 ML FOR IR/CATH LAB
INTRA_ARTERIAL | Status: AC
  Filled 2015-03-25: qty 10

## 2015-03-25 MED ORDER — VERAPAMIL HCL 2.5 MG/ML IV SOLN
INTRA_ARTERIAL | Status: DC | PRN
  Administered 2015-03-25: 20 mL via INTRA_ARTERIAL

## 2015-03-25 MED ORDER — ACETAMINOPHEN 325 MG PO TABS: 650.0000 mg | ORAL_TABLET | ORAL | Status: DC | PRN

## 2015-03-25 MED ORDER — SODIUM CHLORIDE 0.9% FLUSH: 3.0000 mL | Freq: Two times a day (BID) | INTRAVENOUS | Status: DC

## 2015-03-25 MED ORDER — SODIUM CHLORIDE 0.9 % IV SOLN: 250.0000 mL | INTRAVENOUS | Status: DC | PRN

## 2015-03-25 MED ORDER — LIDOCAINE HCL (PF) 1 % IJ SOLN
INTRAMUSCULAR | Status: DC | PRN
Start: 2015-03-25 — End: 2015-03-25
  Administered 2015-03-25: 2 mL via INTRADERMAL

## 2015-03-25 MED ORDER — FENTANYL CITRATE (PF) 100 MCG/2ML IJ SOLN
INTRAMUSCULAR | Status: AC
  Filled 2015-03-25: qty 2

## 2015-03-25 MED ORDER — SODIUM CHLORIDE 0.9 % WEIGHT BASED INFUSION: 1.0000 mL/kg/h | INTRAVENOUS | Status: DC

## 2015-03-25 MED ORDER — MIDAZOLAM HCL 2 MG/2ML IJ SOLN
INTRAMUSCULAR | Status: DC | PRN
  Administered 2015-03-25: 2 mg via INTRAVENOUS

## 2015-03-25 MED ORDER — HEPARIN (PORCINE) IN NACL 2-0.9 UNIT/ML-% IJ SOLN
INTRAMUSCULAR | Status: AC
  Filled 2015-03-25: qty 1500

## 2015-03-25 MED ORDER — MIDAZOLAM HCL 2 MG/2ML IJ SOLN
INTRAMUSCULAR | Status: AC
  Filled 2015-03-25: qty 2

## 2015-03-25 MED ORDER — LIDOCAINE HCL (PF) 1 % IJ SOLN
INTRAMUSCULAR | Status: AC
  Filled 2015-03-25: qty 30

## 2015-03-25 MED ORDER — ONDANSETRON HCL 4 MG/2ML IJ SOLN: 4.0000 mg | Freq: Four times a day (QID) | INTRAMUSCULAR | Status: DC | PRN

## 2015-03-25 MED ORDER — SODIUM CHLORIDE 0.9% FLUSH: 3.0000 mL | INTRAVENOUS | Status: DC | PRN

## 2015-03-25 MED ORDER — IOHEXOL 350 MG/ML SOLN
INTRAVENOUS | Status: DC | PRN
  Administered 2015-03-25: 100 mL via INTRA_ARTERIAL

## 2015-03-25 MED ORDER — HEPARIN SODIUM (PORCINE) 1000 UNIT/ML IJ SOLN
INTRAMUSCULAR | Status: DC | PRN
  Administered 2015-03-25: 5000 [IU] via INTRAVENOUS

## 2015-03-25 MED ORDER — ASPIRIN 81 MG PO CHEW
81.0000 mg | CHEWABLE_TABLET | ORAL | Status: AC
  Administered 2015-03-25: 81 mg via ORAL

## 2015-03-25 MED ORDER — SODIUM CHLORIDE 0.9 % WEIGHT BASED INFUSION
3.0000 mL/kg/h | INTRAVENOUS | Status: AC
  Administered 2015-03-25: 3 mL/kg/h via INTRAVENOUS

## 2015-03-25 MED ORDER — ASPIRIN 81 MG PO CHEW
CHEWABLE_TABLET | ORAL | Status: AC
  Filled 2015-03-25: qty 1

## 2015-03-25 SURGICAL SUPPLY — 11 items
CATH INFINITI 5 FR JL3.5 (CATHETERS) ×1 IMPLANT
CATH INFINITI 5FR ANG PIGTAIL (CATHETERS) ×2 IMPLANT
CATH OPTITORQUE TIG 4.0 5F (CATHETERS) ×2 IMPLANT
DEVICE RAD COMP TR BAND LRG (VASCULAR PRODUCTS) ×2 IMPLANT
GLIDESHEATH SLEND A-KIT 6F 22G (SHEATH) ×2 IMPLANT
KIT HEART LEFT (KITS) ×2 IMPLANT
PACK CARDIAC CATHETERIZATION (CUSTOM PROCEDURE TRAY) ×2 IMPLANT
SYR MEDRAD MARK V 150ML (SYRINGE) ×2 IMPLANT
TRANSDUCER W/STOPCOCK (MISCELLANEOUS) ×2 IMPLANT
TUBING CIL FLEX 10 FLL-RA (TUBING) ×2 IMPLANT
WIRE SAFE-T 1.5MM-J .035X260CM (WIRE) ×2 IMPLANT

## 2015-03-25 NOTE — Interval H&P Note (Signed)
Cath Lab Visit (complete for each Cath Lab visit)  Clinical Evaluation Leading to the Procedure:   ACS: No.  Non-ACS:    Anginal Classification: CCS II  Anti-ischemic medical therapy: Minimal Therapy (1 class of medications)  Non-Invasive Test Results: No non-invasive testing performed  Prior CABG: No previous CABG      History and Physical Interval Note:  03/25/2015 8:22 AM  [name]  has presented today for surgery, with the diagnosis of c/p  The various methods of treatment have been discussed with the patient and family. After consideration of risks, benefits and other options for treatment, the patient has consented to  Procedure(s): Left Heart Cath and Coronary Angiography (N/A) as a surgical intervention .  The patient's history has been reviewed, patient examined, no change in status, stable for surgery.  I have reviewed the patient's chart and labs.  Questions were answered to the patient's satisfaction.     Al Bracewell,War[facility] A

## 2015-03-25 NOTE — Discharge Instructions (Signed)
Radial Site Care °Refer to this sheet in the next few weeks. These instructions provide you with information about caring for yourself after your procedure. Your health care provider may also give you more specific instructions. Your treatment has been planned according to current medical practices, but problems sometimes occur. Call your health care provider if you have any problems or questions after your procedure. °WHAT TO EXPECT AFTER THE PROCEDURE °After your procedure, it is typical to have the following: °· Bruising at the radial site that usually fades within 1-2 weeks. °· Blood collecting in the tissue (hematoma) that may be painful to the touch. It should usually decrease in size and tenderness within 1-2 weeks. °HOME CARE INSTRUCTIONS °· Take medicines only as directed by your health care provider. °· You may shower 24-48 hours after the procedure or as directed by your health care provider. Remove the bandage (dressing) and gently wash the site with plain soap and water. Pat the area dry with a clean towel. Do not rub the site, because this may cause bleeding. °· Do not take baths, swim, or use a hot tub until your health care provider approves. °· Check your insertion site every day for redness, swelling, or drainage. °· Do not apply powder or lotion to the site. °· Do not flex or bend the affected arm for 24 hours or as directed by your health care provider. °· Do not push or pull heavy objects with the affected arm for 24 hours or as directed by your health care provider. °· Do not lift over 10 lb (4.5 kg) for 5 days after your procedure or as directed by your health care provider. °· Ask your health care provider when it is okay to: °¨ Return to work or school. °¨ Resume usual physical activities or sports. °¨ Resume sexual activity. °· Do not drive home if you are discharged the same day as the procedure. Have someone else drive you. °· You may drive 24 hours after the procedure unless otherwise  instructed by your health care provider. °· Do not operate machinery or power tools for 24 hours after the procedure. °· If your procedure was done as an outpatient procedure, which means that you went home the same day as your procedure, a responsible adult should be with you for the first 24 hours after you arrive home. °· Keep all follow-up visits as directed by your health care provider. This is important. °SEEK MEDICAL CARE IF: °· You have a fever. °· You have chills. °· You have increased bleeding from the radial site. Hold pressure on the site. °SEEK IMMEDIATE MEDICAL CARE IF: °· You have unusual pain at the radial site. °· You have redness, warmth, or swelling at the radial site. °· You have drainage (other than a small amount of blood on the dressing) from the radial site. °· The radial site is bleeding, and the bleeding does not stop after 30 minutes of holding steady pressure on the site. °· Your arm or hand becomes pale, cool, tingly, or numb. °  °This information is not intended to replace advice given to you by your health care provider. Make sure you discuss any questions you have with your health care provider. °  °Document Released: 03/19/2010 Document Revised: 03/07/2014 Document Reviewed: 09/02/2013 °Elsevier Interactive Patient Education ©2016 [employer] ° °

## 2015-03-25 NOTE — H&P (View-Only) (Signed)
PCP:  Sonda Primes, MD Primary Cardiology:  Dr Antoine Poche Primary EP:  Even Budlong  The patient presents today for urgent electrophysiology follow up. He has returned to persistent atrial arrhythmias (atypical atrial flutter) with tachycardia and reduced EF.  He reports very poor exercise tolerance and SOB with activity.  He is also very fatigued.  He was recently cardioverted but quickly returned to atypical atrial flutter.  He was evaluated for GENETIC AF but did not qualify for the study.  2D echo in 2014 revealed severe LA enlargement with LA size of 62mm.  Today, he denies symptoms of palpitations, chest pain, orthopnea, PND, lower extremity edema, dizziness, presyncope, syncope, or neurologic sequela.  The patient feels that he is tolerating medications without difficulties and is otherwise without complaint today.   Past Medical History  Diagnosis Date  . Persistent atrial fibrillation (HCC)     a. s/p afib ablation 12/17/1 and redo blation 09/2012.  Marland Kitchen Atrial flutter [facility])     s/p CTI ablation 2006 by Ladona Ridgel  . Sleep apnea   . Non-ischemic cardiomyopathy (HCC)     Most recent EF 25% in 2014  . ALLERGIC RHINITIS    Past Surgical History  Procedure Laterality Date  . Atrial ablation surgery  2006    CTI ablation  . Joint replacement      toe joint  . Tonsillectomy    . Atrial fibrillation ablation  02/13/10, 8,28,14  . Tee without cardioversion N/A 10/25/2012    Procedure: TRANSESOPHAGEAL ECHOCARDIOGRAM (TEE);  Surgeon: Lewayne Bunting, MD;  Location: [facility] ENDOSCOPY;  Service: Cardiovascular;  Laterality: N/A;  . Atrial fibrillation ablation N/A 10/26/2012    Procedure: ATRIAL FIBRILLATION ABLATION;  Surgeon: Hillis Range, MD;  Location: [facility] CATH LAB;  Service: Cardiovascular;  Laterality: N/A;  . Tee without cardioversion N/A 03/06/2015    Procedure: TRANSESOPHAGEAL ECHOCARDIOGRAM (TEE);  Surgeon: Thurmon Fair, MD;  Location: [facility] ENDOSCOPY;  Service: Cardiovascular;  Laterality: N/A;    . Cardioversion N/A 03/06/2015    Procedure: CARDIOVERSION;  Surgeon: Thurmon Fair, MD;  Location: [facility] ENDOSCOPY;  Service: Cardiovascular;  Laterality: N/A;    Current Outpatient Prescriptions  Medication Sig Dispense Refill  . carvedilol (COREG) 25 MG tablet Take 1.5 tablets (37.5 mg total) by mouth 2 (two) times daily with a meal. 90 tablet 1  . dabigatran (PRADAXA) 150 MG CAPS capsule Take 150 mg by mouth 2 (two) times daily.    . ramipril (ALTACE) 10 MG capsule TAKE 1 CAPSULE (10 MG TOTAL) BY MOUTH DAILY. 90 capsule 6   No current facility-administered medications for this visit.    Allergies  Allergen Reactions  . Penicillins Other (See Comments)    Syncope (Tolerates amoxicillin) Has patient had a PCN reaction causing immediate rash, facial/tongue/throat swelling, SOB or lightheadedness with hypotension: Yes Has patient had a PCN reaction causing severe rash involving mucus membranes or skin necrosis: No Has patient had a PCN reaction that required hospitalization No Has patient had a PCN reaction occurring within the last 10 years: No If all of the above answers are "NO", then may proceed with Cephalosporin use.     Social History   Social History  . Marital Status: Married    Spouse Name: N/A  . Number of Children: 1  . 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 Smoking status: Never Smoker   . Smokeless tobacco: Never Used  . Alcohol Use: No  .  Drug Use: No  . Sexual Activity: Not on file   Other Topics Concern  . Not on file   Social History Narrative   He is married and lives with his wife in [place].     Family History  Problem Relation Age of Onset  . Lung cancer Father     smoker  . Lung cancer Mother     smoker  . Diabetes Neg Hx   . Prostate cancer Neg Hx   . Colon cancer Neg Hx     ROS-  All systems are reviewed and are negative except as outlined in the HPI above  Physical  Exam: Filed Vitals:   03/23/15 0830  BP: 124/78  Pulse: 147  Height: 5\' 7"  (1.702 m)  Weight: 209 lb (94.802 kg)    GEN- The patient is well appearing, alert and oriented x 3 today.   Head- normocephalic, atraumatic Eyes-  Sclera clear, conjunctiva pink Ears- hearing intact Oropharynx- clear Neck- supple, no JVP Lymph- no cervical lymphadenopathy Lungs- Clear to ausculation bilaterally, normal work of breathing Heart- tachycardic irregular rhythm GI- soft, NT, ND, + BS Extremities- no clubbing, cyanosis, or edema Neuro- strength and sensation are intact  EKG today reveals atypical atrial flutter with V rate 147 bpm TEE reviewed which reveals biatrial dilation with reduced EF and no significant valvular disease  Assessment and Plan:  1. Persistent afib Now back in afib/ atypical atrial flutter Given prior severe LA enlargement, I doubt that we will be very successful in maintaining sinus without MAZE. Rate control is difficult (he is on high dose beta blocker) and is the likely cause for his reduced EF Continue pradaxa Start amiodarone 400mg  BID x 14 days then 400mg  daily then 200mg  daily.   Risks, benefits, and alternatives to amiodarone were discussed at length with the patient.  He understands risks and wishes to proceed.  He is not excited about long term amiodarone however and would like to be referred to Dr Cornelius Moras for consideration of MAZE.  I think that this would be a very good option if he is a candidate. IF he does not spontaneously convert to sinus then will cardiovert upon return.  2. Acute systolic dysfunction EF acutely depressed, likely tachy mediated.  Previously normalized with sinus.  I worry that our ability to maintain sinus long term is low. Given SOB and decreased exercise tolerance, will proceed with cath.  If he has coronary disease, I would favor CABG and MAZE consideration prior to percutaneous interventions. I have spoken with Dr Eldridge Dace who advised that  we hold pradaxa for 48 hours prior to cath.  Will therefore schedule cath for Wed. Risks, benefits, and alternatives to cath discussed at length with patient who wishes to proceed. Will need to add ace inhibitor post cath  3. HTN Stable No change required today  Follow-up with me in 4 weeks for further discussion. Given refractory atrial arrhythmias and acutely reduced EF, this is a very complex patient.  A high level of decision making was required for the visit today.  Hillis Range MD, [facility] 03/23/2015 9:32 AM

## 2015-03-26 MED FILL — Heparin Sodium (Porcine) 2 Unit/ML in Sodium Chloride 0.9%: INTRAMUSCULAR | Qty: 1000 | Status: AC

## 2015-03-27 ENCOUNTER — Institutional Professional Consult (permissible substitution) (INDEPENDENT_AMBULATORY_CARE_PROVIDER_SITE_OTHER): Payer: Managed Care, Other (non HMO) | Admitting: Thoracic Surgery (Cardiothoracic Vascular Surgery)

## 2015-03-27 ENCOUNTER — Encounter: Payer: Self-pay | Admitting: Thoracic Surgery (Cardiothoracic Vascular Surgery)

## 2015-03-27 VITALS — BP 122/64 | HR 46 | Resp 16 | Ht 67.0 in | Wt 208.0 lb

## 2015-03-27 DIAGNOSIS — E669 Obesity, unspecified: Secondary | ICD-10-CM | POA: Insufficient documentation

## 2015-03-27 DIAGNOSIS — I5022 Chronic systolic (congestive) heart failure: Secondary | ICD-10-CM | POA: Diagnosis not present

## 2015-03-27 DIAGNOSIS — I481 Persistent atrial fibrillation: Secondary | ICD-10-CM

## 2015-03-27 DIAGNOSIS — I4819 Other persistent atrial fibrillation: Secondary | ICD-10-CM

## 2015-03-27 NOTE — Progress Notes (Signed)
[address]       [name] [address]             [phone]     CARDIOTHORACIC SURGERY CONSULTATION REPORT  Referring Provider is Hillis Range, MD PCP is Sonda Primes, MD  Chief Complaint  Patient presents with  . Atrial Fibrillation    eval for MAZE...CATH 03/25/15, TEE 03/06/15    HPI:  Patient is a 60 year old moderately obese male with history of rheumatic fever during childhood, nonischemic cardiomyopathy, chronic systolic congestive heart failure, chronic persistent atrial fibrillation that has failed medical therapy with numerous agents and catheter-based atrial fibrillation ablation on 2 previous occasions, and OSA who has been referred for possible maze procedure.  The patient states that he was first diagnosed with atrial fibrillation in April 2003. He initially did well on medical therapy but he developed atrial flutter and underwent catheter-based ablation by Dr. Ladona Ridgel in 2006.  He later developed persistent atrial fibrillation which has failed medical therapy with both amiodarone and Tikosyn.  He states that he did well on amiodarone for a prolonged period of time, but he eventually developed blue-gray skin discoloration.  He is undergone cardioversion on numerous occasions and catheter based ablation for atrial fibrillation on 2 previous occasions including 2011 and again in 2014. Following each ablation the patient initially maintained sinus rhythm for a period of time.  After his most recent ablation in 2014 he did well until May 2016 when he had a brief recurrence of atrial fibrillation.  Beginning in November 2016 the patient has had increasing episodes of paroxysmal atrial fibrillation, and by the end of December he was staying in persistent atrial fibrillation.  He was seen recently in follow-up by Dr. Johney Frame. Transesophageal echocardiogram was performed and notable for the absence of signs of left atrial thrombus.  Left ventricular ejection  fraction was estimated 35-40% which was somewhat increased in comparison with his last previous transthoracic echocardiogram performed in 2014. The patient has moderate left and right atrial enlargement but does not have any significant valvular disease.  Diagnostic cardiac catheterization was performed by Dr. Tresa Endo and notable for the presence of mild nonobstructive coronary artery disease.  The patient was referred for surgical consultation to discuss possible maze procedure.  Patient is married and lives locally in [place] with his wife and mother-in-law. He works full-time for [employer].  The patient states that when he was younger he exercised routinely and was quite fit. He has experienced exertional fatigue and shortness of breath ever since he first developed atrial fibrillation in 2003. As he has become less physically active yet gained some weight including tended 12 pounds over the past year. He has been dealing with atrial fibrillation for quite some time and can tell when he is out of rhythm. For the past several weeks he has had exertional shortness of breath. Approximately 2 weeks ago his resting pulse intermittently dropped to below 40, causing him increased shortness of breath and frequent dizzy spells without syncope. His pulse rate has improved over the last week or 2. He gets short of breath with more strenuous activity and gets tired in the afternoons. He denies any resting shortness of breath, PND, orthopnea, or lower extremity edema. He has never had any chest pain or chest tightness either with activity or at rest. He otherwise reports no other significant physical problems. He enjoys bowling on a regular basis and participates in national bowling tournaments.  The patient has been chronically anticoagulated off and on through the years, most recently using Pradaxa.  Pradaxa is currently on hold for his cardiac catheterization.   Past Medical History  Diagnosis Date    . Persistent atrial fibrillation (HCC)     a. s/p afib ablation 02/13/10 and redo ablation 10/26/12  . Atrial flutter [facility])     s/p CTI ablation 2006 by Ladona Ridgel  . Sleep apnea   . Non-ischemic cardiomyopathy (HCC)   . ALLERGIC RHINITIS   . Rheumatic fever     childhood  . Chronic systolic (congestive) heart failure (HCC)   . Essential hypertension   . Obesity     Past Surgical History  Procedure Laterality Date  . Atrial ablation surgery  2006    CTI ablation  . Joint replacement Left     toe joint  . Tonsillectomy    . Atrial fibrillation ablation  02/13/10  . Tee without cardioversion N/A 10/25/2012    Procedure: TRANSESOPHAGEAL ECHOCARDIOGRAM (TEE);  Surgeon: Lewayne Bunting, MD;  Location: [facility] ENDOSCOPY;  Service: Cardiovascular;  Laterality: N/A;  . Atrial fibrillation ablation N/A 10/26/2012    Procedure: ATRIAL FIBRILLATION ABLATION;  Surgeon: Hillis Range, MD;  Location: [facility] CATH LAB;  Service: Cardiovascular;  Laterality: N/A;  . Tee without cardioversion N/A 03/06/2015    Procedure: TRANSESOPHAGEAL ECHOCARDIOGRAM (TEE);  Surgeon: Thurmon Fair, MD;  Location: [facility] ENDOSCOPY;  Service: Cardiovascular;  Laterality: N/A;  . Cardioversion N/A 03/06/2015    Procedure: CARDIOVERSION;  Surgeon: Thurmon Fair, MD;  Location: [facility] ENDOSCOPY;  Service: Cardiovascular;  Laterality: N/A;  . Cardiac catheterization N/A 03/25/2015    Procedure: Left Heart Cath and Coronary Angiography;  Surgeon: Lennette Bihari, MD;  Location: [facility] INVASIVE CV LAB;  Service: Cardiovascular;  Laterality: N/A;    Family History  Problem Relation Age of Onset  . Lung cancer Father     smoker  . Lung cancer Mother     smoker  . Diabetes Neg Hx   . Prostate cancer Neg Hx   . Colon cancer Neg Hx     Social History   Social History  . Marital Status: Married    Spouse Name: N/A  . Number of Children: 1  . 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  Smoking status: Never Smoker   . Smokeless tobacco: Never Used  . Alcohol Use: No  . Drug Use: No  . Sexual Activity: Not on file   Other Topics Concern  . Not on file   Social History Narrative   He is married and lives with his wife in [place].     Current Outpatient Prescriptions  Medication Sig Dispense Refill  . amiodarone (PACERONE) 200 MG tablet Take 400 mg (2 tablets) by mouth twice a day for 14 days then decrease to 400 mg (2 tablets) once a day for 14 days then decrease to 200 mg (1 tablet) once daily 90 tablet 3  . carvedilol (COREG) 25 MG tablet Take 1.5 tablets (37.5 mg total) by mouth 2 (two) times daily with a meal. (Patient taking differently: Take 37.5 mg by mouth 2 (two) times daily with a meal. TAKING 25 MG TWICE DAILY) 90 tablet 1  . ibuprofen (ADVIL,MOTRIN) 200 MG tablet Take 400 mg by mouth every 6 (six) hours as needed for mild pain.    . ramipril (ALTACE) 10 MG capsule TAKE 1 CAPSULE (10 MG TOTAL)  BY MOUTH DAILY. 90 capsule 6  . dabigatran (PRADAXA) 150 MG CAPS capsule Take 150 mg by mouth 2 (two) times daily. Reported on 03/27/2015     No current facility-administered medications for this visit.    Allergies  Allergen Reactions  . Penicillins Other (See Comments)    Syncope (Tolerates amoxicillin) Has patient had a PCN reaction causing immediate rash, facial/tongue/throat swelling, SOB or lightheadedness with hypotension: Yes Has patient had a PCN reaction causing severe rash involving mucus membranes or skin necrosis: No Has patient had a PCN reaction that required hospitalization No Has patient had a PCN reaction occurring within the last 10 years: No If all of the above answers are "NO", then may proceed with Cephalosporin use.       Review of Systems:   General:  normal appetite, decreased energy, + weight gain, no weight loss, no fever  Cardiac:  no chest pain with exertion, no chest pain at rest, + SOB with exertion, no resting SOB, no PND, no  orthopnea, + palpitations, + arrhythmia, + atrial fibrillation, no LE edema, + dizzy spells, no syncope  Respiratory:  + exertional shortness of breath, no home oxygen, no productive cough, no dry cough, no bronchitis, no wheezing, no hemoptysis, no asthma, no pain with inspiration or cough, + sleep apnea, no CPAP at night  GI:   no difficulty swallowing, no reflux, no frequent heartburn, no hiatal hernia, no abdominal pain, no constipation, no diarrhea, no hematochezia, no hematemesis, no melena  GU:   no dysuria,  no frequency, no urinary tract infection, no hematuria, no enlarged prostate, no kidney stones, no kidney disease  Vascular:  no pain suggestive of claudication, no pain in feet, no leg cramps, no varicose veins, no DVT, no non-healing foot ulcer  Neuro:   no stroke, no TIA's, no seizures, + headaches, no temporary blindness one eye,  no slurred speech, no peripheral neuropathy, no chronic pain, no instability of gait, no memory/cognitive dysfunction  Musculoskeletal: no arthritis, no joint swelling, no myalgias, no difficulty walking, normal mobility   Skin:   no rash, no itching, no skin infections, no pressure sores or ulcerations  Psych:   no anxiety, no depression, no nervousness, + unusual recent stress related to recent death of father-in-law  Eyes:   no blurry vision, no floaters, no recent vision changes, + wears glasses or contacts  ENT:   no hearing loss, + loose or painful teeth - only 3 teeth left on lower jaw, + upper dentures, last saw dentist several years ago  Hematologic:  no easy bruising, no abnormal bleeding, no clotting disorder, no frequent epistaxis  Endocrine:  no diabetes, does not check CBG's at home     Physical Exam:   BP 122/64 mmHg  Pulse 46  Resp 16  Ht 5\' 7"  (1.702 m)  Wt 208 lb (94.348 kg)  BMI 32.57 kg/m2  SpO2 95%  General:  Moderately obese, o/w  well-appearing  HEENT:  Unremarkable   Neck:   no JVD, no bruits, no adenopathy   Chest:   clear  to auscultation, symmetrical breath sounds, no wheezes, no rhonchi   CV:   Irregular rate and rhythm, no murmur   Abdomen:  soft, non-tender, no masses   Extremities:  warm, well-perfused, pulses diminished but palpable, no LE edema  Rectal/GU  Deferred  Neuro:   Grossly non-focal and symmetrical throughout  Skin:   Clean and dry, no rashes, no breakdown   Diagnostic Tests:  Transthoracic  Echocardiography  Patient:  Dyshaun, Bonzo MR #:    [id number] Study Date: 02/20/2013 Gender:   M Age:    57 Height:   172.7cm Weight:   91.2kg BSA:    2.65m^2 Pt. Status: Room:  ATTENDING  Default, Provider W REFERRING  [place], [place] ORDERING   Hillis Range, MD SONOGRAPHER Aida Raider, RDCS PERFORMING  [facility], [facility] cc:  ------------------------------------------------------------ LV EF: 50% -  55%  ------------------------------------------------------------ Indications:   Atrial fibrillation - 427.31.  ------------------------------------------------------------ History:  PMH: Acquired from the patient and from the patient's chart. PMH: Sinus Bradycardia. Dilated Cardiomyopathy. History of Pneumonia. Atrial Fibrillation. Sleep Apnea. Risk factors: Obese.  ------------------------------------------------------------ Study Conclusions  - Left ventricle: The cavity size was mildly dilated. Wall thickness was increased in a pattern of mild LVH. Systolic function was normal. The estimated ejection fraction was in the range of 50% to 55%. Wall motion was normal; there were no regional wall motion abnormalities. Features are consistent with a pseudonormal left ventricular filling pattern, with concomitant abnormal relaxation and increased filling pressure (grade 2 diastolic dysfunction). - Left atrium: The atrium was moderately dilated. Impressions:  - Compared to TEE 10/25/12, LV function has  improved.  ------------------------------------------------------------ Labs, prior tests, procedures, and surgery: Ablation-August 2014.     Transthoracic echocardiography. M-mode, complete 2D, spectral Doppler, and color Doppler. Height: Height: 172.7cm. Height: 68in. Weight: Weight: 91.2kg. Weight: 200.6lb. Body mass index: BMI: 30.6kg/m^2. Body surface area:  BSA: 2.75m^2. Blood pressure:   110/72. Patient status: Outpatient. Location: [place] [facility]  ------------------------------------------------------------  ------------------------------------------------------------ Left ventricle: The cavity size was mildly dilated. Wall thickness was increased in a pattern of mild LVH. Systolic function was normal. The estimated ejection fraction was in the range of 50% to 55%. Wall motion was normal; there were no regional wall motion abnormalities. Features are consistent with a pseudonormal left ventricular filling pattern, with concomitant abnormal relaxation and increased filling pressure (grade 2 diastolic dysfunction).  ------------------------------------------------------------ Aortic valve:  Trileaflet; normal thickness leaflets. Mobility was not restricted. Doppler: Transvalvular velocity was within the normal range. There was no stenosis. No regurgitation.  ------------------------------------------------------------ Aorta: Aortic root: The aortic root was normal in size.  ------------------------------------------------------------ Mitral valve:  Structurally normal valve.  Mobility was not restricted. Doppler: Transvalvular velocity was within the normal range. There was no evidence for stenosis. No regurgitation.  Peak gradient: 3mm Hg (D).  ------------------------------------------------------------ Left atrium: The atrium was moderately dilated.  ------------------------------------------------------------ Right ventricle:  The cavity size was normal. Systolic function was normal.  ------------------------------------------------------------ Pulmonic valve:  Doppler: Transvalvular velocity was within the normal range. There was no evidence for stenosis. Trivial regurgitation.  ------------------------------------------------------------ Tricuspid valve:  Structurally normal valve.  Doppler: Transvalvular velocity was within the normal range. No regurgitation.  ------------------------------------------------------------ Right atrium: The atrium was normal in size.  ------------------------------------------------------------ Pericardium: There was no pericardial effusion.  ------------------------------------------------------------ Systemic veins: Inferior vena cava: The vessel was normal in size.  ------------------------------------------------------------  2D measurements    Normal Doppler measurements  Normal Left ventricle         Left ventricle LVID ED,  58.8 mm   43-52  Ea, lat   8.11 cm/s ------ chord,             ann, tiss PLAX              DP LVID ES,  39.6 mm   23-38  E/Ea, lat 10.27    ------ chord,  ann, tiss PLAX              DP FS, chord,  33 %   >29   Ea, med   5.81 cm/s ------ PLAX              ann, tiss LVPW, ED  12.9 mm   ------ DP IVS/LVPW  0.92    <1.3  E/Ea, med 14.34    ------ ratio, ED           ann, tiss Ventricular septum       DP IVS, ED  11.9 mm   ------ LVOT LVOT              Peak vel,  106 cm/s ------ Diam, S   22 mm   ------ S Area    3.8 cm^2  ------ VTI, S   26.2 cm  ------ Diam     22 mm   ------ Stroke vol 99.6 ml  ------ Aorta             Stroke   48.6 ml/m^ ------ Root diam,  37 mm   ------ index      2 ED                Mitral valve Left atrium          Peak E vel 83.3 cm/s ------ AP dim    62 mm   ------ Peak A vel 59.8 cm/s ------ AP dim   3.02 cm/m^2 <2.2  Decelerati  282 ms  150-23 index             on time        0                Peak      3 mm Hg ------                gradient,                D                Peak E/A   1.4    ------                ratio                Right ventricle                Sa vel,   16.4 cm/s ------                lat ann,                tiss DP  ------------------------------------------------------------ Prepared and Electronically Authenticated by  Olga Millers 2014-12-24T11:08:[id number]   Transesophageal Echocardiography  Patient:  Marzell, Isakson MR #:    [id number] Study Date: 03/06/2015 Gender:   M Age:    59 Height:   170.2 cm Weight:   92.7 kg BSA:    2.12 m^2 Pt. Status: Room:  Julianne Rice, Elvina Sidle REFERRING  Rudi Coco C ADMITTING  Thurmon Fair, MD ATTENDING  Thurmon Fair, MD PERFORMING  Thurmon Fair, MD SONOGRAPHER Jimmy Reel, RDCS  cc:  ------------------------------------------------------------------- LV EF: 35% -  40%  ------------------------------------------------------------------- Indications:   Atrial fibrillation - 427.31.  ------------------------------------------------------------------- Study Conclusions  - Left ventricle: Systolic function was moderately reduced. The estimated ejection fraction was in the range of 35% to 40%. Diffuse hypokinesis. - Left atrium: The atrium  was moderately dilated. No evidence of thrombus in the atrial cavity or appendage. There was mildcontinuous spontaneous echo contrast (&quot;smoke&quot;) in the cavity and  the appendage. - Right atrium: The atrium was moderately dilated. No evidence of thrombus in the atrial cavity or appendage. - Atrial septum: No defect or patent foramen ovale was identified.  Diagnostic transesophageal echocardiography. 2D and color Doppler. Birthdate: Patient birthdate: 1955/08/15. Age: Patient is 60 yr old. Sex: Gender: male.  BMI: 32 kg/m^2. Blood pressure: 128/82 Patient status: Inpatient. Study date: Study date: 03/06/2015. Study time: 11:28 AM. Location: Bedside.  -------------------------------------------------------------------  ------------------------------------------------------------------- Left ventricle: Systolic function was moderately reduced. The estimated ejection fraction was in the range of 35% to 40%. Diffuse hypokinesis.  ------------------------------------------------------------------- Aortic valve:  Structurally normal valve. Trileaflet; normal thickness leaflets. Cusp separation was normal. Doppler: There was no significant regurgitation.  ------------------------------------------------------------------- Aorta: The aorta was normal, not dilated, and non-diseased. There was no atheroma. There was no evidence for dissection. Aortic root: The aortic root was not dilated. Ascending aorta: The ascending aorta was normal in size. Aortic arch: The aortic arch was normal in size. Descending aorta: The descending aorta was normal in size.  ------------------------------------------------------------------- Mitral valve:  Structurally normal valve.  Leaflet separation was normal. Doppler: There was trivial regurgitation.  ------------------------------------------------------------------- Left atrium: The atrium was moderately dilated. No evidence of thrombus in the atrial cavity or appendage. There was mildcontinuous spontaneous echo contrast (&quot;smoke&quot;) in the cavity and the appendage. The appendage  was morphologically a left appendage, multilobulated, and of normal size. Emptying velocity was normal.  ------------------------------------------------------------------- Atrial septum: No defect or patent foramen ovale was identified.  ------------------------------------------------------------------- Right ventricle: The cavity size was normal. Wall thickness was normal. Systolic function was normal.  ------------------------------------------------------------------- Pulmonic valve:  Structurally normal valve.  Doppler: There was trivial regurgitation.  ------------------------------------------------------------------- Tricuspid valve:  Structurally normal valve.  Leaflet separation was normal. Doppler: There was mild regurgitation.  ------------------------------------------------------------------- Pulmonary artery:  The main pulmonary artery was normal-sized.  ------------------------------------------------------------------- Right atrium: The atrium was moderately dilated. No evidence of thrombus in the atrial cavity or appendage. The appendage was morphologically a right appendage.  ------------------------------------------------------------------- Pericardium: There was no pericardial effusion.  ------------------------------------------------------------------- Post procedure conclusions Ascending Aorta:  - The aorta was normal, not dilated, and non-diseased.  ------------------------------------------------------------------- Prepared and Electronically Authenticated by  Thurmon Fair, MD 2017-01-06T16:28:24   CARDIAC CATHETERIZATION Procedures    Left Heart Cath and Coronary Angiography    Conclusion     Mid LAD lesion, 30% stenosed.  Prox LAD lesion, 30% stenosed.  Ost LAD to Prox LAD lesion, 20% stenosed.  There is moderate to severe left ventricular systolic dysfunction.  Nonischemic dilated cardiomyopathy with an  ejection fraction of 30-35%.  Nonobstructive CAD with 20 and 30% smooth narrowings in the proximal and mid LAD and otherwise normal coronary arteries in a dominant left circumflex system.  RECOMMENDATION: The patient will resume his Pradaxa tomorrow following today's catheterization. As per Dr. Johney Frame, he will be seeing Dr. Barry Dienes for consideration of Maze procedure.     Indications    Congestive dilated cardiomyopathy (HCC) [I42.0 (ICD-10-CM)]   Persistent atrial fibrillation (HCC) [I48.1 (ICD-10-CM)]    Technique and Indications    Mr. Blanton Proto is a 60 year old white male with a history of persistent atrial arrhythmias (atypical atrial flutter), AF, and reduced LV function. A recent TEE demonstrated an EF of 35-40%. The patient recently saw Dr. Johney Frame with recurrent atrial arrhythmia. He is to see Dr. Barry Dienes for consideration of  a Maze procedure. He is referred for cardiac catheterization.  The patient was brought to the cardiac catheterization lab in the fasting state. The patient was premedicated with Versed 2 mg and fentanyl 50 mcg. A right radial approach was utilized after an Allen's test verified adequate circulation. The right radial artery was punctured via the Seldinger technique, and a 6 [place] Glidesheath Slender was inserted without difficulty. A radial cocktail consisting of Verapamil, IV nitroglycerin, and lidocaine was administered. Weight adjusted heparin was administered. A safety J wire was advanced into the ascending aorta. Diagnostic catheterization was done with a 5 [place] TIG 4.0 catheter. A 5 French pigtail catheter was used for left ventriculography. A TR radial band was applied for hemostasis. The patient left the catheterization laboratory in stable condition.  Total contrast used: 80 cc Omnipaque Estimated blood loss <50 mL. There were no immediate complications during the procedure.    Coronary Findings    Dominance: Left   Left Anterior Descending    . Ost LAD to Prox LAD lesion, 20% stenosed.   . Prox LAD lesion, 30% stenosed.   . Mid LAD lesion, 30% stenosed.      Wall Motion                 Left Heart    Left Ventricle The left ventricle is enlarged. There is moderate to severe left ventricular systolic dysfunction. There are wall motion abnormalities in the left ventricle. Global hypocontractility with an ejection fraction of 30-35%. LVEDP 21 mm Hg.    Coronary Diagrams    Diagnostic Diagram            Implants    Name ID Temporary Type Supply   No information to display    PACS Images    Show images for Cardiac catheterization     Link to Procedure Log    Procedure Log      Hemo Data       Most Recent Value   AO Systolic Pressure  110 mmHg   AO Diastolic Pressure  68 mmHg   AO Mean  86 mmHg   LV Systolic Pressure  113 mmHg   LV Diastolic Pressure  -1 mmHg   LV EDP  21 mmHg   Arterial Occlusion Pressure Extended Systolic Pressure  109 mmHg   Arterial Occlusion Pressure Extended Diastolic Pressure  67 mmHg   Arterial Occlusion Pressure Extended Mean Pressure  86 mmHg   Left Ventricular Apex Extended Systolic Pressure  104 mmHg   Left Ventricular Apex Extended Diastolic Pressure  0 mmHg   Left Ventricular Apex Extended EDP Pressure  13 mmHg     Impression:  Patient has chronic persistent atrial fibrillation that has failed medical therapy on multiple agents and catheter-based ablation on 2 previous occasions, most recently in 2014. He presents with symptoms of exertional shortness of breath and fatigue consistent with chronic combined systolic and diastolic congestive heart failure, [employer] [place] [employer] functional class IIB.  I have personally reviewed the patient's recent transesophageal echocardiogram and diagnostic cardiac catheterization. 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  Although the patient has a reported history of rheumatic fever during childhood, he does not appear to have significant valvular disease. He has at least moderate enlargement of both the left and right atria. Options include continued medical therapy indefinitely versus surgical Maze procedure.  He may be a good candidate for  minimally invasive approach for surgery.   Plan:  I've discussed options at length with the patient in the office today. The indications, risks, and potential benefits of Maze procedure have been discussed at length.  Alternative surgical approaches have been discussed including a comparison between conventional sternotomy and minimally-invasive techniques.  The relative risks and benefits of each have been reviewed as they pertain to the patient's specific circumstances.  Expectations for his postoperative convalescence and long-term freedom from atrial dysrhythmias have been discussed at length.  All of his questions have been addressed.    The patient is interested in proceeding with maze procedure at some point in the future, although he desires to hold off until after he participates in a national bowling tournament in [place] next June.  Under the circumstances I have instructed the patient to resume taking Pradaxa but otherwise continue all current medications without change. Patient will return in 4 months to discuss options further. If he remains interested in proceeding with surgery we will obtain cardiac gated CT angiogram of the heart to rule out anomalous pulmonary venous circulation and CT angiogram of the abdomen and pelvis to investigate possible sites for peripheral arterial cannulation.  If the patient changes his mind and desires to proceed with surgery in the near future he will call and return to see [place] sooner.   I spent in excess of 90 minutes during the conduct of this office consultation and >50% of this  time involved direct face-to-face encounter with the patient for counseling and/or coordination of their care.   Salvatore Decent. Cornelius Moras, MD 03/27/2015 2:28 PM

## 2015-03-27 NOTE — Patient Instructions (Addendum)
Resume Pradaxa at previous dose  Continue all other previous medications without any changes at this time  See your dentist for routine cleaning and to take care of problematic tooth/teeth

## 2015-03-30 ENCOUNTER — Encounter: Payer: Managed Care, Other (non HMO) | Admitting: Thoracic Surgery (Cardiothoracic Vascular Surgery)

## 2015-03-30 ENCOUNTER — Other Ambulatory Visit (HOSPITAL_COMMUNITY): Payer: Self-pay | Admitting: *Deleted

## 2015-03-30 MED ORDER — CARVEDILOL 25 MG PO TABS: 37.5000 mg | ORAL_TABLET | Freq: Two times a day (BID) | ORAL | Status: DC

## 2015-04-01 ENCOUNTER — Encounter: Payer: Self-pay | Admitting: Internal Medicine

## 2015-04-09 ENCOUNTER — Telehealth: Payer: Self-pay | Admitting: Internal Medicine

## 2015-04-09 NOTE — Telephone Encounter (Addendum)
Spoke with patient and his HR is running 120-140.  He is still on Amiodarone 400mg  twice daily.  He knows he was to decrease his dose a few days ago and did not as his HR was up still.  He has a follow up appointment on Mon 04/13/15 at 8am.  He denies any dizziness, just feels tired.  Wonders if there is anything he should do prior to Monday.  I let him know I would discuss with Dr Johney Frame and call him back.  He says he is okay to wait until [facility] for his follow up but wanted [place] to be aware

## 2015-04-09 NOTE — Telephone Encounter (Signed)
New message      Pt states that his heart is "fast" again.  At rest, the HR is 120.  Please call

## 2015-04-13 ENCOUNTER — Ambulatory Visit: Payer: Managed Care, Other (non HMO) | Admitting: Internal Medicine

## 2015-04-13 ENCOUNTER — Encounter: Payer: Self-pay | Admitting: Internal Medicine

## 2015-04-13 ENCOUNTER — Ambulatory Visit (INDEPENDENT_AMBULATORY_CARE_PROVIDER_SITE_OTHER): Payer: Managed Care, Other (non HMO) | Admitting: Internal Medicine

## 2015-04-13 VITALS — BP 118/84 | HR 118 | Ht 67.0 in | Wt 207.4 lb

## 2015-04-13 DIAGNOSIS — I5022 Chronic systolic (congestive) heart failure: Secondary | ICD-10-CM | POA: Diagnosis not present

## 2015-04-13 DIAGNOSIS — I10 Essential (primary) hypertension: Secondary | ICD-10-CM

## 2015-04-13 DIAGNOSIS — I481 Persistent atrial fibrillation: Secondary | ICD-10-CM

## 2015-04-13 DIAGNOSIS — I4819 Other persistent atrial fibrillation: Secondary | ICD-10-CM

## 2015-04-13 LAB — CBC WITH DIFFERENTIAL/PLATELET
BASOS ABS: 0.1 10*3/uL (ref 0.0–0.1)
Basophils Relative: 1 % (ref 0–1)
EOS ABS: 0.2 10*3/uL (ref 0.0–0.7)
Eosinophils Relative: 3 % (ref 0–5)
HCT: 49.8 % (ref 39.0–52.0)
HEMOGLOBIN: 16.4 g/dL (ref 13.0–17.0)
LYMPHS ABS: 1.7 10*3/uL (ref 0.7–4.0)
LYMPHS PCT: 26 % (ref 12–46)
MCH: 30.1 pg (ref 26.0–34.0)
MCHC: 32.9 g/dL (ref 30.0–36.0)
MCV: 91.4 fL (ref 78.0–100.0)
MPV: 10.8 fL (ref 8.6–12.4)
Monocytes Absolute: 0.6 10*3/uL (ref 0.1–1.0)
Monocytes Relative: 9 % (ref 3–12)
NEUTROS ABS: 4.1 10*3/uL (ref 1.7–7.7)
NEUTROS PCT: 61 % (ref 43–77)
PLATELETS: 181 10*3/uL (ref 150–400)
RBC: 5.45 MIL/uL (ref 4.22–5.81)
RDW: 14.4 % (ref 11.5–15.5)
WBC: 6.7 10*3/uL (ref 4.0–10.5)

## 2015-04-13 LAB — BASIC METABOLIC PANEL
BUN: 19 mg/dL (ref 7–25)
CHLORIDE: 104 mmol/L (ref 98–110)
CO2: 24 mmol/L (ref 20–31)
CREATININE: 1.19 mg/dL (ref 0.70–1.33)
Calcium: 9.1 mg/dL (ref 8.6–10.3)
Glucose, Bld: 98 mg/dL (ref 65–99)
POTASSIUM: 4.5 mmol/L (ref 3.5–5.3)
SODIUM: 138 mmol/L (ref 135–146)

## 2015-04-13 MED ORDER — AMIODARONE HCL 200 MG PO TABS
400.0000 mg | ORAL_TABLET | Freq: Every day | ORAL | Status: DC
Start: 2015-04-13 — End: 2015-06-02

## 2015-04-13 MED ORDER — ROSUVASTATIN CALCIUM 5 MG PO TABS: 5.0000 mg | ORAL_TABLET | Freq: Every day | ORAL | Status: DC

## 2015-04-13 NOTE — Progress Notes (Signed)
PCP:  Sonda Primes, MD Primary Cardiology:  Dr Antoine Poche Primary EP:  Clella Mckeel  The patient presents today for urgent electrophysiology follow up. He has returned to persistent atrial arrhythmias (atypical atrial flutter) with tachycardia and reduced EF.  He reports very poor exercise tolerance and SOB with activity.  He is also very fatigued.  He was evaluated for GENETIC AF but did not qualify for the study.  2D echo in 2014 revealed severe LA enlargement with LA size of 60mm.  He underwent cath which revealed nonobstructive CAD.   He has been evaluated by Dr Cornelius Moras and is considering MAZE.  He has been on amiodarone 400mg  BID for several weeks. Today, he denies symptoms of palpitations, chest pain, orthopnea, PND, lower extremity edema, dizziness, presyncope, syncope, or neurologic sequela.  The patient feels that he is tolerating medications without difficulties and is otherwise without complaint today.   Past Medical History  Diagnosis Date  . Persistent atrial fibrillation (HCC)     a. s/p afib ablation 02/13/10 and redo ablation 10/26/12  . Atrial flutter [facility])     s/p CTI ablation 2006 by Ladona Ridgel  . Sleep apnea   . Non-ischemic cardiomyopathy (HCC)     tachycardiac mediated  . ALLERGIC RHINITIS   . Rheumatic fever     childhood  . Chronic systolic (congestive) heart failure (HCC)   . Essential hypertension   . Obesity   . CAD (coronary artery disease) 03/25/15    nonobstructive by cath   Past Surgical History  Procedure Laterality Date  . Atrial ablation surgery  2006    CTI ablation  . Joint replacement Left     toe joint  . Tonsillectomy    . Atrial fibrillation ablation  02/13/10  . Tee without cardioversion N/A 10/25/2012    Procedure: TRANSESOPHAGEAL ECHOCARDIOGRAM (TEE);  Surgeon: Lewayne Bunting, MD;  Location: [facility] ENDOSCOPY;  Service: Cardiovascular;  Laterality: N/A;  . Atrial fibrillation ablation N/A 10/26/2012    Procedure: ATRIAL FIBRILLATION ABLATION;  Surgeon:  Hillis Range, MD;  Location: [facility] CATH LAB;  Service: Cardiovascular;  Laterality: N/A;  . Tee without cardioversion N/A 03/06/2015    Procedure: TRANSESOPHAGEAL ECHOCARDIOGRAM (TEE);  Surgeon: Thurmon Fair, MD;  Location: [facility] ENDOSCOPY;  Service: Cardiovascular;  Laterality: N/A;  . Cardioversion N/A 03/06/2015    Procedure: CARDIOVERSION;  Surgeon: Thurmon Fair, MD;  Location: [facility] ENDOSCOPY;  Service: Cardiovascular;  Laterality: N/A;  . Cardiac catheterization N/A 03/25/2015    Procedure: Left Heart Cath and Coronary Angiography;  Surgeon: Lennette Bihari, MD;  Location: [facility] INVASIVE CV LAB;  Service: Cardiovascular;  Laterality: N/A;    Current Outpatient Prescriptions  Medication Sig Dispense Refill  . amiodarone (PACERONE) 200 MG tablet Take 400 mg (2 tablets) by mouth twice a day for 14 days then decrease to 400 mg (2 tablets) once a day for 14 days then decrease to 200 mg (1 tablet) once daily 90 tablet 3  . carvedilol (COREG) 25 MG tablet Take 1.5 tablets (37.5 mg total) by mouth 2 (two) times daily with a meal. 90 tablet 3  . dabigatran (PRADAXA) 150 MG CAPS capsule Take 150 mg by mouth 2 (two) times daily. Reported on 03/27/2015    . ibuprofen (ADVIL,MOTRIN) 200 MG tablet Take 400 mg by mouth every 6 (six) hours as needed for mild pain.    . ramipril (ALTACE) 10 MG capsule TAKE 1 CAPSULE (10 MG TOTAL) BY MOUTH DAILY. 90 capsule 6   No current facility-administered medications  for this visit.    Allergies  Allergen Reactions  . Penicillins Other (See Comments)    Syncope (Tolerates amoxicillin) Has patient had a PCN reaction causing immediate rash, facial/tongue/throat swelling, SOB or lightheadedness with hypotension: Yes Has patient had a PCN reaction causing severe rash involving mucus membranes or skin necrosis: No Has patient had a PCN reaction that required hospitalization No Has patient had a PCN reaction occurring within the last 10 years: No If all of the above answers are  "NO", then may proceed with Cephalosporin use.     Social History   Social History  . Marital Status: Married    Spouse Name: N/A  . Number of Children: 1  . 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 Smoking status: Never Smoker   . Smokeless tobacco: Never Used  . Alcohol Use: No  . Drug Use: No  . Sexual Activity: Not on file   Other Topics Concern  . Not on file   Social History Narrative   He is married and lives with his wife in [place].     Family History  Problem Relation Age of Onset  . Lung cancer Father     smoker  . Lung cancer Mother     smoker  . Diabetes Neg Hx   . Prostate cancer Neg Hx   . Colon cancer Neg Hx     ROS-  All systems are reviewed and are negative except as outlined in the HPI above  Physical Exam: Filed Vitals:   04/13/15 0754  BP: 118/84  Pulse: 118  Height: 5\' 7"  (1.702 m)  Weight: 207 lb 6.4 oz (94.076 kg)    GEN- The patient is well appearing, alert and oriented x 3 today.   Head- normocephalic, atraumatic Eyes-  Sclera clear, conjunctiva pink Ears- hearing intact Oropharynx- clear Neck- supple, no JVP Lymph- no cervical lymphadenopathy Lungs- Clear to ausculation bilaterally, normal work of breathing Heart- tachycardic irregular rhythm GI- soft, NT, ND, + BS Extremities- no clubbing, cyanosis, or edema Neuro- strength and sensation are intact  EKG today reveals atypical atrial flutter with V rate 118 bpm, nonspecific St/T changes Dr Barry Dienes notes reviewed  Assessment and Plan:  1. Persistent afib Now back in afib/ atypical atrial flutter Given prior severe LA enlargement, I doubt that we will be very successful in maintaining sinus without MAZE.  Recent STS guidelines would suggest IIA indication for MAZE in this patient with refractory afib.  I have strongly encouraged him to proceed. Rate control is difficult (he is on high dose beta blocker) and is the  likely cause for his reduced EF. Continue pradaxa Reduce amiodarone to 400mg  daily.  Proceed with repeat cardioversion.  Risks of procedure discussed with the patient.  He is instructed to remain compliant with pradaxa without interuption.  I have encouraged him to hold coreg night before and morning of his cardioversion.   If he does not maintain sinus rhythm with cardioversion then we will try to move up his follow-up with Dr Cornelius Moras.    2. Acute systolic dysfunction EF acutely depressed, likely tachy mediated.  Previously normalized with sinus.  On coreg and ace inhibitor  3. HTN Stable No change required today  4. CAD Nonobstructive Check fasting lipids Start crestor 5mg  daily  Follow-up with Lupita Leash in AF clinic in 4 weeks.  If out of rhythm at that time, should have his appointment with  Dr Cornelius Moras moved up.  Our only other real option is AV nodal ablation/ BiV pacemaker.  Given his young age, I would like to avoid this if possible.  I think he would do better with MAZE. Given refractory atrial arrhythmias and acutely reduced EF, this is a very complex patient.  A high level of decision making was required for the visit today.  Hillis Range MD, [facility] 04/13/2015 8:21 AM

## 2015-04-13 NOTE — Patient Instructions (Addendum)
Medication Instructions:  1) Decrease Amiodarone to 400mg  daily 2) Start Crestor 5 mg daily   Labwork:  Your physician recommends that you return for lab work today for procedure: BMP/CBC and also needs a fasting lipid at the hospital the day of DCCV   Testing/Procedures: Your physician has recommended that you have a Cardioversion (DCCV). Electrical Cardioversion uses a jolt of electricity to your heart either through paddles or wired patches attached to your chest. This is a controlled, usually prescheduled, procedure. Defibrillation is done under light anesthesia in the hospital, and you usually go home the day of the procedure. This is done to get your heart back into a normal rhythm. You are not awake for the procedure. Please see the instruction sheet given to you today.  Please arrive at the [facility] on Wed 04/15/15 at 9:30am.  Do NOT eat or drink after midnight the night before.  Do NOT take your Carvedilol the night before and morning of procedure   Follow-Up: Your physician recommends that you schedule a follow-up appointment in: 4 weeks with Rudi Coco, NP   Any Other Special Instructions Will Be Listed Below (If Applicable).     If you need a refill on your cardiac medications before your next appointment, please call your pharmacy.

## 2015-04-14 ENCOUNTER — Other Ambulatory Visit: Payer: Self-pay | Admitting: Nurse Practitioner

## 2015-04-15 ENCOUNTER — Encounter (HOSPITAL_COMMUNITY): Payer: Self-pay | Admitting: *Deleted

## 2015-04-15 ENCOUNTER — Ambulatory Visit (HOSPITAL_COMMUNITY)
Admission: RE | Admit: 2015-04-15 | Discharge: 2015-04-15 | Disposition: A | Discharge status: Home / Self Care | Payer: Managed Care, Other (non HMO) | Source: Ambulatory Visit | Attending: Cardiovascular Disease | Admitting: Cardiovascular Disease

## 2015-04-15 ENCOUNTER — Ambulatory Visit (HOSPITAL_COMMUNITY): Payer: Managed Care, Other (non HMO) | Admitting: Anesthesiology

## 2015-04-15 ENCOUNTER — Encounter (HOSPITAL_COMMUNITY)
Admission: RE | Disposition: A | Discharge status: Home / Self Care | Payer: Self-pay | Source: Ambulatory Visit | Attending: Cardiovascular Disease

## 2015-04-15 DIAGNOSIS — I2510 Atherosclerotic heart disease of native coronary artery without angina pectoris: Secondary | ICD-10-CM | POA: Insufficient documentation

## 2015-04-15 DIAGNOSIS — Z880 Allergy status to penicillin: Secondary | ICD-10-CM | POA: Diagnosis not present

## 2015-04-15 DIAGNOSIS — I481 Persistent atrial fibrillation: Secondary | ICD-10-CM | POA: Diagnosis not present

## 2015-04-15 DIAGNOSIS — I484 Atypical atrial flutter: Secondary | ICD-10-CM | POA: Diagnosis not present

## 2015-04-15 DIAGNOSIS — I429 Cardiomyopathy, unspecified: Secondary | ICD-10-CM | POA: Insufficient documentation

## 2015-04-15 DIAGNOSIS — I10 Essential (primary) hypertension: Secondary | ICD-10-CM | POA: Insufficient documentation

## 2015-04-15 DIAGNOSIS — I5022 Chronic systolic (congestive) heart failure: Secondary | ICD-10-CM | POA: Insufficient documentation

## 2015-04-15 DIAGNOSIS — I4891 Unspecified atrial fibrillation: Secondary | ICD-10-CM | POA: Diagnosis not present

## 2015-04-15 HISTORY — PX: CARDIOVERSION: SHX1299

## 2015-04-15 SURGERY — CARDIOVERSION
Anesthesia: Monitor Anesthesia Care

## 2015-04-15 MED ORDER — LIDOCAINE HCL (CARDIAC) 20 MG/ML IV SOLN
INTRAVENOUS | Status: DC | PRN
  Administered 2015-04-15: 60 mg via INTRAVENOUS

## 2015-04-15 MED ORDER — SODIUM CHLORIDE 0.9 % IV SOLN
INTRAVENOUS | Status: DC
  Administered 2015-04-15: 500 mL via INTRAVENOUS

## 2015-04-15 MED ORDER — PROPOFOL 10 MG/ML IV BOLUS
INTRAVENOUS | Status: DC | PRN
  Administered 2015-04-15: 120 mg via INTRAVENOUS

## 2015-04-15 NOTE — Anesthesia Procedure Notes (Signed)
Procedure Name: MAC Date/Time: 04/15/2015 11:10 AM Performed by: Lovie Chol Pre-anesthesia Checklist: Patient identified, Emergency Drugs available, Suction available, Patient being monitored and Timeout performed Patient Re-evaluated:Patient Re-evaluated prior to inductionOxygen Delivery Method: Ambu bag

## 2015-04-15 NOTE — H&P (View-Only) (Signed)
 PCP:  Alex Plotnikov, MD Primary Cardiology:  Dr Hochrein Primary EP:  Lakeisa Heninger  The patient presents today for urgent electrophysiology follow up. He has returned to persistent atrial arrhythmias (atypical atrial flutter) with tachycardia and reduced EF.  He reports very poor exercise tolerance and SOB with activity.  He is also very fatigued.  He was evaluated for GENETIC AF but did not qualify for the study.  2D echo in 2014 revealed severe LA enlargement with LA size of 62mm.  He underwent cath which revealed nonobstructive CAD.   He has been evaluated by Dr Owen and is considering MAZE.  He has been on amiodarone 400mg BID for several weeks. Today, he denies symptoms of palpitations, chest pain, orthopnea, PND, lower extremity edema, dizziness, presyncope, syncope, or neurologic sequela.  The patient feels that he is tolerating medications without difficulties and is otherwise without complaint today.   Past Medical History  Diagnosis Date  . Persistent atrial fibrillation (HCC)     a. s/p afib ablation 02/13/10 and redo ablation 10/26/12  . Atrial flutter (HCC)     s/p CTI ablation 2006 by [facility]  . Sleep apnea   . Non-ischemic cardiomyopathy (HCC)     tachycardiac mediated  . ALLERGIC RHINITIS   . Rheumatic fever     childhood  . Chronic systolic (congestive) heart failure (HCC)   . Essential hypertension   . Obesity   . CAD (coronary artery disease) 03/25/15    nonobstructive by cath   Past Surgical History  Procedure Laterality Date  . Atrial ablation surgery  2006    CTI ablation  . Joint replacement Left     toe joint  . Tonsillectomy    . Atrial fibrillation ablation  02/13/10  . Tee without cardioversion N/A 10/25/2012    Procedure: TRANSESOPHAGEAL ECHOCARDIOGRAM (TEE);  Surgeon: Brian S Crenshaw, MD;  Location: [facility] ENDOSCOPY;  Service: Cardiovascular;  Laterality: N/A;  . Atrial fibrillation ablation N/A 10/26/2012    Procedure: ATRIAL FIBRILLATION ABLATION;  Surgeon:  Leyani Gargus, MD;  Location: [facility] CATH LAB;  Service: Cardiovascular;  Laterality: N/A;  . Tee without cardioversion N/A 03/06/2015    Procedure: TRANSESOPHAGEAL ECHOCARDIOGRAM (TEE);  Surgeon: Mihai Croitoru, MD;  Location: [facility] ENDOSCOPY;  Service: Cardiovascular;  Laterality: N/A;  . Cardioversion N/A 03/06/2015    Procedure: CARDIOVERSION;  Surgeon: Mihai Croitoru, MD;  Location: [facility] ENDOSCOPY;  Service: Cardiovascular;  Laterality: N/A;  . Cardiac catheterization N/A 03/25/2015    Procedure: Left Heart Cath and Coronary Angiography;  Surgeon: Kolston A Kelly, MD;  Location: [facility] INVASIVE CV LAB;  Service: Cardiovascular;  Laterality: N/A;    Current Outpatient Prescriptions  Medication Sig Dispense Refill  . amiodarone (PACERONE) 200 MG tablet Take 400 mg (2 tablets) by mouth twice a day for 14 days then decrease to 400 mg (2 tablets) once a day for 14 days then decrease to 200 mg (1 tablet) once daily 90 tablet 3  . carvedilol (COREG) 25 MG tablet Take 1.5 tablets (37.5 mg total) by mouth 2 (two) times daily with a meal. 90 tablet 3  . dabigatran (PRADAXA) 150 MG CAPS capsule Take 150 mg by mouth 2 (two) times daily. Reported on 03/27/2015    . ibuprofen (ADVIL,MOTRIN) 200 MG tablet Take 400 mg by mouth every 6 (six) hours as needed for mild pain.    . ramipril (ALTACE) 10 MG capsule TAKE 1 CAPSULE (10 MG TOTAL) BY MOUTH DAILY. 90 capsule 6   No current facility-administered medications   for this visit.    Allergies  Allergen Reactions  . Penicillins Other (See Comments)    Syncope (Tolerates amoxicillin) Has patient had a PCN reaction causing immediate rash, facial/tongue/throat swelling, SOB or lightheadedness with hypotension: Yes Has patient had a PCN reaction causing severe rash involving mucus membranes or skin necrosis: No Has patient had a PCN reaction that required hospitalization No Has patient had a PCN reaction occurring within the last 10 years: No If all of the above answers are  "NO", then may proceed with Cephalosporin use.     Social History   Social History  . Marital Status: Married    Spouse Name: N/A  . Number of Children: 1  . 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 Smoking status: Never Smoker   . Smokeless tobacco: Never Used  . Alcohol Use: No  . Drug Use: No  . Sexual Activity: Not on file   Other Topics Concern  . Not on file   Social History Narrative   He is married and lives with his wife in [place].     Family History  Problem Relation Age of Onset  . Lung cancer Father     smoker  . Lung cancer Mother     smoker  . Diabetes Neg Hx   . Prostate cancer Neg Hx   . Colon cancer Neg Hx     ROS-  All systems are reviewed and are negative except as outlined in the HPI above  Physical Exam: Filed Vitals:   04/13/15 0754  BP: 118/84  Pulse: 118  Height: 5' 7" (1.702 m)  Weight: 207 lb 6.4 oz (94.076 kg)    GEN- The patient is well appearing, alert and oriented x 3 today.   Head- normocephalic, atraumatic Eyes-  Sclera clear, conjunctiva pink Ears- hearing intact Oropharynx- clear Neck- supple, no JVP Lymph- no cervical lymphadenopathy Lungs- Clear to ausculation bilaterally, normal work of breathing Heart- tachycardic irregular rhythm GI- soft, NT, ND, + BS Extremities- no clubbing, cyanosis, or edema Neuro- strength and sensation are intact  EKG today reveals atypical atrial flutter with V rate 118 bpm, nonspecific St/T changes Dr Owens notes reviewed  Assessment and Plan:  1. Persistent afib Now back in afib/ atypical atrial flutter Given prior severe LA enlargement, I doubt that we will be very successful in maintaining sinus without MAZE.  Recent STS guidelines would suggest IIA indication for MAZE in this patient with refractory afib.  I have strongly encouraged him to proceed. Rate control is difficult (he is on high dose beta blocker) and is the  likely cause for his reduced EF. Continue pradaxa Reduce amiodarone to 400mg daily.  Proceed with repeat cardioversion.  Risks of procedure discussed with the patient.  He is instructed to remain compliant with pradaxa without interuption.  I have encouraged him to hold coreg night before and morning of his cardioversion.   If he does not maintain sinus rhythm with cardioversion then we will try to move up his follow-up with Dr Owen.    2. Acute systolic dysfunction EF acutely depressed, likely tachy mediated.  Previously normalized with sinus.  On coreg and ace inhibitor  3. HTN Stable No change required today  4. CAD Nonobstructive Check fasting lipids Start crestor 5mg daily  Follow-up with Donna in AF clinic in 4 weeks.  If out of rhythm at that time, should have his appointment with   Dr Owen moved up.  Our only other real option is AV nodal ablation/ BiV pacemaker.  Given his young age, I would like to avoid this if possible.  I think he would do better with MAZE. Given refractory atrial arrhythmias and acutely reduced EF, this is a very complex patient.  A high level of decision making was required for the visit today.  Sharone Almond MD, FACC 04/13/2015 8:21 AM   

## 2015-04-15 NOTE — Anesthesia Preprocedure Evaluation (Signed)
Anesthesia Evaluation  Patient identified by MRN, date of birth, ID band Patient awake    Reviewed: Allergy & Precautions, H&P , NPO status , Patient's Chart, lab work & pertinent test results, reviewed documented beta blocker date and time   History of Anesthesia Complications Negative for: history of anesthetic complications  Airway Mallampati: II  TM Distance: >3 FB Neck ROM: full    Dental no notable dental hx. (+) Edentulous Upper, Missing, Poor Dentition, Dental Advisory Given   Pulmonary neg pulmonary ROS,    Pulmonary exam normal breath sounds clear to auscultation       Cardiovascular hypertension, Pt. on medications and Pt. on home beta blockers (-) angina+ dysrhythmias Atrial Fibrillation  Rhythm:regular Rate:Normal  '14 ECHO: EF 50-55%, valves OK   Neuro/Psych negative neurological ROS     GI/Hepatic negative GI ROS, Neg liver ROS,   Endo/Other  Morbid obesity  Renal/GU negative Renal ROS     Musculoskeletal   Abdominal   Peds  Hematology  (+) Blood dyscrasia (xarelto), ,   Anesthesia Other Findings   Reproductive/Obstetrics                             Anesthesia Physical  Anesthesia Plan  ASA: II  Anesthesia Plan: MAC   Post-op Pain Management:    Induction: Intravenous  Airway Management Planned: Mask and Natural Airway  Additional Equipment:   Intra-op Plan:   Post-operative Plan:   Informed Consent: I have reviewed the patients History and Physical, chart, labs and discussed the procedure including the risks, benefits and alternatives for the proposed anesthesia with the patient or authorized representative who has indicated his/her understanding and acceptance.   Dental Advisory Given  Plan Discussed with: CRNA and Surgeon  Anesthesia Plan Comments: (Discussed sedation and potential to need to place airway or ETT if warranted by clinical changes  intra-operatively. We will start procedure as MAC.)       Anesthesia Quick Evaluation

## 2015-04-15 NOTE — Discharge Instructions (Signed)
Electrical Cardioversion, Care After °Refer to this sheet in the next few weeks. These instructions provide you with information on caring for yourself after your procedure. Your health care provider may also give you more specific instructions. Your treatment has been planned according to current medical practices, but problems sometimes occur. Call your health care provider if you have any problems or questions after your procedure. °WHAT TO EXPECT AFTER THE PROCEDURE °After your procedure, it is typical to have the following sensations: °· Some redness on the skin where the shocks were delivered. If this is tender, a sunburn lotion or hydrocortisone cream may help. °· Possible return of an abnormal heart rhythm within hours or days after the procedure. °HOME CARE INSTRUCTIONS °· Take medicines only as directed by your health care provider. Be sure you understand how and when to take your medicine. °· Learn how to feel your pulse and check it often. °· Limit your activity for 48 hours after the procedure or as directed by your health care provider. °· Avoid or minimize caffeine and other stimulants as directed by your health care provider. °SEEK MEDICAL CARE IF: °· You feel like your heart is beating too fast or your pulse is not regular. °· You have any questions about your medicines. °· You have bleeding that will not stop. °SEEK IMMEDIATE MEDICAL CARE IF: °· You are dizzy or feel faint. °· It is hard to breathe or you feel short of breath. °· There is a change in discomfort in your chest. °· Your speech is slurred or you have trouble moving an arm or leg on one side of your body. °· You get a serious muscle cramp that does not go away. °· Your fingers or toes turn cold or blue. °  °This information is not intended to replace advice given to you by your health care provider. Make sure you discuss any questions you have with your health care provider. °  °Document Released: 12/05/2012 Document Revised: 03/07/2014  Document Reviewed: 12/05/2012 °Elsevier Interactive Patient Education ©2016 [employer] ° °

## 2015-04-15 NOTE — Transfer of Care (Signed)
Immediate Anesthesia Transfer of Care Note  Patient: [name]  Procedure(s) Performed: Procedure(s): CARDIOVERSION (N/A)  Patient Location: PACU  Anesthesia Type:MAC  Level of Consciousness: awake, oriented and patient cooperative  Airway & Oxygen Therapy: Patient Spontanous Breathing and Patient connected to nasal cannula oxygen  Post-op Assessment: Report given to RN and Post -op Vital signs reviewed and stable  Post vital signs: Reviewed  Last Vitals:  Filed Vitals:   04/15/15 0917  BP: 144/107  Temp: 36.6 C  Resp: 16    Complications: No apparent anesthesia complications

## 2015-04-15 NOTE — Interval H&P Note (Signed)
History and Physical Interval Note:  04/15/2015 11:07 AM  [name]  has presented today for surgery, with the diagnosis of AFLUTTER  The various methods of treatment have been discussed with the patient and family. After consideration of risks, benefits and other options for treatment, the patient has consented to  Procedure(s): CARDIOVERSION (N/A) as a surgical intervention .  The patient's history has been reviewed, patient examined, no change in status, stable for surgery.  I have reviewed the patient's chart and labs.  Questions were answered to the patient's satisfaction.     [name], Deloris Ping

## 2015-04-15 NOTE — CV Procedure (Signed)
    Cardioversion Note  [name] [id number] Feb 05, 1956  Procedure: DC Cardioversion Indications: atrial fib   Procedure Details Consent: Obtained Time Out: Verified patient identification, verified procedure, site/side was marked, verified correct patient position, special equipment/implants available, Radiology Safety Procedures followed,  medications/allergies/relevent history reviewed, required imaging and test results available.  Performed  The patient has been on adequate anticoagulation.  The patient received IV Lidocane 60 mg followed by Propofol 110 mg IV  for sedation.  Synchronous cardioversion was performed at 120  joules.  The cardioversion was successful     Complications: No apparent complications Patient did tolerate procedure well.   Vesta Mixer, Adam Hageman., MD, [facility] 04/15/2015, 11:12 AM

## 2015-04-15 NOTE — Anesthesia Postprocedure Evaluation (Signed)
Anesthesia Post Note  Patient: [name]  Procedure(s) Performed: Procedure(s) (LRB): CARDIOVERSION (N/A)  Patient location during evaluation: PACU Anesthesia Type: MAC Level of consciousness: awake and alert Pain management: pain level controlled Vital Signs Assessment: post-procedure vital signs reviewed and stable Respiratory status: spontaneous breathing, nonlabored ventilation, respiratory function stable and patient connected to nasal cannula oxygen Cardiovascular status: stable and blood pressure returned to baseline Anesthetic complications: no    Last Vitals:  Filed Vitals:   04/15/15 1130 04/15/15 1137  BP: 116/78 120/76  Pulse: 45 63  Temp:    Resp: 19 21    Last Pain: There were no vitals filed for this visit.               Jiles Garter

## 2015-04-16 ENCOUNTER — Encounter (HOSPITAL_COMMUNITY): Payer: Self-pay | Admitting: Cardiovascular Disease

## 2015-04-21 ENCOUNTER — Other Ambulatory Visit: Payer: Self-pay | Admitting: *Deleted

## 2015-04-21 DIAGNOSIS — I5023 Acute on chronic systolic (congestive) heart failure: Secondary | ICD-10-CM

## 2015-04-30 ENCOUNTER — Ambulatory Visit (HOSPITAL_COMMUNITY)
Admission: RE | Admit: 2015-04-30 | Discharge: 2015-04-30 | Disposition: A | Discharge status: Home / Self Care | Payer: Managed Care, Other (non HMO) | Source: Ambulatory Visit | Attending: Thoracic Surgery (Cardiothoracic Vascular Surgery) | Admitting: Thoracic Surgery (Cardiothoracic Vascular Surgery)

## 2015-04-30 DIAGNOSIS — I5023 Acute on chronic systolic (congestive) heart failure: Secondary | ICD-10-CM

## 2015-04-30 DIAGNOSIS — I771 Stricture of artery: Secondary | ICD-10-CM | POA: Insufficient documentation

## 2015-04-30 DIAGNOSIS — I517 Cardiomegaly: Secondary | ICD-10-CM | POA: Insufficient documentation

## 2015-04-30 DIAGNOSIS — K8020 Calculus of gallbladder without cholecystitis without obstruction: Secondary | ICD-10-CM | POA: Diagnosis not present

## 2015-04-30 DIAGNOSIS — I708 Atherosclerosis of other arteries: Secondary | ICD-10-CM | POA: Insufficient documentation

## 2015-04-30 MED ORDER — IOHEXOL 350 MG/ML SOLN
75.0000 mL | Freq: Once | INTRAVENOUS | Status: AC | PRN
  Administered 2015-04-30: 75 mL via INTRAVENOUS

## 2015-04-30 MED ORDER — IOHEXOL 350 MG/ML SOLN
80.0000 mL | Freq: Once | INTRAVENOUS | Status: AC | PRN
  Administered 2015-04-30: 80 mL via INTRAVENOUS

## 2015-04-30 MED ORDER — METOPROLOL TARTRATE 1 MG/ML IV SOLN
INTRAVENOUS | Status: AC
  Filled 2015-04-30: qty 10

## 2015-04-30 MED ORDER — METOPROLOL TARTRATE 1 MG/ML IV SOLN
5.0000 mg | INTRAVENOUS | Status: AC | PRN
  Administered 2015-04-30 (×2): 5 mg via INTRAVENOUS

## 2015-05-04 ENCOUNTER — Encounter: Payer: Self-pay | Admitting: Thoracic Surgery (Cardiothoracic Vascular Surgery)

## 2015-05-04 ENCOUNTER — Ambulatory Visit (INDEPENDENT_AMBULATORY_CARE_PROVIDER_SITE_OTHER): Payer: Managed Care, Other (non HMO) | Admitting: Thoracic Surgery (Cardiothoracic Vascular Surgery)

## 2015-05-04 ENCOUNTER — Other Ambulatory Visit: Payer: Self-pay | Admitting: *Deleted

## 2015-05-04 VITALS — BP 128/76 | HR 42 | Resp 20 | Ht 67.0 in

## 2015-05-04 DIAGNOSIS — I4891 Unspecified atrial fibrillation: Secondary | ICD-10-CM

## 2015-05-04 DIAGNOSIS — I5022 Chronic systolic (congestive) heart failure: Secondary | ICD-10-CM | POA: Diagnosis not present

## 2015-05-04 DIAGNOSIS — I481 Persistent atrial fibrillation: Secondary | ICD-10-CM | POA: Diagnosis not present

## 2015-05-04 DIAGNOSIS — I482 Chronic atrial fibrillation, unspecified: Secondary | ICD-10-CM

## 2015-05-04 DIAGNOSIS — I4819 Other persistent atrial fibrillation: Secondary | ICD-10-CM

## 2015-05-04 NOTE — Progress Notes (Signed)
[address]       [name] [address]             [phone]     CARDIOTHORACIC SURGERY OFFICE NOTE  Referring Provider is Hillis Range, MD PCP is Sonda Primes, MD   HPI:  Patient returns to the office today for follow-up of chronic recurrent persistent atrial fibrillation that has failed medical therapy with multiple agents and catheter-based ablation on 2 previous occasions. He was originally seen in consultation on 03/27/2015 at which time we discussed the indications, risks, potential benefits of a surgical maze procedure.  At that time the patient was interested in considering surgery but he wanted to hold off until next summer. Since then he has had increasing problems with atrial fibrillation and he underwent another unsuccessful attempt at DC cardioversion. He has now decided that he would rather proceed with surgery as soon as practical. He reports stable symptoms of exertional shortness of breath and fatigue.  He has been tolerating amiodarone recently but he still keeps going in and out of atrial fibrillation.  The remainder of his review of systems is unchanged from previously.   Current Outpatient Prescriptions  Medication Sig Dispense Refill  . amiodarone (PACERONE) 200 MG tablet Take 2 tablets (400 mg total) by mouth daily. 180 tablet 3  . carvedilol (COREG) 25 MG tablet Take 1.5 tablets (37.5 mg total) by mouth 2 (two) times daily with a meal. 90 tablet 3  . dabigatran (PRADAXA) 150 MG CAPS capsule Take 150 mg by mouth 2 (two) times daily. Reported on 05/04/2015    . ramipril (ALTACE) 10 MG capsule TAKE 1 CAPSULE (10 MG TOTAL) BY MOUTH DAILY. 90 capsule 6  . ibuprofen (ADVIL,MOTRIN) 200 MG tablet Take 400 mg by mouth every 6 (six) hours as needed for mild pain. Reported on 05/04/2015    . rosuvastatin (CRESTOR) 5 MG tablet Take 1 tablet (5 mg total) by mouth daily. (Patient not taking: Reported on 04/30/2015) 90 tablet 3   No current  facility-administered medications for this visit.      Physical Exam:   BP 128/76 mmHg  Pulse 42  Resp 20  Ht  (1.702 m)  SpO2 98%  General:  All appearing  Chest:   Clear to auscultation  CV:   Irregular rate and rhythm  Incisions:  n/a  Abdomen:  Soft and nontender  Extremities:  Warm and well-perfused  Diagnostic Tests:  Cardiac CTA MEDICATIONS: Lopressor  Patient was in rapid atrial fibrillation. A retrospective scan was used TECHNIQUE: The patient was scanned on a [employer]ips 256 slice scanner. Gantry rotation speed was 270 msecs. Collimation was .9mm. A 120 kV retrospective scan was triggered in the descending thoracic aorta at 111 HU's. Average HR during the scan was 90 bpm. The 3D data set was interpreted on a dedicated work station using MPR, MIP and VRT modes. A total of 80cc of contrast was used. FINDINGS: Non-cardiac: See separate report from [facility]. No significant findings on limited lung and soft tissue windows. There was moderate biatrial enlargement. There was no ASD/PFO noted. There was no LAA thrombus. There were 4 pulmonary veins draining normally into the LA with no anomalies LUPV: Ostium 14.5 mm Area 1.6 cm2 LLPV: Ostium 11.8 mm Area 1.8 cm2 RUPV: Ostium 17.8 mm Area 2.2 cm2 RLPV: Ostium 14.7 mm Area 1.5 cm2 IMPRESSION: 1) Moderate Biatrial Enlargement 2) No ASD/PFO 3) 4 normal PV;s draining into the LA with no  anomalies 4) No LAA thrombus Charlton Haws Electronically Signed  By: Charlton Haws M.D.  On: 04/30/2015 13:40       CTA ABDOMEN AND PELVIS WITH CONTRAST  TECHNIQUE: Multidetector CT imaging of the abdomen and pelvis was performed using the standard protocol during bolus administration of intravenous contrast. Multiplanar reconstructed images and MIPs were obtained and reviewed to evaluate the vascular anatomy.  CONTRAST: 75mL OMNIPAQUE IOHEXOL 350 MG/ML SOLN  COMPARISON: CT the abdomen and  pelvis 05/30/2006.  FINDINGS: Lower chest: Mild cardiomegaly. Small hiatal hernia.  Hepatobiliary: No cystic or solid hepatic lesions. No intra or extrahepatic biliary ductal dilatation. Small calcified gallstones lying dependently in the gallbladder measuring up to 9 mm. Gallbladder is nearly decompressed. No gallbladder wall thickening or surrounding inflammatory changes.  Pancreas: No pancreatic mass. No pancreatic ductal dilatation. No pancreatic or peripancreatic fluid or inflammatory changes.  Spleen: Unremarkable.  Adrenals/Urinary Tract: 2.2 cm low-attenuation lesion in the interpolar region of the left kidney is compatible with a simple cyst. Sub cm low-attenuation lesion in the posterior aspect of the interpolar region of the right kidney is too small to definitively characterize, but also favored to represent a tiny cyst. Bilateral adrenal glands are normal in appearance. No hydroureteronephrosis. Urinary bladder is normal in appearance.  Stomach/Bowel: The appearance of the stomach is normal. No pathologic dilatation of small bowel or colon. Normal appendix.  Vascular/Lymphatic: Mild atherosclerosis throughout the abdominal and pelvic vasculature, without evidence of aneurysm or dissection. Celiac axis and major branches, superior mesenteric artery and major branches, and inferior mesenteric artery are all widely patent without hemodynamically significant stenosis. Single right renal artery widely patent. Three left renal arteries are widely patent. Common iliac arteries are widely patent bilaterally, with minimal diameter on the right have 8 mm. External iliac arteries bilaterally are widely patent and moderately tortuous with minimal diameter on the right of 9 mm. Common femoral arteries bilaterally are widely patent, with a minimal diameter of 8 mm on the left side. No lymphadenopathy noted in the abdomen or pelvis.  Reproductive: Prostate gland and  seminal vesicles are unremarkable in appearance.  Other: No significant volume of ascites. No pneumoperitoneum.  Musculoskeletal: There are no aggressive appearing lytic or blastic lesions noted in the visualized portions of the skeleton.  Review of the MIP images confirms the above findings.  IMPRESSION: 1. Mild atherosclerosis throughout the abdominal and pelvic vasculature. Although there is some moderate tortuosity of the external iliac arteries bilaterally, there are no areas of significant stenosis or other impediments to vascular access in the abdomen or pelvis in this patient. 2. Cholelithiasis without evidence of acute cholecystitis at this time. 3. Additional incidental findings, as above.   Electronically Signed  By: Trudie Reed M.D.  On: 04/30/2015 12:29    Impression:  Patient has chronic persistent H or fibrillation that has failed medical therapy on multiple agents and catheter-based ablation on 2 previous occasions, most recently in 2014. He presents with symptoms of exertional shortness of breath and fatigue consistent with chronic combined systolic and diastolic congestive heart failure, New York Heart Association functional class IIb. I have personally reviewed the patient's recent transesophageal echocardiogram, diagnostic cardiac catheterization, and CT angiograms. 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 Cardiac gated CT angiogram of the heart demonstrates bilateral atrial enlargement with normal pulmonary vein anatomy and no other significant structural abnormalities. CT angiogram of the aorta and iliac vessels demonstrates  adequate pelvic vascular access to facilitate femoral cannulation for surgery.   Plan:  I have again reviewed the indications, risks, and potential benefits of a surgical maze procedure with  the patient and his wife in the office today. Long-term treatment options have been discussed including continued medical therapy versus surgical intervention. Surgical alternatives abdomen discussed including a comparison with conventional sternotomy and minimally invasive techniques with or without use of cardiopulmonary bypass. Expectations for the patient's postoperative convalescence and long-term freedom from atrial dysrhythmias have been discussed at length.   They understand and accept all potential risks of surgery including but not limited to risk of death, stroke or other neurologic complication, myocardial infarction, congestive heart failure, respiratory failure, renal failure, bleeding requiring transfusion and/or reexploration, arrhythmia, infection or other wound complications, pneumonia, pleural and/or pericardial effusion, pulmonary embolus, aortic dissection or other major vascular complication, or delayed complications related to valve repair or replacement including but not limited to structural valve deterioration and failure, thrombosis, embolization, endocarditis, or paravalvular leak.  Alternative surgical approaches have been discussed including a comparison between conventional sternotomy and minimally-invasive techniques.  The relative risks and benefits of each have been reviewed as they pertain to the patient's specific circumstances, and all of their questions have been addressed.  Specific risks potentially related to the minimally-invasive approach were discussed at length, including but not limited to risk of conversion to full or partial sternotomy, aortic dissection or other major vascular complication, unilateral acute lung injury or pulmonary edema, phrenic nerve dysfunction or paralysis, rib fracture, chronic pain, lung hernia, or lymphocele.  All of their questions have been answered.  We tentatively plan to proceed with surgery on Thursday 05/28/2015. The patient will  return for follow-up on Monday, 05/25/2015. He has been instructed to stop taking Pradaxa 7 days prior to surgery.  I spent in excess of 30 minutes during the conduct of this office consultation and >50% of this time involved direct face-to-face encounter with the patient for counseling and/or coordination of their care.    Salvatore Decent. Cornelius Moras, MD 05/04/2015 4:26 PM

## 2015-05-04 NOTE — Patient Instructions (Signed)
Continue all previous medications without any changes at this time  Patient has been instructed to stop taking Pradaxa 7 days prior to your scheduled surgery  Patient should continue taking all other medications without change through the day before surgery.  Patient should have nothing to eat or drink after midnight the night before surgery.  On the morning of surgery patient should take only carvedilol (Coreg) with a sip of water.

## 2015-05-25 ENCOUNTER — Ambulatory Visit (HOSPITAL_COMMUNITY)
Admission: RE | Admit: 2015-05-25 | Discharge: 2015-05-25 | Disposition: A | Discharge status: Home / Self Care | Payer: Managed Care, Other (non HMO) | Source: Ambulatory Visit | Attending: Thoracic Surgery (Cardiothoracic Vascular Surgery) | Admitting: Thoracic Surgery (Cardiothoracic Vascular Surgery)

## 2015-05-25 ENCOUNTER — Encounter (HOSPITAL_COMMUNITY): Payer: Self-pay

## 2015-05-25 ENCOUNTER — Ambulatory Visit (INDEPENDENT_AMBULATORY_CARE_PROVIDER_SITE_OTHER): Payer: Managed Care, Other (non HMO) | Admitting: Thoracic Surgery (Cardiothoracic Vascular Surgery)

## 2015-05-25 ENCOUNTER — Encounter: Payer: Self-pay | Admitting: Thoracic Surgery (Cardiothoracic Vascular Surgery)

## 2015-05-25 ENCOUNTER — Encounter (HOSPITAL_COMMUNITY)
Admission: RE | Admit: 2015-05-25 | Discharge: 2015-05-25 | Disposition: A | Discharge status: Home / Self Care | Payer: Managed Care, Other (non HMO) | Source: Ambulatory Visit | Attending: Thoracic Surgery (Cardiothoracic Vascular Surgery) | Admitting: Thoracic Surgery (Cardiothoracic Vascular Surgery)

## 2015-05-25 VITALS — BP 130/71 | HR 37 | Temp 97.5°F | Resp 20 | Ht 67.0 in | Wt 211.4 lb

## 2015-05-25 VITALS — BP 116/66 | HR 42 | Resp 20 | Ht 67.0 in | Wt 213.0 lb

## 2015-05-25 DIAGNOSIS — R942 Abnormal results of pulmonary function studies: Secondary | ICD-10-CM | POA: Diagnosis not present

## 2015-05-25 DIAGNOSIS — I482 Chronic atrial fibrillation, unspecified: Secondary | ICD-10-CM

## 2015-05-25 DIAGNOSIS — I517 Cardiomegaly: Secondary | ICD-10-CM | POA: Insufficient documentation

## 2015-05-25 DIAGNOSIS — I4819 Other persistent atrial fibrillation: Secondary | ICD-10-CM

## 2015-05-25 DIAGNOSIS — Z01818 Encounter for other preprocedural examination: Secondary | ICD-10-CM | POA: Insufficient documentation

## 2015-05-25 DIAGNOSIS — I878 Other specified disorders of veins: Secondary | ICD-10-CM | POA: Insufficient documentation

## 2015-05-25 DIAGNOSIS — I481 Persistent atrial fibrillation: Secondary | ICD-10-CM | POA: Diagnosis not present

## 2015-05-25 DIAGNOSIS — I5022 Chronic systolic (congestive) heart failure: Secondary | ICD-10-CM | POA: Diagnosis not present

## 2015-05-25 DIAGNOSIS — J449 Chronic obstructive pulmonary disease, unspecified: Secondary | ICD-10-CM | POA: Diagnosis not present

## 2015-05-25 DIAGNOSIS — I4891 Unspecified atrial fibrillation: Secondary | ICD-10-CM

## 2015-05-25 DIAGNOSIS — R001 Bradycardia, unspecified: Secondary | ICD-10-CM | POA: Diagnosis not present

## 2015-05-25 DIAGNOSIS — R9431 Abnormal electrocardiogram [ECG] [EKG]: Secondary | ICD-10-CM | POA: Diagnosis not present

## 2015-05-25 HISTORY — DX: Cardiac arrhythmia, unspecified: I49.9

## 2015-05-25 HISTORY — DX: Unspecified osteoarthritis, unspecified site: M19.90

## 2015-05-25 HISTORY — DX: Reserved for inherently not codable concepts without codable children: IMO0001

## 2015-05-25 LAB — PULMONARY FUNCTION TEST
DL/VA % pred: 115 %
DL/VA: 5.14 ml/min/mmHg/L
DLCO unc % pred: 80 %
DLCO unc: 22.87 ml/min/mmHg
FEF 25-75 PRE: 1.81 L/s
FEF 25-75 Post: 2.3 L/sec
FEF2575-%Change-Post: 27 %
FEF2575-%PRED-PRE: 65 %
FEF2575-%Pred-Post: 83 %
FEV1-%Change-Post: 4 %
FEV1-%PRED-POST: 73 %
FEV1-%PRED-PRE: 70 %
FEV1-PRE: 2.31 L
FEV1-Post: 2.42 L
FEV1FVC-%Change-Post: 3 %
FEV1FVC-%Pred-Pre: 99 %
FEV6-%CHANGE-POST: 0 %
FEV6-%PRED-POST: 74 %
FEV6-%PRED-PRE: 74 %
FEV6-POST: 3.06 L
FEV6-Pre: 3.06 L
FEV6FVC-%Change-Post: 0 %
FEV6FVC-%PRED-POST: 104 %
FEV6FVC-%Pred-Pre: 105 %
FVC-%Change-Post: 0 %
FVC-%PRED-PRE: 70 %
FVC-%Pred-Post: 71 %
FVC-POST: 3.09 L
FVC-Pre: 3.06 L
POST FEV6/FVC RATIO: 99 %
PRE FEV1/FVC RATIO: 76 %
Post FEV1/FVC ratio: 78 %
Pre FEV6/FVC Ratio: 100 %
RV % pred: 86 %
RV: 1.79 L
TLC % PRED: 72 %
TLC: 4.6 L

## 2015-05-25 LAB — COMPREHENSIVE METABOLIC PANEL
ALK PHOS: 48 U/L (ref 38–126)
ALT: 25 U/L (ref 17–63)
AST: 22 U/L (ref 15–41)
Albumin: 3.6 g/dL (ref 3.5–5.0)
Anion gap: 6 (ref 5–15)
BILIRUBIN TOTAL: 0.8 mg/dL (ref 0.3–1.2)
BUN: 18 mg/dL (ref 6–20)
CO2: 27 mmol/L (ref 22–32)
Calcium: 9.1 mg/dL (ref 8.9–10.3)
Chloride: 108 mmol/L (ref 101–111)
Creatinine, Ser: 1.24 mg/dL (ref 0.61–1.24)
Glucose, Bld: 99 mg/dL (ref 65–99)
Potassium: 4.7 mmol/L (ref 3.5–5.1)
Sodium: 141 mmol/L (ref 135–145)
Total Protein: 6.7 g/dL (ref 6.5–8.1)

## 2015-05-25 LAB — CBC
HEMATOCRIT: 46.3 % (ref 39.0–52.0)
HEMOGLOBIN: 15.4 g/dL (ref 13.0–17.0)
MCH: 30.8 pg (ref 26.0–34.0)
MCHC: 33.3 g/dL (ref 30.0–36.0)
MCV: 92.6 fL (ref 78.0–100.0)
Platelets: 150 10*3/uL (ref 150–400)
RBC: 5 MIL/uL (ref 4.22–5.81)
RDW: 14.5 % (ref 11.5–15.5)
WBC: 6.7 10*3/uL (ref 4.0–10.5)

## 2015-05-25 LAB — TYPE AND SCREEN
ABO/RH(D): O POS
ANTIBODY SCREEN: NEGATIVE

## 2015-05-25 LAB — URINALYSIS, ROUTINE W REFLEX MICROSCOPIC
BILIRUBIN URINE: NEGATIVE
Glucose, UA: NEGATIVE mg/dL
Hgb urine dipstick: NEGATIVE
KETONES UR: NEGATIVE mg/dL
Leukocytes, UA: NEGATIVE
NITRITE: NEGATIVE
PH: 5.5 (ref 5.0–8.0)
Protein, ur: NEGATIVE mg/dL
Specific Gravity, Urine: 1.013 (ref 1.005–1.030)

## 2015-05-25 LAB — ABO/RH: ABO/RH(D): O POS

## 2015-05-25 LAB — APTT: APTT: 26 s (ref 24–37)

## 2015-05-25 LAB — SURGICAL PCR SCREEN
MRSA, PCR: NEGATIVE
STAPHYLOCOCCUS AUREUS: NEGATIVE

## 2015-05-25 LAB — PROTIME-INR
INR: 1.07 (ref 0.00–1.49)
PROTHROMBIN TIME: 14.1 s (ref 11.6–15.2)

## 2015-05-25 MED ORDER — ALBUTEROL SULFATE (2.5 MG/3ML) 0.083% IN NEBU
2.5000 mg | INHALATION_SOLUTION | Freq: Once | RESPIRATORY_TRACT | Status: AC
  Administered 2015-05-25: 2.5 mg via RESPIRATORY_TRACT

## 2015-05-25 NOTE — Pre-Procedure Instructions (Signed)
[name]  05/25/2015      [id number] [place], [place] - [address] [place] [address] Phone: [phone] Fax: [phone]    Your procedure is scheduled on  Thursday  05/28/15  Report to [facility] Admitting at 530 A.M.  Call this number if you have problems the morning of surgery:  [phone]   Remember:  Do not eat food or drink liquids after midnight.  Take these medicines the morning of surgery with A SIP OF WATER   AMIODARONE, CARVEDILOL (COREG)   (NO ASPIRIN, IBUPROFEN/ ADVIL/MOTRIN, GOODY POWDERS/BC'S, HERBAL MEDICINES)   Do not wear jewelry, make-up or nail polish.  Do not wear lotions, powders, or perfumes.  You may wear deodorant.  Do not shave 48 hours prior to surgery.  Men may shave face and neck.  Do not bring valuables to the hospital.  [facility] is not responsible for any belongings or valuables.  Contacts, dentures or bridgework may not be worn into surgery.  Leave your suitcase in the car.  After surgery it may be brought to your room.  For patients admitted to the hospital, discharge time will be determined by your treatment team.  Patients discharged the day of surgery will not be allowed to drive home.   Name and phone number of your driver:    Special instructions:  [place] - Preparing for Surgery  Before surgery, you can play an important role.  Because skin is not sterile, your skin needs to be as free of germs as possible.  You can reduce the number of germs on you skin by washing with CHG (chlorahexidine gluconate) soap before surgery.  CHG is an antiseptic cleaner which kills germs and bonds with the skin to continue killing germs even after washing.  Please DO NOT use if you have an allergy to CHG or antibacterial soaps.  If your skin becomes reddened/irritated stop using the CHG and inform your nurse when you arrive at Short Stay.  Do not shave (including legs and  underarms) for at least 48 hours prior to the first CHG shower.  You may shave your face.  Please follow these instructions carefully:   1.  Shower with CHG Soap the night before surgery and the                                morning of Surgery.  2.  If you choose to wash your hair, wash your hair first as usual with your       normal shampoo.  3.  After you shampoo, rinse your hair and body thoroughly to remove the                      Shampoo.  4.  Use CHG as you would any other liquid soap.  You can apply chg directly       to the skin and wash gently with scrungie or a clean washcloth.  5.  Apply the CHG Soap to your body ONLY FROM THE NECK DOWN.        Do not use on open wounds or open sores.  Avoid contact with your eyes,       ears, mouth and genitals (private parts).  Wash genitals (private parts)       with your normal soap.  6.  Wash thoroughly, paying special  attention to the area where your surgery        will be performed.  7.  Thoroughly rinse your body with warm water from the neck down.  8.  DO NOT shower/wash with your normal soap after using and rinsing off       the CHG Soap.  9.  Pat yourself dry with a clean towel.            10.  Wear clean pajamas.            11.  Place clean sheets on your bed the night of your first shower and do not        sleep with pets.  Day of Surgery  Do not apply any lotions/deoderants the morning of surgery.  Please wear clean clothes to the hospital/surgery center.    Please read over the following fact sheets that you were given. Pain Booklet, Coughing and Deep Breathing, Blood Transfusion Information, MRSA Information and Surgical Site Infection Prevention

## 2015-05-25 NOTE — Patient Instructions (Signed)
Patient has been instructed to stop taking amiodarone  Patient should continue taking all other medications without change through the day before surgery.  Patient should have nothing to eat or drink after midnight the night before surgery.  On the morning of surgery patient should take only carvedilol with a sip of water but do not take carvedilol if your resting pulse is < 40 beats/minute

## 2015-05-25 NOTE — Progress Notes (Signed)
   05/25/15 1015  OBSTRUCTIVE SLEEP APNEA  Have you ever been diagnosed with sleep apnea through a sleep study? No  Do you snore loudly (loud enough to be heard through closed doors)?  1  Do you often feel tired, fatigued, or sleepy during the daytime (such as falling asleep during driving or talking to someone)? 0  Has anyone observed you stop breathing during your sleep? 0  Do you have, or are you being treated for high blood pressure? 1  BMI more than 35 kg/m2? 0  Age > 50 (1-yes) 1  Neck circumference greater than:Male 16 inches or larger, Male 17inches or larger? 1  Male Gender (Yes=1) 1  Obstructive Sleep Apnea Score 5  Score 5 or greater  Results sent to PCP

## 2015-05-25 NOTE — Progress Notes (Signed)
VASCULAR LAB PRELIMINARY  PRELIMINARY  PRELIMINARY  PRELIMINARY  Pre-op Cardiac Surgery  Carotid Findings:  Right:  Occluded internal carotid artery.  Left:  1-39% ICA stenosis.  Bilateral:  Vertebral artery flow is antegrade.     Upper Extremity Right Left  Brachial Pressures 138  Triphasic 135  Triphasic   Radial Waveforms Triphasic  Triphasic   Ulnar Waveforms Triphasic  Triphasic   Palmar Arch (Allen's Test) Within normal limits  Within normal limits       Karalynn Cottone, RVT 05/25/2015, 11:43 AM

## 2015-05-25 NOTE — Progress Notes (Signed)
[address]       [name] [address]             [phone]     CARDIOTHORACIC SURGERY OFFICE NOTE  Referring Provider is Hillis Range, MD PCP is Sonda Primes, MD   HPI:  Patient returns to the office today with tentative plans to proceed with minimally invasive maze procedure later this week. He was last seen here in the office on 05/04/2015. He reports no new problems or complaints over the last few weeks. He continues to go in and out of atrial fibrillation. His resting pulse has been running a bit slower, typically between 40 and 55 bpm. He states that he still has periods where it goes faster while he is in atrial fibrillation.  He has not had any dizzy spells. He stopped taking Pradaxa in anticipation of surgery.   Current Outpatient Prescriptions  Medication Sig Dispense Refill  . amiodarone (PACERONE) 200 MG tablet Take 2 tablets (400 mg total) by mouth daily. 180 tablet 3  . carvedilol (COREG) 25 MG tablet Take 1.5 tablets (37.5 mg total) by mouth 2 (two) times daily with a meal. (Patient taking differently: Take 25 mg by mouth 2 (two) times daily with a meal. ) 90 tablet 3  . ramipril (ALTACE) 10 MG capsule TAKE 1 CAPSULE (10 MG TOTAL) BY MOUTH DAILY. 90 capsule 6  . dabigatran (PRADAXA) 150 MG CAPS capsule Take 150 mg by mouth 2 (two) times daily. Reported on 05/25/2015    . rosuvastatin (CRESTOR) 5 MG tablet Take 1 tablet (5 mg total) by mouth daily. (Patient not taking: Reported on 04/30/2015) 90 tablet 3   No current facility-administered medications for this visit.      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 This test has not yet been formally reviewed by vascular surgeon.   Impression:  Patient has chronic persistent atrial fibrillation that has failed medical therapy on multiple agents as well as catheter-based ablation on 2 previous occasions, most recently in 2014. The patient presents with symptoms of exertional shortness of breath and fatigue consistent with chronic combined systolic and diastolic congestive heart failure, New York Heart Association functional class II.  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 Options include continued medical therapy versus surgical Maze procedure for long-term management of the patient's atrial fibrillation. 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 There is no history of previous stroke or other symptoms to suggest symptomatic disease.    Plan:  We will make certain that the patient's carotid duplex scan is reviewed by a vascular surgeon. If there is any question as to whether or not the carotid artery is completely occluded versus severely stenotic then we will consider further diagnostic evaluation as deemed appropriate. Assuming this is not the case we will plan to  proceed with minimally invasive maze procedure as previously scheduled. I again reviewed the indications, risks, and potential benefits of surgery with the patient in the office today. All of his questions have been addressed. Because his resting pulse has been running so slow I have instructed the patient to stop taking amiodarone at this time but continue to take carvedilol as long as his resting pulse is greater than 40.  I spent in excess of 15  minutes during the conduct of this office consultation and >50% of this time involved direct face-to-face encounter with the patient for counseling and/or coordination of their care.   Salvatore Decent. Cornelius Moras, MD 05/25/2015 2:08 PM

## 2015-05-26 LAB — HEMOGLOBIN A1C
HEMOGLOBIN A1C: 5.9 % — AB (ref 4.8–5.6)
Mean Plasma Glucose: 123 mg/dL

## 2015-05-26 NOTE — Progress Notes (Signed)
Left message with Alycia Rossetti, RN regarding follow- up from [facility]

## 2015-05-27 MED ORDER — METOPROLOL TARTRATE 12.5 MG HALF TABLET: 12.5000 mg | ORAL_TABLET | Freq: Once | ORAL | Status: DC

## 2015-05-27 MED ORDER — SODIUM CHLORIDE 0.9 % IV SOLN
INTRAVENOUS | Status: DC
  Filled 2015-05-27: qty 30

## 2015-05-27 MED ORDER — DEXTROSE 5 % IV SOLN
1.5000 g | INTRAVENOUS | Status: AC
  Administered 2015-05-28: .3 g via INTRAVENOUS
  Filled 2015-05-27: qty 1.5

## 2015-05-27 MED ORDER — INSULIN REGULAR HUMAN 100 UNIT/ML IJ SOLN
INTRAMUSCULAR | Status: AC
  Administered 2015-05-28: .9 [IU]/h via INTRAVENOUS
  Filled 2015-05-27: qty 2.5

## 2015-05-27 MED ORDER — SODIUM CHLORIDE 0.9 % IV SOLN
INTRAVENOUS | Status: AC
  Administered 2015-05-28: 69.8 mL/h via INTRAVENOUS
  Filled 2015-05-27: qty 40

## 2015-05-27 MED ORDER — DEXMEDETOMIDINE HCL IN NACL 400 MCG/100ML IV SOLN
0.1000 ug/kg/h | INTRAVENOUS | Status: AC
  Administered 2015-05-28: .3 ug/kg/h via INTRAVENOUS
  Filled 2015-05-27: qty 100

## 2015-05-27 MED ORDER — VANCOMYCIN HCL 10 G IV SOLR
1500.0000 mg | INTRAVENOUS | Status: AC
  Administered 2015-05-28: 1500 mg via INTRAVENOUS
  Filled 2015-05-27: qty 1500

## 2015-05-27 MED ORDER — DEXTROSE 5 % IV SOLN
750.0000 mg | INTRAVENOUS | Status: DC
  Filled 2015-05-27: qty 750

## 2015-05-27 MED ORDER — MAGNESIUM SULFATE 50 % IJ SOLN
40.0000 meq | INTRAMUSCULAR | Status: DC
  Filled 2015-05-27: qty 10

## 2015-05-27 MED ORDER — NITROGLYCERIN IN D5W 200-5 MCG/ML-% IV SOLN
2.0000 ug/min | INTRAVENOUS | Status: AC
  Administered 2015-05-28: 10 ug/min via INTRAVENOUS
  Filled 2015-05-27: qty 250

## 2015-05-27 MED ORDER — EPINEPHRINE HCL 1 MG/ML IJ SOLN
0.0000 ug/min | INTRAMUSCULAR | Status: DC
  Filled 2015-05-27: qty 4

## 2015-05-27 MED ORDER — VANCOMYCIN HCL 1000 MG IV SOLR
INTRAVENOUS | Status: AC
  Administered 2015-05-28: 1000 mL
  Filled 2015-05-27: qty 1000

## 2015-05-27 MED ORDER — DOPAMINE-DEXTROSE 3.2-5 MG/ML-% IV SOLN
0.0000 ug/kg/min | INTRAVENOUS | Status: DC
  Filled 2015-05-27: qty 250

## 2015-05-27 MED ORDER — PLASMA-LYTE 148 IV SOLN
INTRAVENOUS | Status: DC
  Filled 2015-05-27: qty 2.5

## 2015-05-27 MED ORDER — CHLORHEXIDINE GLUCONATE 0.12 % MT SOLN
15.0000 mL | Freq: Once | OROMUCOSAL | Status: AC
  Administered 2015-05-28: 15 mL via OROMUCOSAL
  Filled 2015-05-27: qty 15

## 2015-05-27 MED ORDER — PHENYLEPHRINE HCL 10 MG/ML IJ SOLN
30.0000 ug/min | INTRAVENOUS | Status: DC
  Filled 2015-05-27: qty 2

## 2015-05-27 MED ORDER — POTASSIUM CHLORIDE 2 MEQ/ML IV SOLN
80.0000 meq | INTRAVENOUS | Status: DC
  Filled 2015-05-27: qty 40

## 2015-05-28 ENCOUNTER — Inpatient Hospital Stay (HOSPITAL_COMMUNITY): Payer: Managed Care, Other (non HMO) | Admitting: Vascular Surgery

## 2015-05-28 ENCOUNTER — Inpatient Hospital Stay (HOSPITAL_COMMUNITY): Payer: Managed Care, Other (non HMO)

## 2015-05-28 ENCOUNTER — Inpatient Hospital Stay (HOSPITAL_COMMUNITY)
Admission: RE | Admit: 2015-05-28 | Discharge: 2015-06-02 | DRG: 274 | Disposition: A | Discharge status: Home / Self Care | Payer: Managed Care, Other (non HMO) | Source: Ambulatory Visit | Attending: Thoracic Surgery (Cardiothoracic Vascular Surgery) | Admitting: Thoracic Surgery (Cardiothoracic Vascular Surgery)

## 2015-05-28 ENCOUNTER — Encounter (HOSPITAL_COMMUNITY)
Admission: RE | Disposition: A | Discharge status: Home / Self Care | Payer: Self-pay | Source: Ambulatory Visit | Attending: Thoracic Surgery (Cardiothoracic Vascular Surgery)

## 2015-05-28 ENCOUNTER — Encounter (HOSPITAL_COMMUNITY): Payer: Self-pay | Admitting: *Deleted

## 2015-05-28 DIAGNOSIS — Z6831 Body mass index (BMI) 31.0-31.9, adult: Secondary | ICD-10-CM | POA: Diagnosis not present

## 2015-05-28 DIAGNOSIS — I10 Essential (primary) hypertension: Secondary | ICD-10-CM | POA: Diagnosis present

## 2015-05-28 DIAGNOSIS — Z9889 Other specified postprocedural states: Secondary | ICD-10-CM

## 2015-05-28 DIAGNOSIS — Z8679 Personal history of other diseases of the circulatory system: Secondary | ICD-10-CM

## 2015-05-28 DIAGNOSIS — J9811 Atelectasis: Secondary | ICD-10-CM | POA: Diagnosis not present

## 2015-05-28 DIAGNOSIS — D62 Acute posthemorrhagic anemia: Secondary | ICD-10-CM | POA: Diagnosis not present

## 2015-05-28 DIAGNOSIS — I4819 Other persistent atrial fibrillation: Secondary | ICD-10-CM | POA: Diagnosis present

## 2015-05-28 DIAGNOSIS — I110 Hypertensive heart disease with heart failure: Secondary | ICD-10-CM | POA: Diagnosis present

## 2015-05-28 DIAGNOSIS — I4891 Unspecified atrial fibrillation: Secondary | ICD-10-CM | POA: Diagnosis present

## 2015-05-28 DIAGNOSIS — I5022 Chronic systolic (congestive) heart failure: Secondary | ICD-10-CM | POA: Diagnosis present

## 2015-05-28 DIAGNOSIS — E669 Obesity, unspecified: Secondary | ICD-10-CM | POA: Diagnosis present

## 2015-05-28 DIAGNOSIS — I2510 Atherosclerotic heart disease of native coronary artery without angina pectoris: Secondary | ICD-10-CM | POA: Diagnosis present

## 2015-05-28 DIAGNOSIS — I481 Persistent atrial fibrillation: Principal | ICD-10-CM | POA: Diagnosis present

## 2015-05-28 DIAGNOSIS — Z79899 Other long term (current) drug therapy: Secondary | ICD-10-CM | POA: Diagnosis not present

## 2015-05-28 DIAGNOSIS — I4892 Unspecified atrial flutter: Secondary | ICD-10-CM | POA: Diagnosis present

## 2015-05-28 DIAGNOSIS — R001 Bradycardia, unspecified: Secondary | ICD-10-CM | POA: Diagnosis present

## 2015-05-28 DIAGNOSIS — I420 Dilated cardiomyopathy: Secondary | ICD-10-CM | POA: Diagnosis present

## 2015-05-28 DIAGNOSIS — Z7901 Long term (current) use of anticoagulants: Secondary | ICD-10-CM

## 2015-05-28 DIAGNOSIS — I340 Nonrheumatic mitral (valve) insufficiency: Secondary | ICD-10-CM | POA: Diagnosis present

## 2015-05-28 DIAGNOSIS — M1990 Unspecified osteoarthritis, unspecified site: Secondary | ICD-10-CM | POA: Diagnosis present

## 2015-05-28 DIAGNOSIS — Z4682 Encounter for fitting and adjustment of non-vascular catheter: Secondary | ICD-10-CM

## 2015-05-28 DIAGNOSIS — I519 Heart disease, unspecified: Secondary | ICD-10-CM | POA: Diagnosis present

## 2015-05-28 DIAGNOSIS — G4733 Obstructive sleep apnea (adult) (pediatric): Secondary | ICD-10-CM | POA: Diagnosis present

## 2015-05-28 DIAGNOSIS — E872 Acidosis: Secondary | ICD-10-CM | POA: Diagnosis not present

## 2015-05-28 HISTORY — DX: Other specified postprocedural states: Z98.890

## 2015-05-28 HISTORY — PX: MINIMALLY INVASIVE MAZE PROCEDURE: SHX6244

## 2015-05-28 HISTORY — PX: TEE WITHOUT CARDIOVERSION: SHX5443

## 2015-05-28 HISTORY — DX: Personal history of other diseases of the circulatory system: Z86.79

## 2015-05-28 HISTORY — DX: Chronic diastolic (congestive) heart failure: I50.32

## 2015-05-28 LAB — POCT I-STAT, CHEM 8
BUN: 20 mg/dL (ref 6–20)
BUN: 15 mg/dL (ref 6–20)
BUN: 19 mg/dL (ref 6–20)
BUN: 19 mg/dL (ref 6–20)
BUN: 19 mg/dL (ref 6–20)
BUN: 18 mg/dL (ref 6–20)
BUN: 14 mg/dL (ref 6–20)
CALCIUM ION: 1.15 mmol/L (ref 1.12–1.23)
CALCIUM ION: 1.17 mmol/L (ref 1.12–1.23)
CALCIUM ION: 1.08 mmol/L — AB (ref 1.12–1.23)
CALCIUM ION: 1.13 mmol/L (ref 1.12–1.23)
CHLORIDE: 105 mmol/L (ref 101–111)
CHLORIDE: 102 mmol/L (ref 101–111)
CHLORIDE: 101 mmol/L (ref 101–111)
CHLORIDE: 105 mmol/L (ref 101–111)
CREATININE: 0.8 mg/dL (ref 0.61–1.24)
CREATININE: 0.8 mg/dL (ref 0.61–1.24)
CREATININE: 0.8 mg/dL (ref 0.61–1.24)
CREATININE: 0.8 mg/dL (ref 0.61–1.24)
Calcium, Ion: 1 mmol/L — ABNORMAL LOW (ref 1.12–1.23)
Calcium, Ion: 1.05 mmol/L — ABNORMAL LOW (ref 1.12–1.23)
Calcium, Ion: 1.06 mmol/L — ABNORMAL LOW (ref 1.12–1.23)
Chloride: 104 mmol/L (ref 101–111)
Chloride: 97 mmol/L — ABNORMAL LOW (ref 101–111)
Chloride: 102 mmol/L (ref 101–111)
Creatinine, Ser: 0.6 mg/dL — ABNORMAL LOW (ref 0.61–1.24)
Creatinine, Ser: 0.8 mg/dL (ref 0.61–1.24)
Creatinine, Ser: 0.9 mg/dL (ref 0.61–1.24)
GLUCOSE: 103 mg/dL — AB (ref 65–99)
GLUCOSE: 103 mg/dL — AB (ref 65–99)
GLUCOSE: 123 mg/dL — AB (ref 65–99)
GLUCOSE: 136 mg/dL — AB (ref 65–99)
Glucose, Bld: 87 mg/dL (ref 65–99)
Glucose, Bld: 124 mg/dL — ABNORMAL HIGH (ref 65–99)
Glucose, Bld: 111 mg/dL — ABNORMAL HIGH (ref 65–99)
HCT: 43 % (ref 39.0–52.0)
HCT: 34 % — ABNORMAL LOW (ref 39.0–52.0)
HCT: 34 % — ABNORMAL LOW (ref 39.0–52.0)
HCT: 44 % (ref 39.0–52.0)
HEMATOCRIT: 33 % — AB (ref 39.0–52.0)
HEMATOCRIT: 41 % (ref 39.0–52.0)
HEMATOCRIT: 35 % — AB (ref 39.0–52.0)
HEMOGLOBIN: 11.2 g/dL — AB (ref 13.0–17.0)
HEMOGLOBIN: 13.9 g/dL (ref 13.0–17.0)
HEMOGLOBIN: 11.9 g/dL — AB (ref 13.0–17.0)
Hemoglobin: 14.6 g/dL (ref 13.0–17.0)
Hemoglobin: 11.6 g/dL — ABNORMAL LOW (ref 13.0–17.0)
Hemoglobin: 11.6 g/dL — ABNORMAL LOW (ref 13.0–17.0)
Hemoglobin: 15 g/dL (ref 13.0–17.0)
POTASSIUM: 4.3 mmol/L (ref 3.5–5.1)
POTASSIUM: 4.9 mmol/L (ref 3.5–5.1)
POTASSIUM: 4.7 mmol/L (ref 3.5–5.1)
POTASSIUM: 4.4 mmol/L (ref 3.5–5.1)
Potassium: 4.3 mmol/L (ref 3.5–5.1)
Potassium: 4.3 mmol/L (ref 3.5–5.1)
Potassium: 4.2 mmol/L (ref 3.5–5.1)
SODIUM: 141 mmol/L (ref 135–145)
SODIUM: 139 mmol/L (ref 135–145)
Sodium: 134 mmol/L — ABNORMAL LOW (ref 135–145)
Sodium: 139 mmol/L (ref 135–145)
Sodium: 136 mmol/L (ref 135–145)
Sodium: 137 mmol/L (ref 135–145)
Sodium: 139 mmol/L (ref 135–145)
TCO2: 27 mmol/L (ref 0–100)
TCO2: 26 mmol/L (ref 0–100)
TCO2: 27 mmol/L (ref 0–100)
TCO2: 28 mmol/L (ref 0–100)
TCO2: 28 mmol/L (ref 0–100)
TCO2: 25 mmol/L (ref 0–100)
TCO2: 24 mmol/L (ref 0–100)

## 2015-05-28 LAB — POCT I-STAT 4, (NA,K, GLUC, HGB,HCT)
Glucose, Bld: 99 mg/dL (ref 65–99)
HEMATOCRIT: 40 % (ref 39.0–52.0)
Hemoglobin: 13.6 g/dL (ref 13.0–17.0)
Potassium: 4.1 mmol/L (ref 3.5–5.1)
SODIUM: 140 mmol/L (ref 135–145)

## 2015-05-28 LAB — POCT I-STAT 3, ART BLOOD GAS (G3+)
ACID-BASE DEFICIT: 1 mmol/L (ref 0.0–2.0)
ACID-BASE DEFICIT: 3 mmol/L — AB (ref 0.0–2.0)
ACID-BASE DEFICIT: 3 mmol/L — AB (ref 0.0–2.0)
Acid-base deficit: 2 mmol/L (ref 0.0–2.0)
Acid-base deficit: 5 mmol/L — ABNORMAL HIGH (ref 0.0–2.0)
Acid-base deficit: 4 mmol/L — ABNORMAL HIGH (ref 0.0–2.0)
BICARBONATE: 23.7 meq/L (ref 20.0–24.0)
BICARBONATE: 25.4 meq/L — AB (ref 20.0–24.0)
BICARBONATE: 25.2 meq/L — AB (ref 20.0–24.0)
BICARBONATE: 23 meq/L (ref 20.0–24.0)
BICARBONATE: 23.2 meq/L (ref 20.0–24.0)
BICARBONATE: 23.6 meq/L (ref 20.0–24.0)
Bicarbonate: 23.2 mEq/L (ref 20.0–24.0)
O2 SAT: 98 %
O2 SAT: 97 %
O2 SAT: 99 %
O2 SAT: 97 %
O2 Saturation: 100 %
O2 Saturation: 96 %
O2 Saturation: 99 %
PCO2 ART: 41.3 mmHg (ref 35.0–45.0)
PCO2 ART: 45 mmHg (ref 35.0–45.0)
PH ART: 7.367 (ref 7.350–7.450)
PH ART: 7.26 — AB (ref 7.350–7.450)
PO2 ART: 401 mmHg — AB (ref 80.0–100.0)
PO2 ART: 117 mmHg — AB (ref 80.0–100.0)
PO2 ART: 91 mmHg (ref 80.0–100.0)
PO2 ART: 136 mmHg — AB (ref 80.0–100.0)
Patient temperature: 97.7
Patient temperature: 37.2
Patient temperature: 37.3
TCO2: 25 mmol/L (ref 0–100)
TCO2: 27 mmol/L (ref 0–100)
TCO2: 27 mmol/L (ref 0–100)
TCO2: 25 mmol/L (ref 0–100)
TCO2: 25 mmol/L (ref 0–100)
TCO2: 25 mmol/L (ref 0–100)
TCO2: 25 mmol/L (ref 0–100)
pCO2 arterial: 48.3 mmHg — ABNORMAL HIGH (ref 35.0–45.0)
pCO2 arterial: 51.4 mmHg — ABNORMAL HIGH (ref 35.0–45.0)
pCO2 arterial: 45.2 mmHg — ABNORMAL HIGH (ref 35.0–45.0)
pCO2 arterial: 49.5 mmHg — ABNORMAL HIGH (ref 35.0–45.0)
pCO2 arterial: 48 mmHg — ABNORMAL HIGH (ref 35.0–45.0)
pH, Arterial: 7.359 (ref 7.350–7.450)
pH, Arterial: 7.324 — ABNORMAL LOW (ref 7.350–7.450)
pH, Arterial: 7.32 — ABNORMAL LOW (ref 7.350–7.450)
pH, Arterial: 7.28 — ABNORMAL LOW (ref 7.350–7.450)
pH, Arterial: 7.301 — ABNORMAL LOW (ref 7.350–7.450)
pO2, Arterial: 103 mmHg — ABNORMAL HIGH (ref 80.0–100.0)
pO2, Arterial: 110 mmHg — ABNORMAL HIGH (ref 80.0–100.0)
pO2, Arterial: 130 mmHg — ABNORMAL HIGH (ref 80.0–100.0)

## 2015-05-28 LAB — CBC
HCT: 42.2 % (ref 39.0–52.0)
HEMATOCRIT: 40.6 % (ref 39.0–52.0)
HEMOGLOBIN: 13.3 g/dL (ref 13.0–17.0)
Hemoglobin: 14 g/dL (ref 13.0–17.0)
MCH: 29.8 pg (ref 26.0–34.0)
MCH: 29.9 pg (ref 26.0–34.0)
MCHC: 32.8 g/dL (ref 30.0–36.0)
MCHC: 33.2 g/dL (ref 30.0–36.0)
MCV: 91 fL (ref 78.0–100.0)
MCV: 90.2 fL (ref 78.0–100.0)
PLATELETS: 111 10*3/uL — AB (ref 150–400)
Platelets: 78 10*3/uL — ABNORMAL LOW (ref 150–400)
RBC: 4.46 MIL/uL (ref 4.22–5.81)
RBC: 4.68 MIL/uL (ref 4.22–5.81)
RDW: 14.3 % (ref 11.5–15.5)
RDW: 14.3 % (ref 11.5–15.5)
WBC: 7.9 10*3/uL (ref 4.0–10.5)
WBC: 15.5 10*3/uL — ABNORMAL HIGH (ref 4.0–10.5)

## 2015-05-28 LAB — PLATELET COUNT: Platelets: 134 10*3/uL — ABNORMAL LOW (ref 150–400)

## 2015-05-28 LAB — GLUCOSE, CAPILLARY: Glucose-Capillary: 122 mg/dL — ABNORMAL HIGH (ref 65–99)

## 2015-05-28 LAB — BLOOD GAS, ARTERIAL
Acid-Base Excess: 0.3 mmol/L (ref 0.0–2.0)
Bicarbonate: 24.5 mEq/L — ABNORMAL HIGH (ref 20.0–24.0)
Drawn by: 449841
FIO2: 0.21
O2 Saturation: 96.3 %
PCO2 ART: 40.1 mmHg (ref 35.0–45.0)
PH ART: 7.402 (ref 7.350–7.450)
Patient temperature: 98.6
TCO2: 25.7 mmol/L (ref 0–100)
pO2, Arterial: 86.4 mmHg (ref 80.0–100.0)

## 2015-05-28 LAB — APTT: aPTT: 30 seconds (ref 24–37)

## 2015-05-28 LAB — MAGNESIUM: Magnesium: 2.9 mg/dL — ABNORMAL HIGH (ref 1.7–2.4)

## 2015-05-28 LAB — CREATININE, SERUM
Creatinine, Ser: 1.07 mg/dL (ref 0.61–1.24)
GFR calc non Af Amer: >60 mL/min (ref >60)

## 2015-05-28 LAB — HEMOGLOBIN AND HEMATOCRIT, BLOOD
HCT: 35.5 % — ABNORMAL LOW (ref 39.0–52.0)
HEMOGLOBIN: 12.1 g/dL — AB (ref 13.0–17.0)

## 2015-05-28 LAB — PROTIME-INR
INR: 1.37 (ref 0.00–1.49)
Prothrombin Time: 17 seconds — ABNORMAL HIGH (ref 11.6–15.2)

## 2015-05-28 SURGERY — MAZE PROCEDURE, CARDIAC, MINIMALLY INVASIVE
Anesthesia: General

## 2015-05-28 MED ORDER — METOPROLOL TARTRATE 1 MG/ML IV SOLN: 2.5000 mg | INTRAVENOUS | Status: DC | PRN

## 2015-05-28 MED ORDER — PROPOFOL 10 MG/ML IV BOLUS
INTRAVENOUS | Status: AC
  Filled 2015-05-28: qty 20

## 2015-05-28 MED ORDER — ASPIRIN 81 MG PO CHEW: 324.0000 mg | CHEWABLE_TABLET | Freq: Every day | ORAL | Status: DC

## 2015-05-28 MED ORDER — MIDAZOLAM HCL 2 MG/2ML IJ SOLN: 2.0000 mg | INTRAMUSCULAR | Status: DC | PRN

## 2015-05-28 MED ORDER — MORPHINE SULFATE (PF) 2 MG/ML IV SOLN: 1.0000 mg | INTRAVENOUS | Status: DC | PRN

## 2015-05-28 MED ORDER — NITROPRUSSIDE SODIUM 25 MG/ML IV SOLN
0.0000 ug/kg/min | INTRAVENOUS | Status: DC
  Administered 2015-05-28: 0.3 ug/kg/min via INTRAVENOUS
  Filled 2015-05-28 (×2): qty 2

## 2015-05-28 MED ORDER — PROPOFOL 10 MG/ML IV BOLUS
INTRAVENOUS | Status: DC | PRN
  Administered 2015-05-28: 80 mg via INTRAVENOUS
  Administered 2015-05-28: 40 mg via INTRAVENOUS

## 2015-05-28 MED ORDER — ALBUMIN HUMAN 5 % IV SOLN: 250.0000 mL | INTRAVENOUS | Status: AC | PRN

## 2015-05-28 MED ORDER — ASPIRIN EC 325 MG PO TBEC
325.0000 mg | DELAYED_RELEASE_TABLET | Freq: Every day | ORAL | Status: DC
  Administered 2015-05-29 – 2015-06-02 (×5): 325 mg via ORAL
  Filled 2015-05-28 (×5): qty 1

## 2015-05-28 MED ORDER — SODIUM CHLORIDE 0.45 % IV SOLN
INTRAVENOUS | Status: DC | PRN
  Administered 2015-05-28: 14:00:00 via INTRAVENOUS

## 2015-05-28 MED ORDER — ALBUMIN HUMAN 5 % IV SOLN
INTRAVENOUS | Status: DC | PRN
  Administered 2015-05-28: 13:00:00 via INTRAVENOUS

## 2015-05-28 MED ORDER — METOPROLOL TARTRATE 12.5 MG HALF TABLET: 12.5000 mg | ORAL_TABLET | Freq: Two times a day (BID) | ORAL | Status: DC

## 2015-05-28 MED ORDER — MAGNESIUM SULFATE 4 GM/100ML IV SOLN
4.0000 g | Freq: Once | INTRAVENOUS | Status: AC
  Administered 2015-05-28: 4 g via INTRAVENOUS
  Filled 2015-05-28: qty 100

## 2015-05-28 MED ORDER — ARTIFICIAL TEARS OP OINT
TOPICAL_OINTMENT | OPHTHALMIC | Status: DC | PRN
  Administered 2015-05-28: 1 via OPHTHALMIC

## 2015-05-28 MED ORDER — ACETAMINOPHEN 500 MG PO TABS
1000.0000 mg | ORAL_TABLET | Freq: Four times a day (QID) | ORAL | Status: DC
  Administered 2015-05-29 – 2015-06-02 (×15): 1000 mg via ORAL
  Filled 2015-05-28 (×16): qty 2

## 2015-05-28 MED ORDER — ACETAMINOPHEN 650 MG RE SUPP
650.0000 mg | Freq: Once | RECTAL | Status: AC
  Administered 2015-05-28: 650 mg via RECTAL

## 2015-05-28 MED ORDER — ROCURONIUM BROMIDE 100 MG/10ML IV SOLN
INTRAVENOUS | Status: DC | PRN
  Administered 2015-05-28: 50 mg via INTRAVENOUS

## 2015-05-28 MED ORDER — SODIUM CHLORIDE 0.9 % IV SOLN: INTRAVENOUS | Status: DC

## 2015-05-28 MED ORDER — GLYCOPYRROLATE 0.2 MG/ML IJ SOLN
INTRAMUSCULAR | Status: DC | PRN
  Administered 2015-05-28: 0.2 mg via INTRAVENOUS

## 2015-05-28 MED ORDER — BISACODYL 5 MG PO TBEC
10.0000 mg | DELAYED_RELEASE_TABLET | Freq: Every day | ORAL | Status: DC
Start: 2015-05-29 — End: 2015-06-02
  Administered 2015-05-29 – 2015-06-02 (×5): 10 mg via ORAL
  Filled 2015-05-28 (×5): qty 2

## 2015-05-28 MED ORDER — PANTOPRAZOLE SODIUM 40 MG PO TBEC
40.0000 mg | DELAYED_RELEASE_TABLET | Freq: Every day | ORAL | Status: DC
  Administered 2015-05-30 – 2015-06-02 (×4): 40 mg via ORAL
  Filled 2015-05-28 (×4): qty 1

## 2015-05-28 MED ORDER — ESMOLOL HCL 100 MG/10ML IV SOLN
INTRAVENOUS | Status: AC
  Filled 2015-05-28: qty 10

## 2015-05-28 MED ORDER — ONDANSETRON HCL 4 MG/2ML IJ SOLN
4.0000 mg | Freq: Four times a day (QID) | INTRAMUSCULAR | Status: DC | PRN
Start: 2015-05-28 — End: 2015-06-02
  Administered 2015-05-29: 4 mg via INTRAVENOUS
  Filled 2015-05-28: qty 2

## 2015-05-28 MED ORDER — INSULIN ASPART 100 UNIT/ML ~~LOC~~ SOLN
0.0000 [IU] | SUBCUTANEOUS | Status: DC
  Administered 2015-05-28 – 2015-05-29 (×2): 2 [IU] via SUBCUTANEOUS

## 2015-05-28 MED ORDER — FENTANYL CITRATE (PF) 250 MCG/5ML IJ SOLN
INTRAMUSCULAR | Status: AC
  Filled 2015-05-28: qty 5

## 2015-05-28 MED ORDER — CHLORHEXIDINE GLUCONATE 0.12% ORAL RINSE (MEDLINE KIT)
15.0000 mL | Freq: Two times a day (BID) | OROMUCOSAL | Status: DC
  Administered 2015-05-28 – 2015-06-02 (×8): 15 mL via OROMUCOSAL

## 2015-05-28 MED ORDER — CHLORHEXIDINE GLUCONATE 4 % EX LIQD: 30.0000 mL | CUTANEOUS | Status: DC

## 2015-05-28 MED ORDER — HYDRALAZINE HCL 20 MG/ML IJ SOLN
10.0000 mg | Freq: Once | INTRAMUSCULAR | Status: AC
  Administered 2015-05-28: 10 mg via INTRAVENOUS

## 2015-05-28 MED ORDER — FENTANYL CITRATE (PF) 250 MCG/5ML IJ SOLN
INTRAMUSCULAR | Status: AC
  Filled 2015-05-28: qty 10

## 2015-05-28 MED ORDER — MIDAZOLAM HCL 5 MG/5ML IJ SOLN
INTRAMUSCULAR | Status: DC | PRN
  Administered 2015-05-28: 1 mg via INTRAVENOUS
  Administered 2015-05-28 (×3): 2 mg via INTRAVENOUS

## 2015-05-28 MED ORDER — SODIUM CHLORIDE 0.9 % IV SOLN: 250.0000 mL | INTRAVENOUS | Status: DC

## 2015-05-28 MED ORDER — FAMOTIDINE IN NACL 20-0.9 MG/50ML-% IV SOLN
20.0000 mg | Freq: Two times a day (BID) | INTRAVENOUS | Status: DC
  Administered 2015-05-28: 20 mg via INTRAVENOUS

## 2015-05-28 MED ORDER — FENTANYL CITRATE (PF) 100 MCG/2ML IJ SOLN
INTRAMUSCULAR | Status: DC | PRN
  Administered 2015-05-28: 150 ug via INTRAVENOUS
  Administered 2015-05-28: 100 ug via INTRAVENOUS
  Administered 2015-05-28: 250 ug via INTRAVENOUS
  Administered 2015-05-28: 300 ug via INTRAVENOUS
  Administered 2015-05-28 (×2): 150 ug via INTRAVENOUS
  Administered 2015-05-28: 50 ug via INTRAVENOUS
  Administered 2015-05-28: 150 ug via INTRAVENOUS
  Administered 2015-05-28 (×3): 100 ug via INTRAVENOUS
  Administered 2015-05-28: 150 ug via INTRAVENOUS
  Administered 2015-05-28: 100 ug via INTRAVENOUS
  Administered 2015-05-28: 150 ug via INTRAVENOUS

## 2015-05-28 MED ORDER — LACTATED RINGERS IV SOLN: INTRAVENOUS | Status: DC

## 2015-05-28 MED ORDER — DEXMEDETOMIDINE HCL IN NACL 200 MCG/50ML IV SOLN
0.0000 ug/kg/h | INTRAVENOUS | Status: DC
  Filled 2015-05-28: qty 50

## 2015-05-28 MED ORDER — LACTATED RINGERS IV SOLN
INTRAVENOUS | Status: DC | PRN
  Administered 2015-05-28 (×2): via INTRAVENOUS

## 2015-05-28 MED ORDER — BISACODYL 10 MG RE SUPP: 10.0000 mg | Freq: Every day | RECTAL | Status: DC

## 2015-05-28 MED ORDER — SODIUM CHLORIDE 0.9% FLUSH
3.0000 mL | Freq: Two times a day (BID) | INTRAVENOUS | Status: DC
Start: 2015-05-29 — End: 2015-06-02
  Administered 2015-05-31 – 2015-06-01 (×2): 3 mL via INTRAVENOUS

## 2015-05-28 MED ORDER — SODIUM CHLORIDE 0.9% FLUSH: 3.0000 mL | INTRAVENOUS | Status: DC | PRN

## 2015-05-28 MED ORDER — TRAMADOL HCL 50 MG PO TABS
50.0000 mg | ORAL_TABLET | ORAL | Status: DC | PRN
  Administered 2015-05-31: 50 mg via ORAL
  Filled 2015-05-28: qty 1

## 2015-05-28 MED ORDER — ANTISEPTIC ORAL RINSE SOLUTION (CORINZ)
7.0000 mL | OROMUCOSAL | Status: DC
  Administered 2015-05-28 – 2015-05-29 (×8): 7 mL via OROMUCOSAL

## 2015-05-28 MED ORDER — POTASSIUM CHLORIDE 10 MEQ/50ML IV SOLN: 10.0000 meq | INTRAVENOUS | Status: AC

## 2015-05-28 MED ORDER — DIPHENHYDRAMINE HCL 50 MG/ML IJ SOLN
INTRAMUSCULAR | Status: DC | PRN
  Administered 2015-05-28 (×2): 25 mg via INTRAVENOUS

## 2015-05-28 MED ORDER — LIDOCAINE HCL (CARDIAC) 20 MG/ML IV SOLN
INTRAVENOUS | Status: DC | PRN
  Administered 2015-05-28: 80 mg via INTRAVENOUS

## 2015-05-28 MED ORDER — MIDAZOLAM HCL 10 MG/2ML IJ SOLN
INTRAMUSCULAR | Status: AC
  Filled 2015-05-28: qty 2

## 2015-05-28 MED ORDER — SODIUM CHLORIDE 0.9 % IJ SOLN
INTRAMUSCULAR | Status: AC
  Filled 2015-05-28: qty 10

## 2015-05-28 MED ORDER — HYDRALAZINE HCL 20 MG/ML IJ SOLN
INTRAMUSCULAR | Status: AC
  Filled 2015-05-28: qty 1

## 2015-05-28 MED ORDER — LACTATED RINGERS IV SOLN
INTRAVENOUS | Status: DC | PRN
  Administered 2015-05-28 (×2): via INTRAVENOUS

## 2015-05-28 MED ORDER — SODIUM CHLORIDE 0.9 % IV SOLN
INTRAVENOUS | Status: DC
  Administered 2015-05-28: 14:00:00 via INTRAVENOUS

## 2015-05-28 MED ORDER — LEVOFLOXACIN IN D5W 750 MG/150ML IV SOLN
750.0000 mg | INTRAVENOUS | Status: AC
  Administered 2015-05-28: 750 mg via INTRAVENOUS
  Filled 2015-05-28: qty 150

## 2015-05-28 MED ORDER — ESMOLOL HCL 100 MG/10ML IV SOLN
INTRAVENOUS | Status: DC | PRN
  Administered 2015-05-28: 100 mg via INTRAVENOUS

## 2015-05-28 MED ORDER — DOCUSATE SODIUM 100 MG PO CAPS
200.0000 mg | ORAL_CAPSULE | Freq: Every day | ORAL | Status: DC
  Administered 2015-05-29 – 2015-06-02 (×5): 200 mg via ORAL
  Filled 2015-05-28 (×5): qty 2

## 2015-05-28 MED ORDER — SODIUM CHLORIDE 0.9 % IR SOLN
Status: DC | PRN
Start: 2015-05-28 — End: 2015-05-28
  Administered 2015-05-28: 3000 mL

## 2015-05-28 MED ORDER — LACTATED RINGERS IV SOLN
INTRAVENOUS | Status: DC | PRN
  Administered 2015-05-28: 08:00:00 via INTRAVENOUS

## 2015-05-28 MED ORDER — ACETAMINOPHEN 160 MG/5ML PO SOLN
1000.0000 mg | Freq: Four times a day (QID) | ORAL | Status: DC
Start: 2015-05-29 — End: 2015-05-29

## 2015-05-28 MED ORDER — CHLORHEXIDINE GLUCONATE 0.12 % MT SOLN
15.0000 mL | OROMUCOSAL | Status: AC
  Administered 2015-05-28: 15 mL via OROMUCOSAL

## 2015-05-28 MED ORDER — PHENYLEPHRINE HCL 10 MG/ML IJ SOLN
10.0000 mg | INTRAVENOUS | Status: DC | PRN
  Administered 2015-05-28: 25 ug/min via INTRAVENOUS

## 2015-05-28 MED ORDER — ACETAMINOPHEN 160 MG/5ML PO SOLN: 650.0000 mg | Freq: Once | ORAL | Status: AC

## 2015-05-28 MED ORDER — PROTAMINE SULFATE 10 MG/ML IV SOLN
INTRAVENOUS | Status: DC | PRN
  Administered 2015-05-28: 10 mg via INTRAVENOUS
  Administered 2015-05-28: 240 mg via INTRAVENOUS

## 2015-05-28 MED ORDER — HEPARIN SODIUM (PORCINE) 1000 UNIT/ML IJ SOLN
INTRAMUSCULAR | Status: DC | PRN
  Administered 2015-05-28: 27000 [IU] via INTRAVENOUS

## 2015-05-28 MED ORDER — METOPROLOL TARTRATE 25 MG/10 ML ORAL SUSPENSION: 12.5000 mg | Freq: Two times a day (BID) | ORAL | Status: DC

## 2015-05-28 MED ORDER — INSULIN REGULAR BOLUS VIA INFUSION
0.0000 [IU] | Freq: Three times a day (TID) | INTRAVENOUS | Status: DC
  Filled 2015-05-28: qty 10

## 2015-05-28 MED ORDER — ARTIFICIAL TEARS OP OINT
TOPICAL_OINTMENT | OPHTHALMIC | Status: AC
  Filled 2015-05-28: qty 3.5

## 2015-05-28 MED ORDER — NITROGLYCERIN 0.2 MG/ML ON CALL CATH LAB
INTRAVENOUS | Status: DC | PRN
  Administered 2015-05-28: 80 ug via INTRAVENOUS

## 2015-05-28 MED ORDER — METOPROLOL TARTRATE 1 MG/ML IV SOLN
INTRAVENOUS | Status: DC | PRN
  Administered 2015-05-28 (×2): 2.5 mg via INTRAVENOUS

## 2015-05-28 MED ORDER — NITROGLYCERIN IN D5W 200-5 MCG/ML-% IV SOLN: 0.0000 ug/min | INTRAVENOUS | Status: DC

## 2015-05-28 MED ORDER — LIDOCAINE HCL (CARDIAC) 20 MG/ML IV SOLN
INTRAVENOUS | Status: AC
  Filled 2015-05-28: qty 5

## 2015-05-28 MED ORDER — LEVOFLOXACIN IN D5W 750 MG/150ML IV SOLN
750.0000 mg | INTRAVENOUS | Status: AC
  Administered 2015-05-29: 750 mg via INTRAVENOUS
  Filled 2015-05-28: qty 150

## 2015-05-28 MED ORDER — LACTATED RINGERS IV SOLN: 500.0000 mL | Freq: Once | INTRAVENOUS | Status: DC | PRN

## 2015-05-28 MED ORDER — OXYCODONE HCL 5 MG PO TABS
5.0000 mg | ORAL_TABLET | ORAL | Status: DC | PRN
  Administered 2015-05-29 – 2015-06-01 (×7): 10 mg via ORAL
  Filled 2015-05-28 (×7): qty 2

## 2015-05-28 MED ORDER — VECURONIUM BROMIDE 10 MG IV SOLR
INTRAVENOUS | Status: DC | PRN
  Administered 2015-05-28: 2 mg via INTRAVENOUS
  Administered 2015-05-28: 5 mg via INTRAVENOUS
  Administered 2015-05-28: 3 mg via INTRAVENOUS
  Administered 2015-05-28 (×2): 5 mg via INTRAVENOUS

## 2015-05-28 MED ORDER — VANCOMYCIN HCL IN DEXTROSE 1-5 GM/200ML-% IV SOLN
1000.0000 mg | Freq: Once | INTRAVENOUS | Status: AC
  Administered 2015-05-28: 1000 mg via INTRAVENOUS
  Filled 2015-05-28: qty 200

## 2015-05-28 MED ORDER — PHENYLEPHRINE HCL 10 MG/ML IJ SOLN: 0.0000 ug/min | INTRAVENOUS | Status: DC

## 2015-05-28 MED ORDER — MORPHINE SULFATE (PF) 2 MG/ML IV SOLN
2.0000 mg | INTRAVENOUS | Status: DC | PRN
  Administered 2015-05-28 – 2015-05-29 (×5): 2 mg via INTRAVENOUS
  Filled 2015-05-28 (×5): qty 1

## 2015-05-28 MED FILL — Heparin Sodium (Porcine) Inj 1000 Unit/ML: INTRAMUSCULAR | Qty: 30 | Status: AC

## 2015-05-28 MED FILL — Magnesium Sulfate Inj 50%: INTRAMUSCULAR | Qty: 10 | Status: AC

## 2015-05-28 MED FILL — Potassium Chloride Inj 2 mEq/ML: INTRAVENOUS | Qty: 40 | Status: AC

## 2015-05-28 SURGICAL SUPPLY — 117 items
ADAPTER CARDIO PERF ANTE/RETRO (ADAPTER) ×2 IMPLANT
ADH SKN CLS APL DERMABOND .7 (GAUZE/BANDAGES/DRESSINGS) ×2
ADPR PRFSN 84XANTGRD RTRGD (ADAPTER) ×1
APPLIER CLIP 5 13 M/L LIGAMAX5 (MISCELLANEOUS) ×2
APR CLP MED LRG 5 ANG JAW (MISCELLANEOUS) ×1
BAG DECANTER FOR FLEXI CONT (MISCELLANEOUS) ×2 IMPLANT
BLADE SURG 11 STRL SS (BLADE) ×2 IMPLANT
CANISTER SUCTION 2500CC (MISCELLANEOUS) ×4 IMPLANT
CANNULA FEM VENOUS REMOTE 22FR (CANNULA) ×1 IMPLANT
CANNULA FEMORAL ART 14 SM (MISCELLANEOUS) ×3 IMPLANT
CANNULA GUNDRY RCSP 15FR (MISCELLANEOUS) ×2 IMPLANT
CANNULA OPTISITE PERFUSION 16F (CANNULA) IMPLANT
CANNULA OPTISITE PERFUSION 18F (CANNULA) ×1 IMPLANT
CANNULA SUMP PERICARDIAL (CANNULA) ×4 IMPLANT
CARDIOBLATE CARDIAC ABLATION (MISCELLANEOUS)
CLAMP OLL ABLATION (MISCELLANEOUS) ×1 IMPLANT
CLIP APPLIE 5 13 M/L LIGAMAX5 (MISCELLANEOUS) IMPLANT
CONN ST 1/4X3/8  BEN (MISCELLANEOUS) ×4
CONN ST 1/4X3/8 BEN (MISCELLANEOUS) ×2 IMPLANT
CONNECTOR 1/2X3/8X1/2 3 WAY (MISCELLANEOUS) ×2
CONNECTOR 1/2X3/8X1/2 3WAY (MISCELLANEOUS) ×1 IMPLANT
COVER BACK TABLE 24X17X13 BIG (DRAPES) ×2 IMPLANT
COVER PROBE W GEL 5X96 (DRAPES) ×1 IMPLANT
CRADLE DONUT ADULT HEAD (MISCELLANEOUS) ×2 IMPLANT
DERMABOND ADVANCED (GAUZE/BANDAGES/DRESSINGS) ×2
DERMABOND ADVANCED .7 DNX12 (GAUZE/BANDAGES/DRESSINGS) ×2 IMPLANT
DEVICE CARDIOBLATE CARDIAC ABL (MISCELLANEOUS) IMPLANT
DEVICE TROCAR PUNCTURE CLOSURE (ENDOMECHANICALS) ×2 IMPLANT
DRAIN CHANNEL 28F RND 3/8 FF (WOUND CARE) ×4 IMPLANT
DRAPE BILATERAL SPLIT (DRAPES) ×2 IMPLANT
DRAPE C-ARM 42X72 X-RAY (DRAPES) ×2 IMPLANT
DRAPE CV SPLIT W-CLR ANES SCRN (DRAPES) ×2 IMPLANT
DRAPE INCISE IOBAN 66X45 STRL (DRAPES) ×4 IMPLANT
DRAPE SLUSH/WARMER DISC (DRAPES) ×2 IMPLANT
DRSG COVADERM 4X8 (GAUZE/BANDAGES/DRESSINGS) ×2 IMPLANT
ELECT BLADE 6.5 EXT (BLADE) ×2 IMPLANT
ELECT REM PT RETURN 9FT ADLT (ELECTROSURGICAL) ×4
ELECTRODE REM PT RTRN 9FT ADLT (ELECTROSURGICAL) ×2 IMPLANT
FEMORAL VENOUS CANN RAP (CANNULA) IMPLANT
GAUZE SPONGE 4X4 12PLY STRL (GAUZE/BANDAGES/DRESSINGS) ×2 IMPLANT
GLOVE ORTHO TXT STRL SZ7.5 (GLOVE) ×6 IMPLANT
GOWN STRL REUS W/ TWL LRG LVL3 (GOWN DISPOSABLE) ×4 IMPLANT
GOWN STRL REUS W/TWL LRG LVL3 (GOWN DISPOSABLE) ×8
KIT BASIN OR (CUSTOM PROCEDURE TRAY) ×2 IMPLANT
KIT DILATOR VASC 18G NDL (KITS) ×2 IMPLANT
KIT DRAINAGE VACCUM ASSIST (KITS) ×1 IMPLANT
KIT ROOM TURNOVER OR (KITS) ×2 IMPLANT
KIT SUCTION CATH 14FR (SUCTIONS) ×2 IMPLANT
LEAD PACING MYOCARDI (MISCELLANEOUS) ×2 IMPLANT
LINE VENT (MISCELLANEOUS) ×1 IMPLANT
LOOP VESSEL SUPERMAXI WHITE (MISCELLANEOUS) ×1 IMPLANT
NDL AORTIC ROOT 14G 7F (CATHETERS) ×1 IMPLANT
NEEDLE AORTIC ROOT 14G 7F (CATHETERS) ×2 IMPLANT
NS IRRIG 1000ML POUR BTL (IV SOLUTION) ×10 IMPLANT
PACK OPEN HEART (CUSTOM PROCEDURE TRAY) ×2 IMPLANT
PAD ARMBOARD 7.5X6 YLW CONV (MISCELLANEOUS) ×4 IMPLANT
PAD ELECT DEFIB RADIOL ZOLL (MISCELLANEOUS) ×2 IMPLANT
PATCH CORMATRIX 4CMX7CM (Prosthesis & Implant Heart) ×1 IMPLANT
PROBE CRYO2-ABLATION MALLABLE (MISCELLANEOUS) IMPLANT
RETRACTOR TRL SOFT TISSUE LG (INSTRUMENTS) IMPLANT
RETRACTOR TRM SOFT TISSUE 7.5 (INSTRUMENTS) IMPLANT
SET CANNULATION TOURNIQUET (MISCELLANEOUS) ×2 IMPLANT
SET CARDIOPLEGIA MPS 5001102 (MISCELLANEOUS) ×1 IMPLANT
SET IRRIG TUBING LAPAROSCOPIC (IRRIGATION / IRRIGATOR) ×2 IMPLANT
SOLUTION ANTI FOG 6CC (MISCELLANEOUS) ×2 IMPLANT
SPONGE GAUZE 4X4 12PLY STER LF (GAUZE/BANDAGES/DRESSINGS) ×1 IMPLANT
SUT BONE WAX W31G (SUTURE) ×2 IMPLANT
SUT E-PACK MINIMALLY INVASIVE (SUTURE) ×2 IMPLANT
SUT ETHIBOND (SUTURE) IMPLANT
SUT ETHIBOND 2 0 SH (SUTURE) IMPLANT
SUT ETHIBOND 2 0 V4 (SUTURE) IMPLANT
SUT ETHIBOND 2 0V4 GREEN (SUTURE) IMPLANT
SUT ETHIBOND 2-0 RB-1 WHT (SUTURE) IMPLANT
SUT ETHIBOND 4 0 TF (SUTURE) IMPLANT
SUT ETHIBOND 5 0 C 1 30 (SUTURE) IMPLANT
SUT ETHIBOND NAB MH 2-0 36IN (SUTURE) ×4 IMPLANT
SUT ETHIBOND X763 2 0 SH 1 (SUTURE) ×2 IMPLANT
SUT GORETEX 6.0 TH-9 30 IN (SUTURE) IMPLANT
SUT GORETEX CV 4 TH 22 36 (SUTURE) ×2 IMPLANT
SUT GORETEX CV-5THC-13 36IN (SUTURE) IMPLANT
SUT GORETEX CV4 TH-18 (SUTURE) ×4 IMPLANT
SUT GORETEX TH-18 36 INCH (SUTURE) IMPLANT
SUT MNCRL AB 3-0 PS2 27 (SUTURE) ×2 IMPLANT
SUT PROLENE 3 0 SH 1 (SUTURE) ×4 IMPLANT
SUT PROLENE 3 0 SH1 36 (SUTURE) ×2 IMPLANT
SUT PROLENE 4 0 RB 1 (SUTURE) ×4
SUT PROLENE 4-0 RB1 .5 CRCL 36 (SUTURE) IMPLANT
SUT PROLENE 5 0 C 1 36 (SUTURE) ×2 IMPLANT
SUT PROLENE 6 0 C 1 30 (SUTURE) ×2 IMPLANT
SUT SILK  1 MH (SUTURE) ×1
SUT SILK 1 MH (SUTURE) IMPLANT
SUT SILK 1 TIES 10X30 (SUTURE) ×1 IMPLANT
SUT SILK 2 0 SH CR/8 (SUTURE) ×1 IMPLANT
SUT SILK 2 0 TIES 10X30 (SUTURE) ×1 IMPLANT
SUT SILK 2 0SH CR/8 30 (SUTURE) ×2 IMPLANT
SUT SILK 3 0 (SUTURE) ×2
SUT SILK 3 0 SH CR/8 (SUTURE) ×1 IMPLANT
SUT SILK 3-0 18XBRD TIE 12 (SUTURE) IMPLANT
SUT TEM PAC WIRE 2 0 SH (SUTURE) ×2 IMPLANT
SUT VIC AB 2-0 CTX 36 (SUTURE) ×2 IMPLANT
SUT VIC AB 2-0 UR6 27 (SUTURE) ×2 IMPLANT
SUT VIC AB 3-0 SH 8-18 (SUTURE) ×4 IMPLANT
SUT VICRYL 2 TP 1 (SUTURE) ×1 IMPLANT
SYRINGE 10CC LL (SYRINGE) ×2 IMPLANT
SYSTEM SAHARA CHEST DRAIN ATS (WOUND CARE) ×4 IMPLANT
TAPE CLOTH SURG 4X10 WHT LF (GAUZE/BANDAGES/DRESSINGS) ×1 IMPLANT
TOWEL OR 17X24 6PK STRL BLUE (TOWEL DISPOSABLE) ×4 IMPLANT
TOWEL OR 17X26 10 PK STRL BLUE (TOWEL DISPOSABLE) ×4 IMPLANT
TRAY FOLEY IC TEMP SENS 16FR (CATHETERS) ×2 IMPLANT
TROCAR XCEL BLADELESS 5X75MML (TROCAR) ×2 IMPLANT
TROCAR XCEL NON-BLD 11X100MML (ENDOMECHANICALS) ×4 IMPLANT
TUBE SUCT INTRACARD DLP 20F (MISCELLANEOUS) ×2 IMPLANT
TUNNELER SHEATH ON-Q 11GX8 DSP (PAIN MANAGEMENT) IMPLANT
UNDERPAD 30X30 INCONTINENT (UNDERPADS AND DIAPERS) ×2 IMPLANT
WATER STERILE IRR 1000ML POUR (IV SOLUTION) ×4 IMPLANT
WIRE J 3MM .035X145CM (WIRE) ×3 IMPLANT
YANKAUER SUCT BULB TIP NO VENT (SUCTIONS) ×1 IMPLANT

## 2015-05-28 NOTE — Anesthesia Preprocedure Evaluation (Signed)
Anesthesia Evaluation  Patient identified by MRN, date of birth, ID band Patient awake    Reviewed: Allergy & Precautions, NPO status , Patient's Chart, lab work & pertinent test results, reviewed documented beta blocker date and time   History of Anesthesia Complications Negative for: history of anesthetic complications  Airway Mallampati: III  TM Distance: <3 FB Neck ROM: Full    Dental  (+) Missing, Poor Dentition, Edentulous Upper   Pulmonary shortness of breath, sleep apnea ,    breath sounds clear to auscultation       Cardiovascular hypertension, Pt. on medications and Pt. on home beta blockers + CAD and +CHF  + dysrhythmias Atrial Fibrillation  Rhythm:Regular Rate:Bradycardia     Neuro/Psych negative neurological ROS  negative psych ROS   GI/Hepatic negative GI ROS, Neg liver ROS,   Endo/Other  negative endocrine ROS  Renal/GU negative Renal ROS     Musculoskeletal  (+) Arthritis ,   Abdominal   Peds  Hematology negative hematology ROS (+)   Anesthesia Other Findings   Reproductive/Obstetrics                             Anesthesia Physical Anesthesia Plan  ASA: III  Anesthesia Plan: General   Post-op Pain Management:    Induction: Intravenous  Airway Management Planned: Double Lumen EBT  Additional Equipment: Arterial line, Ultrasound Guidance Line Placement and CVP  Intra-op Plan:   Post-operative Plan: Post-operative intubation/ventilation  Informed Consent: I have reviewed the patients History and Physical, chart, labs and discussed the procedure including the risks, benefits and alternatives for the proposed anesthesia with the patient or authorized representative who has indicated his/her understanding and acceptance.   Dental advisory given  Plan Discussed with: CRNA and Surgeon  Anesthesia Plan Comments:         Anesthesia Quick Evaluation

## 2015-05-28 NOTE — Interval H&P Note (Signed)
History and Physical Interval Note:  05/28/2015 6:49 AM  [name]  has presented today for surgery, with the diagnosis of AFIB  The various methods of treatment have been discussed with the patient and family. After consideration of risks, benefits and other options for treatment, the patient has consented to  Procedure(s): MINIMALLY INVASIVE MAZE PROCEDURE (N/A) TRANSESOPHAGEAL ECHOCARDIOGRAM (TEE) (N/A) as a surgical intervention .  The patient's history has been reviewed, patient examined, no change in status, stable for surgery.  I have reviewed the patient's chart and labs.  Questions were answered to the patient's satisfaction.     Purcell Nails

## 2015-05-28 NOTE — Procedures (Signed)
Extubation Procedure Note  Patient Details:   Name: [name] DOB: [birth year] MRN: [id number]   Airway Documentation:     Evaluation  O2 sats: stable throughout Complications: No apparent complications Patient did tolerate procedure well. Bilateral Breath Sounds: Clear, Diminished   Yes NIF=-40 with Vital Capacity=.800-1.00 No stridor heard in upper airways.  Leafy Half 05/28/2015, 8:33 PM

## 2015-05-28 NOTE — Anesthesia Procedure Notes (Addendum)
Procedure Name: Intubation Date/Time: 05/28/2015 7:58 AM Performed by: Dairl Ponder Pre-anesthesia Checklist: Patient identified, Emergency Drugs available, Suction available, Patient being monitored and Timeout performed Patient Re-evaluated:Patient Re-evaluated prior to inductionOxygen Delivery Method: Circle system utilized Preoxygenation: Pre-oxygenation with 100% oxygen Intubation Type: IV induction Ventilation: Two handed mask ventilation required and Oral airway inserted - appropriate to patient size Laryngoscope Size: Miller and 3 Grade View: Grade I Endobronchial tube: Left, Double lumen EBT, EBT position confirmed by fiberoptic bronchoscope and EBT position confirmed by auscultation and 39 Fr Number of attempts: 2 Airway Equipment and Method: Stylet Placement Confirmation: positive ETCO2,  breath sounds checked- equal and bilateral and ETT inserted through vocal cords under direct vision Secured at: 30 cm Tube secured with: Tape Dental Injury: Teeth and Oropharynx as per pre-operative assessment  Comments: Patient with anterior larynx. Unable to visualize with Mac 3 blade. Grade I view with Miller 3.   Intubated by Timoteo Ace, SRNA    Central Venous Catheter Insertion Performed by: anesthesiologist Patient location: Pre-op. Preanesthetic checklist: patient identified, IV checked, site marked, risks and benefits discussed, surgical consent, monitors and equipment checked, pre-op evaluation, timeout performed and anesthesia consent Position: Trendelenburg Lidocaine 1% used for infiltration Landmarks identified and Seldinger technique used Catheter size: 8.5 Fr Central line was placed.Sheath introducer Procedure performed using ultrasound guided technique. Attempts: 1 Following insertion, line sutured and dressing applied. Post procedure assessment: blood return through all ports, free fluid flow and no air. Patient tolerated the procedure well with no immediate  complications.

## 2015-05-28 NOTE — Transfer of Care (Signed)
Immediate Anesthesia Transfer of Care Note  Patient: [name]  Procedure(s) Performed: Procedure(s): MINIMALLY INVASIVE MAZE PROCEDURE (N/A) TRANSESOPHAGEAL ECHOCARDIOGRAM (TEE) (N/A)  Patient Location: SICU  Anesthesia Type:General  Level of Consciousness: Patient remains intubated per anesthesia plan  Airway & Oxygen Therapy: Patient remains intubated per anesthesia plan  Post-op Assessment: Report given to RN and Post -op Vital signs reviewed and stable  Post vital signs: Reviewed and stable  Last Vitals:  Filed Vitals:   05/28/15 0601 05/28/15 1412  BP: 172/79 101/65  Pulse: 43 70  Temp: 36.7 C   Resp: 18 12    Complications: No apparent anesthesia complications

## 2015-05-28 NOTE — Progress Notes (Signed)
  Echocardiogram Echocardiogram Transesophageal has been performed.  [name] 05/28/2015, 9:41 AM

## 2015-05-28 NOTE — Progress Notes (Signed)
[facility] BRIEF SICU PROGRESS NOTE  Day of Surgery  S/P Procedure(s) (LRB): MINIMALLY INVASIVE MAZE PROCEDURE (N/A) TRANSESOPHAGEAL ECHOCARDIOGRAM (TEE) (N/A)   Waking up on vent.  Initial attempt at vent wean notable for respiratory acidosis - back on full vent support NSR - AAI paced w/ stable hemodynamics, no drips O2 sats 98% on 40% FiO2 UOP 100-150 mL/hr Labs okay  Plan: Continue routine early postop  Purcell Nails, MD 05/28/2015 7:15 PM

## 2015-05-28 NOTE — Op Note (Signed)
CARDIOTHORACIC SURGERY OPERATIVE NOTE  Date of Procedure:   05/28/2015  Preoperative Diagnosis:    Recurrent Persistent Atrial Fibrillation  Postoperative Diagnosis: Same  Procedure:    Minimally-Invasive Maze Procedure  Complete bilateral atrial lesion set using cryothermy and bipolar radiofrequency ablation  Oversewing of Left Atrial Appendage   Surgeon: Salvatore Decent. Cornelius Moras, MD  Assistant: Ardelle Balls, PA-C  Anesthesia: Corky Sox, MD  Operative Findings:  Normal LV systolic function  Mild LV hypertrophy  Trace central mitral regurgitation  Bilateral atrial chamber enlargement               BRIEF CLINICAL NOTE AND INDICATIONS FOR SURGERY  Patient is a 60 year old moderately obese male with history of rheumatic fever during childhood, nonischemic cardiomyopathy, chronic systolic congestive heart failure, chronic persistent atrial fibrillation that has failed medical therapy with numerous agents and catheter-based atrial fibrillation ablation on 2 previous occasions, and OSA who has been referred for possible maze procedure. The patient states that he was first diagnosed with atrial fibrillation in April 2003. He initially did well on medical therapy but he developed atrial flutter and underwent catheter-based ablation by Dr. Ladona Ridgel in 2006. He later developed persistent atrial fibrillation which has failed medical therapy with both amiodarone and Tikosyn. He states that he did well on amiodarone for a prolonged period of time, but he eventually developed blue-gray skin discoloration. He is undergone cardioversion on numerous occasions and catheter based ablation for atrial fibrillation on 2 previous occasions including 2011 and again in 2014. Following each ablation the patient initially maintained sinus rhythm for a period of time. After his most recent ablation in 2014 he did well until May 2016 when he had a brief recurrence of atrial fibrillation.  Beginning in November 2016 the patient has had increasing episodes of paroxysmal atrial fibrillation, and by the end of December he was staying in persistent atrial fibrillation. He was seen recently in follow-up by Dr. Johney Frame. Transesophageal echocardiogram was performed and notable for the absence of signs of left atrial thrombus. Left ventricular ejection fraction was estimated 35-40% which was somewhat increased in comparison with his last previous transthoracic echocardiogram performed in 2014. The patient has moderate left and right atrial enlargement but does not have any significant valvular disease. Diagnostic cardiac catheterization was performed by Dr. Tresa Endo and notable for the presence of mild nonobstructive coronary artery disease. The patient was referred for surgical consultation to discuss possible maze procedure.  The patient has been seen in consultation and counseled at length regarding the indications, risks and potential benefits of surgery.  All questions have been answered, and the patient provides full informed consent for the operation as described.    DETAILS OF THE OPERATIVE PROCEDURE  Preparation:  The patient is brought to the operating room on the above mentioned date and central monitoring was established by the anesthesia team including placement of Swan-Ganz catheter through the left internal jugular vein.  A radial arterial line is placed. The patient is placed in the supine position on the operating table.  Intravenous antibiotics are administered. General endotracheal anesthesia is induced uneventfully. The patient is initially intubated using a dual lumen endotracheal tube.  A Foley catheter is placed.  Baseline transesophageal echocardiogram was performed.  Findings were notable for normal LV systolic function and mild LV hypertrophy.  There was trace central mitral regurgitation.  The aortic valve is normal. Both the left and right atrium were dilated. There was  no clot in the left atrial appendage.  A soft roll is placed behind the patient's left scapula and the neck gently extended and turned to the left.   The patient's right neck, chest, abdomen, both groins, and both lower extremities are prepared and draped in a sterile manner. A time out procedure is performed.   Surgical Approach:  A right miniature anterolateral thoracotomy incision is performed. The incision is placed just lateral to and superior to the right nipple. The pectoralis major muscle is retracted medially and completely preserved. The right pleural space is entered through the 3rd intercostal space. A soft tissue retractor is placed.  Two 11 mm ports are placed through separate stab incisions inferiorly. The right pleural space is insufflated continuously with carbon dioxide gas through the posterior port during the remainder of the operation.  A pledgeted sutures placed through the dome of the right hemidiaphragm and retracted inferiorly to facilitate exposure.  A longitudinal incision is made in the pericardium 3 cm anterior to the phrenic nerve and silk traction sutures are placed on either side of the incision for exposure.   Extracorporeal Cardiopulmonary Bypass and Myocardial Protection:  A small incision is made in the right inguinal crease and the anterior surface of the right common femoral artery and right common femoral vein are identified.  The patient is placed in Trendelenburg position. The right internal jugular vein is cannulated with Seldinger technique and a guidewire advanced into the right atrium. The patient is heparinized systemically. The right internal jugular vein is cannulated with a 14 [place] pediatric femoral venous cannula. Pursestring sutures are placed on the anterior surface of the right common femoral vein and right common femoral artery. The right common femoral vein is cannulated with the Seldinger technique and a guidewire is advanced under transesophageal  echocardiogram guidance through the right atrium. The femoral vein is cannulated with a long 22 French femoral venous cannula. The right common femoral artery is cannulated with Seldinger technique and a flexible guidewire is advanced until it can be appreciated intraluminally in the descending thoracic aorta on transesophageal echocardiogram. The femoral artery is cannulated with an 18 French femoral arterial cannula.  Adequate heparinization is verified.      The entire pre-bypass portion of the operation was notable for stable hemodynamics.  Cardiopulmonary bypass was begun.  Vacuum assist venous drainage is utilized. The incision in the pericardium is extended in both directions. Venous drainage and exposure are notably excellent.   An attempt is made to place a clip on the left atrial appendage through the transverse sinus.  However, exposure of the patient's left atrial appendage is inadequate.   An antegrade cardioplegia cannula is placed in the ascending aorta.  The patient is cooled to 28C systemic temperature.  The aortic cross clamp is applied and cold blood cardioplegia is delivered initially in an antegrade fashion through the aortic root. The initial cardioplegic arrest is rapid with early diastolic arrest.  Repeat doses of cardioplegia are administered intermittently every 20 to 30 minutes throughout the entire cross clamp portion of the operation through the aortic root in order to maintain completely flat electrocardiogram.  Myocardial protection was felt to be excellent.   Maze Procedure (left atrial lesion set):  A left atriotomy incision was performed through the interatrial groove and extended partially across the back wall of the left atrium after opening the oblique sinus inferiorly.  The mitral valve and floor of the left atrium are exposed using a self-retaining retractor.    The left atrial appendage is oversewn from  within the left atrium using a 2 layer linear closure with  running 3-0 Prolene suture.  The Atricure CryoICE nitrous oxide cryothermy system is utilized for all cryothermy ablation lesions.  The left atrial lesion set of the Cox cryomaze procedure is now performed using 3 minute duration for all cryothermy lesions.  Initially a lesion is placed along the endocardial surface of the left atrium from the caudad apex of the atriotomy incision across the posterior wall of the left atrium onto the posterior mitral annulus.  A mirror image lesion along the epicardial surface is then performed with the probe posterior to the left atrium, crossing over the coronary sinus.  Two lesions are then performed to create a box isolating all of the pulmonary veins from the remainder of the left atrium.  The first lesion is placed from the cephalad apex of the atriotomy incision across the dome of the left atrium to just anterior to the left sided pulmonary veins.  The second lesion completes the box from the caudad apex of the atriotomy incision across the back wall of the left atrium to connect with the previous lesion just anterior to the left sided pulmonary veins.  Finally, the Atricure bipolar radiofrequency ablation clamp was utilized to create an elliptical lesion around the orifice of the right-sided pulmonary veins.  Rewarming was begun.  The atriotomy was closed using a 2-layer closure of running 3-0 Prolene suture after placing a sump drain across the mitral valve to serve as a left ventricular vent.  One final dose of warm retrograde "hot shot" cardioplegia was administered antegrade through the aortic root.  The aortic cross clamp was removed after a total cross clamp time of 67 minutes.   Maze Procedure (right atrial lesion set):  A small oblique incision is made in the lateral wall of the right atrium.  The AtriCure Synergy bipolar radiofrequency ablation clamp is utilized to create a series of linear lesions in the right atrium, each with one limb of the clamp along  the endocardial surface and the other along the epicardial surface. The first lesion is placed from the posterior apex of the atriotomy incision and along the lateral wall of the right atrium to reach the lateral aspect of the superior vena cava. A second lesion is placed in the opposite direction from the posterior apex of the atriotomy incision along the lateral wall to reach the lateral aspect of the inferior vena cava. A third lesion is placed from the midportion of the atriotomy incision extending at a right angle to reach the tip of the right atrial appendage. A fourth lesion is placed from the anterior apex of the atriotomy incision in an anterior and inferior direction to reach the acute margin of the heart. Finally, the cryotherapy probe is utilized to complete the right atrial lesion set by placing the probe along the endocardial surface of the right atrium from the anterior apex of the atriotomy incision to reach the tricuspid annulus at the 2:00 position. The atriotomy incision is closed with a 2 layer closure of running 4-0 Prolene suture.   Procedure Completion:  Epicardial pacing wires are fixed to the inferior wall of the right ventricule and to the right atrial appendage. The patient is rewarmed to 37C temperature. The left ventricular vent is removed.  The antegrade cardioplegia cannula is removed. The patient is weaned and disconnected from cardiopulmonary bypass.  The patient's rhythm at separation from bypass was AV paced.  The patient was weaned from bypass  without any inotropic support. Total cardiopulmonary bypass time for the operation was 127 minutes.  Followup transesophageal echocardiogram performed after separation from bypass revealed no significant changes from preoperatively.  The left atrial appendage was no longer be visualized.  The femoral arterial and venous cannulae were removed uneventfully. There was a palpable pulse in the distal right common femoral artery after  removal of the cannula. Protamine was administered to reverse the anticoagulation. The right internal jugular cannula was removed and manual pressure held on the neck for 15 minutes.  Single lung ventilation was begun. The atriotomy closure was inspected for hemostasis. The pericardial sac was drained using a 28 French Bard drain placed through the anterior port incision.  The pericardium was closed using a patch of core matrix bovine submucosal tissue patch. The right pleural space is irrigated with saline solution and inspected for hemostasis. The right pleural space was drained using a 28 French Bard drain placed through the posterior port incision. The miniature thoracotomy incision was closed in multiple layers in routine fashion. The right groin incision was inspected for hemostasis and closed in multiple layers in routine fashion.  The post-bypass portion of the operation was notable for stable rhythm and hemodynamics.  No blood products were administered during the operation.   Disposition:  The patient tolerated the procedure well.  The patient was reintubated using a single lumen endotracheal tube and subsequently transported to the surgical intensive care unit in stable condition. There were no intraoperative complications. All sponge instrument and needle counts are verified correct at completion of the operation.    Salvatore Decent. Cornelius Moras MD 05/28/2015 3:22 PM

## 2015-05-28 NOTE — H&P (Signed)
[address]       [name] [address]             [phone]          CARDIOTHORACIC SURGERY HISTORY AND PHYSICAL EXAM  Referring Provider is Hillis Range, MD PCP is Sonda Primes, MD  Chief Complaint  Patient presents with  . Atrial Fibrillation    eval for MAZE...CATH 03/25/15, TEE 03/06/15    HPI:  Patient is a 60 year old moderately obese male with history of rheumatic fever during childhood, nonischemic cardiomyopathy, chronic systolic congestive heart failure, chronic persistent atrial fibrillation that has failed medical therapy with numerous agents and catheter-based atrial fibrillation ablation on 2 previous occasions, and OSA who has been referred for possible maze procedure. The patient states that he was first diagnosed with atrial fibrillation in April 2003. He initially did well on medical therapy but he developed atrial flutter and underwent catheter-based ablation by Dr. Ladona Ridgel in 2006. He later developed persistent atrial fibrillation which has failed medical therapy with both amiodarone and Tikosyn. He states that he did well on amiodarone for a prolonged period of time, but he eventually developed blue-gray skin discoloration. He is undergone cardioversion on numerous occasions and catheter based ablation for atrial fibrillation on 2 previous occasions including 2011 and again in 2014. Following each ablation the patient initially maintained sinus rhythm for a period of time. After his most recent ablation in 2014 he did well until May 2016 when he had a brief recurrence of atrial fibrillation. Beginning in November 2016 the patient has had increasing episodes of paroxysmal atrial fibrillation, and by the end of December he was staying in persistent atrial fibrillation. He was seen recently in follow-up by Dr. Johney Frame. Transesophageal echocardiogram was performed and notable for the absence of signs of left atrial thrombus. Left  ventricular ejection fraction was estimated 35-40% which was somewhat increased in comparison with his last previous transthoracic echocardiogram performed in 2014. The patient has moderate left and right atrial enlargement but does not have any significant valvular disease. Diagnostic cardiac catheterization was performed by Dr. Tresa Endo and notable for the presence of mild nonobstructive coronary artery disease. The patient was referred for surgical consultation to discuss possible maze procedure.  Patient is married and lives locally in [place] with his wife and mother-in-law. He works full-time for [employer]. The patient states that when he was younger he exercised routinely and was quite fit. He has experienced exertional fatigue and shortness of breath ever since he first developed atrial fibrillation in 2003. As he has become less physically active yet gained some weight including tended 12 pounds over the past year. He has been dealing with atrial fibrillation for quite some time and can tell when he is out of rhythm. For the past several weeks he has had exertional shortness of breath. Approximately 2 weeks ago his resting pulse intermittently dropped to below 40, causing him increased shortness of breath and frequent dizzy spells without syncope. His pulse rate has improved over the last week or 2. He gets short of breath with more strenuous activity and gets tired in the afternoons. He denies any resting shortness of breath, PND, orthopnea, or lower extremity edema. He has never had any chest pain or chest tightness either with activity or at rest. He otherwise reports no other significant physical problems. He enjoys bowling on a regular basis and participates in national bowling tournaments. The patient has been chronically  anticoagulated off and on through the years, most recently using Pradaxa.   Patient returns to the office today with tentative plans to proceed with  minimally invasive maze procedure later this week. He was last seen here in the office on 05/04/2015. He reports no new problems or complaints over the last few weeks. He continues to go in and out of atrial fibrillation. His resting pulse has been running a bit slower, typically between 40 and 55 bpm. He states that he still has periods where it goes faster while he is in atrial fibrillation. He has not had any dizzy spells. He stopped taking Pradaxa in anticipation of surgery.           Past Medical History  Diagnosis Date  . Persistent atrial fibrillation (HCC)     a. s/p afib ablation 02/13/10 and redo ablation 10/26/12  . Atrial flutter [facility])     s/p CTI ablation 2006 by Ladona Ridgel  . Non-ischemic cardiomyopathy (HCC)     tachycardiac mediated  . ALLERGIC RHINITIS   . Rheumatic fever     childhood  . Chronic systolic (congestive) heart failure (HCC)   . Essential hypertension   . Obesity   . CAD (coronary artery disease) 03/25/15    nonobstructive by cath  . Dysrhythmia     AFIB  . Sleep apnea     PATIENT STATES HE DOES NOT HAVE  . Shortness of breath dyspnea     W/ EXERTION   . Arthritis     Past Surgical History  Procedure Laterality Date  . Atrial ablation surgery  2006    CTI ablation  . Joint replacement Left     toe joint  . Tonsillectomy    . Atrial fibrillation ablation  02/13/10  . Tee without cardioversion N/A 10/25/2012    Procedure: TRANSESOPHAGEAL ECHOCARDIOGRAM (TEE);  Surgeon: Lewayne Bunting, MD;  Location: [facility] ENDOSCOPY;  Service: Cardiovascular;  Laterality: N/A;  . Atrial fibrillation ablation N/A 10/26/2012    Procedure: ATRIAL FIBRILLATION ABLATION;  Surgeon: Hillis Range, MD;  Location: [facility] CATH LAB;  Service: Cardiovascular;  Laterality: N/A;  . Tee without cardioversion N/A 03/06/2015    Procedure: TRANSESOPHAGEAL ECHOCARDIOGRAM (TEE);  Surgeon: Thurmon Fair, MD;  Location: [facility] ENDOSCOPY;  Service: Cardiovascular;  Laterality: N/A;  . Cardioversion  N/A 03/06/2015    Procedure: CARDIOVERSION;  Surgeon: Thurmon Fair, MD;  Location: [facility] ENDOSCOPY;  Service: Cardiovascular;  Laterality: N/A;  . Cardiac catheterization N/A 03/25/2015    Procedure: Left Heart Cath and Coronary Angiography;  Surgeon: Lennette Bihari, MD;  Location: [facility] INVASIVE CV LAB;  Service: Cardiovascular;  Laterality: N/A;  . Cardioversion N/A 04/15/2015    Procedure: CARDIOVERSION;  Surgeon: Vesta Mixer, MD;  Location: [facility] ENDOSCOPY;  Service: Cardiovascular;  Laterality: N/A;    Family History  Problem Relation Age of Onset  . Lung cancer Father     smoker  . Lung cancer Mother     smoker  . Diabetes Neg Hx   . Prostate cancer Neg Hx   . Colon cancer Neg Hx     Social History Social History  Substance Use Topics  . Smoking status: Never Smoker   . Smokeless tobacco: Never Used  . Alcohol Use: No    Prior to Admission medications   Medication Sig Start Date End Date Taking? Authorizing Provider  amiodarone (PACERONE) 200 MG tablet Take 2 tablets (400 mg total) by mouth daily. 04/13/15  Yes Hillis Range, MD  carvedilol (COREG) 25  MG tablet Take 1.5 tablets (37.5 mg total) by mouth 2 (two) times daily with a meal. Patient taking differently: Take 25 mg by mouth 2 (two) times daily with a meal.  03/30/15  Yes Newman Nip, NP  dabigatran (PRADAXA) 150 MG CAPS capsule Take 150 mg by mouth 2 (two) times daily. Reported on 05/25/2015   Yes Historical Provider, MD  ramipril (ALTACE) 10 MG capsule TAKE 1 CAPSULE (10 MG TOTAL) BY MOUTH DAILY. 10/29/14  Yes Newman Nip, NP  rosuvastatin (CRESTOR) 5 MG tablet Take 1 tablet (5 mg total) by mouth daily. Patient not taking: Reported on 04/30/2015 04/13/15   Hillis Range, MD    Allergies  Allergen Reactions  . Penicillins Other (See Comments)    ## SYNCOPE ## (Tolerates amoxicillin) Has patient had a PCN reaction causing immediate rash, facial/tongue/throat swelling, SOB or lightheadedness with hypotension: Yes Has  patient had a PCN reaction causing severe rash involving mucus membranes or skin necrosis: No Has patient had a PCN reaction that required hospitalization No Has patient had a PCN reaction occurring within the last 10 years: No If all of the above answers are "NO", then may proceed with Cephalosporin use.       Review of Systems:  General:normal appetite, decreased energy, + weight gain, no weight loss, no fever Cardiac:no chest pain with exertion, no chest pain at rest, + SOB with exertion, no resting SOB, no PND, no orthopnea, + palpitations, + arrhythmia, + atrial fibrillation, no LE edema, + dizzy spells, no syncope Respiratory:+ exertional shortness of breath, no home oxygen, no productive cough, no dry cough, no bronchitis, no wheezing, no hemoptysis, no asthma, no pain with inspiration or cough, + sleep apnea, no CPAP at night GI:no difficulty swallowing, no reflux, no frequent heartburn, no hiatal hernia, no abdominal pain, no constipation, no diarrhea, no hematochezia, no hematemesis, no melena GU:no dysuria, no frequency, no urinary tract infection, no hematuria, no enlarged prostate, no kidney stones, no kidney disease Vascular:no pain suggestive of claudication, no pain in feet, no leg cramps, no varicose veins, no DVT, no non-healing foot ulcer Neuro:no stroke, no TIA's, no seizures, + headaches, no temporary blindness one eye, no slurred speech, no peripheral neuropathy, no chronic pain, no instability of gait, no memory/cognitive dysfunction Musculoskeletal:no arthritis, no joint swelling, no myalgias, no difficulty walking, normal mobility  Skin:no rash, no itching, no  skin infections, no pressure sores or ulcerations Psych:no anxiety, no depression, no nervousness, + unusual recent stress related to recent death of father-in-law Eyes:no blurry vision, no floaters, no recent vision changes, + wears glasses or contacts ENT:no hearing loss, + loose or painful teeth - only 3 teeth left on lower jaw, + upper dentures, last saw dentist several years ago Hematologic:no easy bruising, no abnormal bleeding, no clotting disorder, no frequent epistaxis Endocrine:no diabetes, does not check CBG's at home   Physical Exam:  BP 122/64 mmHg  Pulse 46  Resp 16  Ht  (1.702 m)  Wt 208 lb (94.348 kg)  BMI 32.57 kg/m2  SpO2 95% General:Moderately obese, o/w well-appearing HEENT:Unremarkable  Neck:no JVD, no bruits, no adenopathy  Chest:clear to auscultation, symmetrical breath sounds, no wheezes, no rhonchi  ZO:[id number] rate and rhythm, no murmur  Abdomen:soft, non-tender, no masses  Extremities:warm, well-perfused, pulses diminished but palpable, no LE edema Rectal/GUDeferred Neuro:Grossly non-focal and symmetrical throughout Skin:Clean and dry, no rashes, no breakdown   Diagnostic Tests:  Transthoracic Echocardiography  Patient:  Berish, Bohman MR #:  [id number] Study Date: 02/20/2013 Gender:   M Age:    69 Height:   172.7cm Weight:   91.2kg BSA:    2.87m^2 Pt.  Status: Room:  ATTENDING  Default, Provider W REFERRING  [place], [place] ORDERING   Hillis Range, MD SONOGRAPHER Aida Raider, RDCS PERFORMING  [facility], [facility] cc:  ------------------------------------------------------------ LV EF: 50% -  55%  ------------------------------------------------------------ Indications:   Atrial fibrillation - 427.31.  ------------------------------------------------------------ History:  PMH: Acquired from the patient and from the patient's chart. PMH: Sinus Bradycardia. Dilated Cardiomyopathy. History of Pneumonia. Atrial Fibrillation. Sleep Apnea. Risk factors: Obese.  ------------------------------------------------------------ Study Conclusions  - Left ventricle: The cavity size was mildly dilated. Wall thickness was increased in a pattern of mild LVH. Systolic function was normal. The estimated ejection fraction was in the range of 50% to 55%. Wall motion was normal; there were no regional wall motion abnormalities. Features are consistent with a pseudonormal left ventricular filling pattern, with concomitant abnormal relaxation and increased filling pressure (grade 2 diastolic dysfunction). - Left atrium: The atrium was moderately dilated. Impressions:  - Compared to TEE 10/25/12, LV function has improved.  ------------------------------------------------------------ Labs, prior tests, procedures, and surgery: Ablation-August 2014.     Transthoracic echocardiography. M-mode, complete 2D, spectral Doppler, and color Doppler. Height: Height: 172.7cm. Height: 68in. Weight: Weight: 91.2kg. Weight: 200.6lb. Body mass index: BMI: 30.6kg/m^2. Body surface area:  BSA: 2.103m^2. Blood pressure:   110/72. Patient status: Outpatient. Location: [facility]  3  ------------------------------------------------------------  ------------------------------------------------------------ Left ventricle: The cavity size was mildly dilated. Wall thickness was increased in a pattern of mild LVH. Systolic function was normal. The estimated ejection fraction was in the range of 50% to 55%. Wall motion was normal; there were no regional wall motion abnormalities. Features are consistent with a pseudonormal left ventricular filling pattern, with concomitant abnormal relaxation and increased filling pressure (grade 2 diastolic dysfunction).  ------------------------------------------------------------ Aortic valve:  Trileaflet; normal thickness leaflets. Mobility was not restricted. Doppler: Transvalvular velocity was within the normal range. There was no stenosis. No regurgitation.  ------------------------------------------------------------ Aorta: Aortic root: The aortic root was normal in size.  ------------------------------------------------------------ Mitral valve:  Structurally normal valve.  Mobility was not restricted. Doppler: Transvalvular velocity was within the normal range. There was no evidence for stenosis. No regurgitation.  Peak gradient: 3mm Hg (D).  ------------------------------------------------------------ Left atrium: The atrium was moderately dilated.  ------------------------------------------------------------ Right ventricle: The cavity size was normal. Systolic function was normal.  ------------------------------------------------------------ Pulmonic valve:  Doppler: Transvalvular velocity was within the normal range. There was no evidence for stenosis. Trivial regurgitation.  ------------------------------------------------------------ Tricuspid valve:  Structurally normal valve.  Doppler: Transvalvular velocity was within the normal range.  No regurgitation.  ------------------------------------------------------------ Right atrium: The atrium was normal in size.  ------------------------------------------------------------ Pericardium: There was no pericardial effusion.  ------------------------------------------------------------ Systemic veins: Inferior vena cava: The vessel was normal in size.  ------------------------------------------------------------  2D measurements    Normal Doppler measurements  Normal Left ventricle         Left ventricle LVID ED,  58.8 mm   43-52  Ea, lat   8.11 cm/s ------ chord,             ann, tiss PLAX              DP LVID ES,  39.6 mm   23-38  E/Ea, lat 10.27    ------ chord,             ann, tiss PLAX  DP FS, chord,  33 %   >29   Ea, med   5.81 cm/s ------ PLAX              ann, tiss LVPW, ED  12.9 mm   ------ DP IVS/LVPW  0.92    <1.3  E/Ea, med 14.34    ------ ratio, ED           ann, tiss Ventricular septum       DP IVS, ED  11.9 mm   ------ LVOT LVOT              Peak vel,  106 cm/s ------ Diam, S   22 mm   ------ S Area    3.8 cm^2  ------ VTI, S   26.2 cm  ------ Diam     22 mm   ------ Stroke vol 99.6 ml  ------ Aorta             Stroke   48.6 ml/m^ ------ Root diam,  37 mm   ------ index      2 ED               Mitral valve Left atrium          Peak E vel 83.3 cm/s ------ AP dim    62 mm   ------ Peak A vel 59.8 cm/s ------ AP dim   3.02 cm/m^2 <2.2  Decelerati  282 ms  150-23 index             on time        0                Peak      3 mm Hg ------                gradient,                D                Peak E/A    1.4    ------                ratio                Right ventricle                Sa vel,   16.4 cm/s ------                lat ann,                tiss DP  ------------------------------------------------------------ Prepared and Electronically Authenticated by  Olga Millers 2014-12-24T11:08:[id number]   Transesophageal Echocardiography  Patient:  Wilberth, Damon MR #:    [id number] Study Date: 03/06/2015 Gender:   M Age:    59 Height:   170.2 cm Weight:   92.7 kg BSA:    2.12 m^2 Pt. Status: Room:  Julianne Rice, Elvina Sidle REFERRING  Rudi Coco C ADMITTING  Thurmon Fair, MD ATTENDING  Thurmon Fair, MD PERFORMING  Thurmon Fair, MD SONOGRAPHER Jimmy Reel, RDCS  cc:  ------------------------------------------------------------------- LV EF: 35% -  40%  ------------------------------------------------------------------- Indications:   Atrial fibrillation - 427.31.  ------------------------------------------------------------------- Study Conclusions  - Left ventricle: Systolic function was moderately reduced. The estimated ejection fraction was in the range of 35% to 40%. Diffuse hypokinesis. - Left atrium: The atrium was moderately dilated. No evidence of thrombus in the atrial cavity or appendage. There was mildcontinuous  spontaneous echo contrast (&quot;smoke&quot;) in the cavity and the appendage. - Right atrium: The atrium was moderately dilated. No evidence of thrombus in the atrial cavity or appendage. - Atrial septum: No defect or patent foramen ovale was identified.  Diagnostic transesophageal echocardiography. 2D and color Doppler. Birthdate: Patient birthdate: 10-06-1955. Age: Patient is 61 yr old. Sex: Gender: male.  BMI: 32 kg/m^2. Blood pressure: 128/82 Patient status: Inpatient. Study  date: Study date: 03/06/2015. Study time: 11:28 AM. Location: Bedside.  -------------------------------------------------------------------  ------------------------------------------------------------------- Left ventricle: Systolic function was moderately reduced. The estimated ejection fraction was in the range of 35% to 40%. Diffuse hypokinesis.  ------------------------------------------------------------------- Aortic valve:  Structurally normal valve. Trileaflet; normal thickness leaflets. Cusp separation was normal. Doppler: There was no significant regurgitation.  ------------------------------------------------------------------- Aorta: The aorta was normal, not dilated, and non-diseased. There was no atheroma. There was no evidence for dissection. Aortic root: The aortic root was not dilated. Ascending aorta: The ascending aorta was normal in size. Aortic arch: The aortic arch was normal in size. Descending aorta: The descending aorta was normal in size.  ------------------------------------------------------------------- Mitral valve:  Structurally normal valve.  Leaflet separation was normal. Doppler: There was trivial regurgitation.  ------------------------------------------------------------------- Left atrium: The atrium was moderately dilated. No evidence of thrombus in the atrial cavity or appendage. There was mildcontinuous spontaneous echo contrast (&quot;smoke&quot;) in the cavity and the appendage. The appendage was morphologically a left appendage, multilobulated, and of normal size. Emptying velocity was normal.  ------------------------------------------------------------------- Atrial septum: No defect or patent foramen ovale was identified.  ------------------------------------------------------------------- Right ventricle: The cavity size was normal. Wall thickness was normal. Systolic function was  normal.  ------------------------------------------------------------------- Pulmonic valve:  Structurally normal valve.  Doppler: There was trivial regurgitation.  ------------------------------------------------------------------- Tricuspid valve:  Structurally normal valve.  Leaflet separation was normal. Doppler: There was mild regurgitation.  ------------------------------------------------------------------- Pulmonary artery:  The main pulmonary artery was normal-sized.  ------------------------------------------------------------------- Right atrium: The atrium was moderately dilated. No evidence of thrombus in the atrial cavity or appendage. The appendage was morphologically a right appendage.  ------------------------------------------------------------------- Pericardium: There was no pericardial effusion.  ------------------------------------------------------------------- Post procedure conclusions Ascending Aorta:  - The aorta was normal, not dilated, and non-diseased.  ------------------------------------------------------------------- Prepared and Electronically Authenticated by  Thurmon Fair, MD 2017-01-06T16:28:24   CARDIAC CATHETERIZATION Procedures    Left Heart Cath and Coronary Angiography    Conclusion     Mid LAD lesion, 30% stenosed.  Prox LAD lesion, 30% stenosed.  Ost LAD to Prox LAD lesion, 20% stenosed.  There is moderate to severe left ventricular systolic dysfunction.  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  Nonobstructive CAD with 20 and 30% smooth narrowings in the proximal and mid LAD and otherwise normal coronary arteries in a dominant left circumflex system.  RECOMMENDATION: The patient will resume his Pradaxa tomorrow following today's catheterization. As per Dr. Johney Frame, he will be seeing Dr. Barry Dienes for consideration of Maze procedure.     Indications     Congestive dilated cardiomyopathy (HCC) [I42.0 (ICD-10-CM)]   Persistent atrial fibrillation (HCC) [I48.1 (ICD-10-CM)]    Technique and Indications    Mr. Jewell Ryans is a 60 year old white male with a history of persistent atrial arrhythmias (atypical atrial flutter), AF, and reduced LV function. A recent TEE demonstrated an EF of 35-40%. The patient recently saw Dr. Johney Frame with recurrent atrial arrhythmia. He is to see Dr. Barry Dienes for consideration of a Maze procedure. He is referred for cardiac catheterization.  The patient was brought to the  cardiac catheterization lab in the fasting state. The patient was premedicated with Versed 2 mg and fentanyl 50 mcg. A right radial approach was utilized after an Allen's test verified adequate circulation. The right radial artery was punctured via the Seldinger technique, and a 6 [place] Glidesheath Slender was inserted without difficulty. A radial cocktail consisting of Verapamil, IV nitroglycerin, and lidocaine was administered. Weight adjusted heparin was administered. A safety J wire was advanced into the ascending aorta. Diagnostic catheterization was done with a 5 [place] TIG 4.0 catheter. A 5 French pigtail catheter was used for left ventriculography. A TR radial band was applied for hemostasis. The patient left the catheterization laboratory in stable condition.  Total contrast used: 80 cc Omnipaque Estimated blood loss <50 mL. There were no immediate complications during the procedure.    Coronary Findings    Dominance: Left   Left Anterior Descending   . Ost LAD to Prox LAD lesion, 20% stenosed.   . Prox LAD lesion, 30% stenosed.   . Mid LAD lesion, 30% stenosed.      Wall Motion                 Left Heart    Left Ventricle The left ventricle is enlarged. There is moderate to severe left ventricular systolic dysfunction. There are wall motion abnormalities in the left ventricle. Global  hypocontractility with an ejection fraction of 30-35%. LVEDP 21 mm Hg.    Coronary Diagrams    Diagnostic Diagram            Implants    Name ID Temporary Type Supply   No information to display    PACS Images    Show images for Cardiac catheterization     Link to Procedure Log    Procedure Log      Hemo Data       Most Recent Value   AO Systolic Pressure  110 mmHg   AO Diastolic Pressure  68 mmHg   AO Mean  86 mmHg   LV Systolic Pressure  113 mmHg   LV Diastolic Pressure  -1 mmHg   LV EDP  21 mmHg   Arterial Occlusion Pressure Extended Systolic Pressure  109 mmHg   Arterial Occlusion Pressure Extended Diastolic Pressure  67 mmHg   Arterial Occlusion Pressure Extended Mean Pressure  86 mmHg   Left Ventricular Apex Extended Systolic Pressure  104 mmHg   Left Ventricular Apex Extended Diastolic Pressure  0 mmHg   Left Ventricular Apex Extended EDP Pressure  13 mmHg   Cardiac CTA MEDICATIONS: Lopressor 10mg  Patient was in rapid atrial fibrillation. A retrospective scan was used TECHNIQUE: The patient was scanned on a [employer] 256 slice scanner. Gantry rotation speed was 270 msecs. Collimation was .9mm. A 120 kV retrospective scan was triggered in the descending thoracic aorta at 111 HU's. Average HR during the scan was 90 bpm. The 3D data set was interpreted on a dedicated work station using MPR, MIP and VRT modes. A total of 80cc of contrast was used. FINDINGS: Non-cardiac: See separate report from [facility]. No significant findings on limited lung and soft tissue windows. There was moderate biatrial enlargement. There was no ASD/PFO noted. There was no LAA thrombus. There were 4 pulmonary veins draining normally into the LA with no anomalies LUPV: Ostium 14.5 mm Area 1.6 cm2 LLPV: Ostium 11.8 mm Area 1.8 cm2 RUPV: Ostium 17.8 mm Area 2.2  cm2 RLPV: Ostium 14.7 mm Area 1.5 cm2 IMPRESSION: 1) Moderate Biatrial  Enlargement 2) No ASD/PFO 3) 4 normal PV;s draining into the LA with no anomalies 4) No LAA thrombus Charlton Haws Electronically Signed  By: Charlton Haws M.D.  On: 04/30/2015 13:40       CTA ABDOMEN AND PELVIS WITH CONTRAST  TECHNIQUE: Multidetector CT imaging of the abdomen and pelvis was performed using the standard protocol during bolus administration of intravenous contrast. Multiplanar reconstructed images and MIPs were obtained and reviewed to evaluate the vascular anatomy.  CONTRAST: 75mL OMNIPAQUE IOHEXOL 350 MG/ML SOLN  COMPARISON: CT the abdomen and pelvis 05/30/2006.  FINDINGS: Lower chest: Mild cardiomegaly. Small hiatal hernia.  Hepatobiliary: No cystic or solid hepatic lesions. No intra or extrahepatic biliary ductal dilatation. Small calcified gallstones lying dependently in the gallbladder measuring up to 9 mm. Gallbladder is nearly decompressed. No gallbladder wall thickening or surrounding inflammatory changes.  Pancreas: No pancreatic mass. No pancreatic ductal dilatation. No pancreatic or peripancreatic fluid or inflammatory changes.  Spleen: Unremarkable.  Adrenals/Urinary Tract: 2.2 cm low-attenuation lesion in the interpolar region of the left kidney is compatible with a simple cyst. Sub cm low-attenuation lesion in the posterior aspect of the interpolar region of the right kidney is too small to definitively characterize, but also favored to represent a tiny cyst. Bilateral adrenal glands are normal in appearance. No hydroureteronephrosis. Urinary bladder is normal in appearance.  Stomach/Bowel: The appearance of the stomach is normal. No pathologic dilatation of small bowel or colon. Normal appendix.  Vascular/Lymphatic: Mild atherosclerosis throughout the abdominal and pelvic vasculature, without evidence of aneurysm or dissection. Celiac axis and  major branches, superior mesenteric artery and major branches, and inferior mesenteric artery are all widely patent without hemodynamically significant stenosis. Single right renal artery widely patent. Three left renal arteries are widely patent. Common iliac arteries are widely patent bilaterally, with minimal diameter on the right have 8 mm. External iliac arteries bilaterally are widely patent and moderately tortuous with minimal diameter on the right of 9 mm. Common femoral arteries bilaterally are widely patent, with a minimal diameter of 8 mm on the left side. No lymphadenopathy noted in the abdomen or pelvis.  Reproductive: Prostate gland and seminal vesicles are unremarkable in appearance.  Other: No significant volume of ascites. No pneumoperitoneum.  Musculoskeletal: There are no aggressive appearing lytic or blastic lesions noted in the visualized portions of the skeleton.  Review of the MIP images confirms the above findings.  IMPRESSION: 1. Mild atherosclerosis throughout the abdominal and pelvic vasculature. Although there is some moderate tortuosity of the external iliac arteries bilaterally, there are no areas of significant stenosis or other impediments to vascular access in the abdomen or pelvis in this patient. 2. Cholelithiasis without evidence of acute cholecystitis at this time. 3. Additional incidental findings, as above.   Electronically Signed  By: Trudie Reed M.D.  On: 04/30/2015 12:29               Impression:  Patient has chronic persistent atrial fibrillation that has failed medical therapy on multiple agents and catheter-based ablation on 2 previous occasions, most recently in 2014. He presents with symptoms of exertional shortness of breath and fatigue consistent with chronic combined systolic and diastolic congestive heart failure, New York Heart Association functional class IIb. I have personally reviewed the patient's  recent transesophageal echocardiogram, diagnostic cardiac catheterization, and CT angiograms. 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 Cardiac gated CT angiogram of the heart  demonstrates bilateral atrial enlargement with normal pulmonary vein anatomy and no other significant structural abnormalities. CT angiogram of the aorta and iliac vessels demonstrates adequate pelvic vascular access to facilitate femoral cannulation for surgery.   Plan:  I have again reviewed the indications, risks, and potential benefits of a surgical maze procedure with the patient and his wife in the office today. Long-term treatment options have been discussed including continued medical therapy versus surgical intervention. Surgical alternatives abdomen discussed including a comparison with conventional sternotomy and minimally invasive techniques with or without use of cardiopulmonary bypass. Expectations for the patient's postoperative convalescence and long-term freedom from atrial dysrhythmias have been discussed at length. They understand and accept all potential risks of surgery including but not limited to risk of death, stroke or other neurologic complication, myocardial infarction, congestive heart failure, respiratory failure, renal failure, bleeding requiring transfusion and/or reexploration, arrhythmia, infection or other wound complications, pneumonia, pleural and/or pericardial effusion, pulmonary embolus, aortic dissection or other major vascular complication, or delayed complications related to valve repair or replacement including but not limited to structural valve deterioration and failure, thrombosis, embolization, endocarditis, or paravalvular leak. Alternative surgical approaches have been discussed including a comparison between conventional sternotomy and  minimally-invasive techniques. The relative risks and benefits of each have been reviewed as they pertain to the patient's specific circumstances, and all of their questions have been addressed. Specific risks potentially related to the minimally-invasive approach were discussed at length, including but not limited to risk of conversion to full or partial sternotomy, aortic dissection or other major vascular complication, unilateral acute lung injury or pulmonary edema, phrenic nerve dysfunction or paralysis, rib fracture, chronic pain, lung hernia, or lymphocele. All of their questions have been answered.    Salvatore Decent. Cornelius Moras, MD

## 2015-05-28 NOTE — Brief Op Note (Addendum)
05/28/2015  12:04 PM  PATIENT:  [name]  60 y.o. male  PRE-OPERATIVE DIAGNOSIS:  AFIB  POST-OPERATIVE DIAGNOSIS:  AFIB  PROCEDURE:  TRANSESOPHAGEAL ECHOCARDIOGRAM (TEE), RIGHT ANTERIOR MINI THORACOTOMY for MINIMALLY INVASIVE BILATERAL COX CRYO and BIPOLAR MAZE PROCEDURE   SURGEON:    Purcell Nails, MD  ASSISTANTS:  Ardelle Balls, PA-C  ANESTHESIA:   Corky Sox, MD  CROSSCLAMP TIME:   37'  CARDIOPULMONARY BYPASS TIME: 127'  FINDINGS:  Normal LV systolic function  Mild LV hypertrophy  Moderate bilateral atrial enlargement  Maze Procedure  Surgical Approach: Right mini thoracotomy  Cut-and-sew:  No.  Cryo: Yes  Cryo Lesions (select all that apply):         2   Box Lesion,     4  Posterior Mirtal Annular Line,     6  Mitral Valve Cryo Lesion,     9   Intercaval Line to Tricuspid Annulus ("T" Lesion) and    10  Tricuspid Cryo Lesion, Medial   16  Other - epicardial posterior AV groove and coronary sinus     Radiofrequency:  Yes.  Bipolar: Yes.  RF Lesions (select all that apply):       1   Right pulmonary Vein Isolation  9   Intercaval Line to Tricuspid Annulus ("T" Lesion),    11  Intercaval Line and   15b  RAA Lateral Wall to "T" Lesion     Left Atrial Appendage Treatment:    Yes -  endocardial oversew   COMPLICATIONS: None  BASELINE WEIGHT: 97 kg  PATIENT DISPOSITION:   TO SICU IN STABLE CONDITION  Purcell Nails, MD 05/28/2015 3:33 PM

## 2015-05-29 ENCOUNTER — Encounter (HOSPITAL_COMMUNITY): Payer: Self-pay | Admitting: Thoracic Surgery (Cardiothoracic Vascular Surgery)

## 2015-05-29 ENCOUNTER — Inpatient Hospital Stay (HOSPITAL_COMMUNITY): Payer: Managed Care, Other (non HMO)

## 2015-05-29 LAB — BASIC METABOLIC PANEL
Anion gap: 6 (ref 5–15)
BUN: 13 mg/dL (ref 6–20)
CO2: 23 mmol/L (ref 22–32)
CREATININE: 0.97 mg/dL (ref 0.61–1.24)
Calcium: 7.8 mg/dL — ABNORMAL LOW (ref 8.9–10.3)
Chloride: 106 mmol/L (ref 101–111)
GFR calc Af Amer: >60 mL/min (ref >60)
GLUCOSE: 139 mg/dL — AB (ref 65–99)
Potassium: 4.5 mmol/L (ref 3.5–5.1)
SODIUM: 135 mmol/L (ref 135–145)

## 2015-05-29 LAB — CBC
HCT: 39.8 % (ref 39.0–52.0)
HEMATOCRIT: 41.8 % (ref 39.0–52.0)
Hemoglobin: 13 g/dL (ref 13.0–17.0)
Hemoglobin: 13.5 g/dL (ref 13.0–17.0)
MCH: 29.7 pg (ref 26.0–34.0)
MCH: 29.7 pg (ref 26.0–34.0)
MCHC: 32.7 g/dL (ref 30.0–36.0)
MCHC: 32.3 g/dL (ref 30.0–36.0)
MCV: 91.1 fL (ref 78.0–100.0)
MCV: 92.1 fL (ref 78.0–100.0)
PLATELETS: 104 10*3/uL — AB (ref 150–400)
Platelets: 101 10*3/uL — ABNORMAL LOW (ref 150–400)
RBC: 4.37 MIL/uL (ref 4.22–5.81)
RBC: 4.54 MIL/uL (ref 4.22–5.81)
RDW: 14.5 % (ref 11.5–15.5)
RDW: 14.7 % (ref 11.5–15.5)
WBC: 13.2 10*3/uL — ABNORMAL HIGH (ref 4.0–10.5)
WBC: 13.3 10*3/uL — AB (ref 4.0–10.5)

## 2015-05-29 LAB — GLUCOSE, CAPILLARY
GLUCOSE-CAPILLARY: 121 mg/dL — AB (ref 65–99)
GLUCOSE-CAPILLARY: 107 mg/dL — AB (ref 65–99)
Glucose-Capillary: 113 mg/dL — ABNORMAL HIGH (ref 65–99)
Glucose-Capillary: 116 mg/dL — ABNORMAL HIGH (ref 65–99)
Glucose-Capillary: 118 mg/dL — ABNORMAL HIGH (ref 65–99)
Glucose-Capillary: 126 mg/dL — ABNORMAL HIGH (ref 65–99)
Glucose-Capillary: 135 mg/dL — ABNORMAL HIGH (ref 65–99)

## 2015-05-29 LAB — BASIC METABOLIC PANEL WITH GFR
Anion gap: 9 (ref 5–15)
BUN: 13 mg/dL (ref 6–20)
CO2: 27 mmol/L (ref 22–32)
Calcium: 8.1 mg/dL — ABNORMAL LOW (ref 8.9–10.3)
Chloride: 100 mmol/L — ABNORMAL LOW (ref 101–111)
Creatinine, Ser: 1.17 mg/dL (ref 0.61–1.24)
GFR calc Af Amer: >60 mL/min
GFR calc non Af Amer: >60 mL/min
Glucose, Bld: 122 mg/dL — ABNORMAL HIGH (ref 65–99)
Potassium: 4.3 mmol/L (ref 3.5–5.1)
Sodium: 136 mmol/L (ref 135–145)

## 2015-05-29 LAB — POCT I-STAT 3, ART BLOOD GAS (G3+)
ACID-BASE DEFICIT: 2 mmol/L (ref 0.0–2.0)
BICARBONATE: 24.4 meq/L — AB (ref 20.0–24.0)
O2 Saturation: 99 %
PCO2 ART: 47.9 mmHg — AB (ref 35.0–45.0)
PO2 ART: 131 mmHg — AB (ref 80.0–100.0)
Patient temperature: 37.2
TCO2: 26 mmol/L (ref 0–100)
pH, Arterial: 7.317 — ABNORMAL LOW (ref 7.350–7.450)

## 2015-05-29 LAB — MAGNESIUM
MAGNESIUM: 2.3 mg/dL (ref 1.7–2.4)
Magnesium: 2.1 mg/dL (ref 1.7–2.4)

## 2015-05-29 MED ORDER — SODIUM CHLORIDE 0.9% FLUSH
3.0000 mL | Freq: Two times a day (BID) | INTRAVENOUS | Status: DC
  Administered 2015-05-29 – 2015-06-02 (×8): 3 mL via INTRAVENOUS

## 2015-05-29 MED ORDER — SODIUM CHLORIDE 0.9 % IV SOLN: 250.0000 mL | INTRAVENOUS | Status: DC | PRN

## 2015-05-29 MED ORDER — POTASSIUM CHLORIDE CRYS ER 20 MEQ PO TBCR
20.0000 meq | EXTENDED_RELEASE_TABLET | Freq: Every day | ORAL | Status: DC
  Administered 2015-05-30 – 2015-06-02 (×4): 20 meq via ORAL
  Filled 2015-05-29 (×4): qty 1

## 2015-05-29 MED ORDER — MORPHINE SULFATE (PF) 2 MG/ML IV SOLN: 1.0000 mg | INTRAVENOUS | Status: DC | PRN

## 2015-05-29 MED ORDER — AMIODARONE HCL 200 MG PO TABS
200.0000 mg | ORAL_TABLET | Freq: Every day | ORAL | Status: DC
  Administered 2015-05-30 – 2015-06-02 (×4): 200 mg via ORAL
  Filled 2015-05-29 (×4): qty 1

## 2015-05-29 MED ORDER — FUROSEMIDE 40 MG PO TABS
40.0000 mg | ORAL_TABLET | Freq: Every day | ORAL | Status: DC
  Administered 2015-05-30 – 2015-05-31 (×2): 40 mg via ORAL
  Filled 2015-05-29 (×2): qty 1

## 2015-05-29 MED ORDER — ENOXAPARIN SODIUM 30 MG/0.3ML ~~LOC~~ SOLN
30.0000 mg | SUBCUTANEOUS | Status: DC
  Administered 2015-05-30 – 2015-06-02 (×4): 30 mg via SUBCUTANEOUS
  Filled 2015-05-29 (×4): qty 0.3

## 2015-05-29 MED ORDER — MOVING RIGHT ALONG BOOK
Freq: Once | Status: AC
  Administered 2015-05-29: 09:00:00
  Filled 2015-05-29: qty 1

## 2015-05-29 MED ORDER — CARVEDILOL 3.125 MG PO TABS
3.1250 mg | ORAL_TABLET | Freq: Two times a day (BID) | ORAL | Status: DC
  Administered 2015-05-29 – 2015-06-01 (×5): 3.125 mg via ORAL
  Filled 2015-05-29 (×5): qty 1

## 2015-05-29 MED ORDER — FUROSEMIDE 10 MG/ML IJ SOLN
20.0000 mg | Freq: Two times a day (BID) | INTRAMUSCULAR | Status: AC
  Administered 2015-05-29 (×2): 20 mg via INTRAVENOUS
  Filled 2015-05-29 (×2): qty 2

## 2015-05-29 MED ORDER — ROSUVASTATIN CALCIUM 10 MG PO TABS
5.0000 mg | ORAL_TABLET | Freq: Every day | ORAL | Status: DC
  Administered 2015-05-29 – 2015-06-01 (×4): 5 mg via ORAL
  Filled 2015-05-29 (×4): qty 1

## 2015-05-29 MED ORDER — SODIUM CHLORIDE 0.9% FLUSH: 3.0000 mL | INTRAVENOUS | Status: DC | PRN

## 2015-05-29 MED FILL — Sodium Bicarbonate IV Soln 8.4%: INTRAVENOUS | Qty: 50 | Status: AC

## 2015-05-29 MED FILL — Heparin Sodium (Porcine) Inj 1000 Unit/ML: INTRAMUSCULAR | Qty: 2500 | Status: AC

## 2015-05-29 MED FILL — Heparin Sodium (Porcine) Inj 1000 Unit/ML: INTRAMUSCULAR | Qty: 10 | Status: AC

## 2015-05-29 MED FILL — Mannitol IV Soln 20%: INTRAVENOUS | Qty: 500 | Status: AC

## 2015-05-29 MED FILL — Lidocaine HCl IV Inj 20 MG/ML: INTRAVENOUS | Qty: 5 | Status: AC

## 2015-05-29 MED FILL — Electrolyte-R (PH 7.4) Solution: INTRAVENOUS | Qty: 3000 | Status: AC

## 2015-05-29 MED FILL — Sodium Chloride IV Soln 0.9%: INTRAVENOUS | Qty: 2000 | Status: AC

## 2015-05-29 NOTE — Progress Notes (Signed)
Called report to 2W / [name] / pt's VS WNL upon transfer to 2W.  Advised pt is to remain on pacer at AAI  70/10/0.4.

## 2015-05-29 NOTE — Progress Notes (Addendum)
[address]       [name] [address]             [phone]        CARDIOTHORACIC SURGERY PROGRESS NOTE   R1 Day Post-Op Procedure(s) (LRB): MINIMALLY INVASIVE MAZE PROCEDURE (N/A) TRANSESOPHAGEAL ECHOCARDIOGRAM (TEE) (N/A)  Subjective: Looks good.  Minimal soreness in chest.  Objective: Vital signs: BP Readings from Last 1 Encounters:  05/29/15 115/64   Pulse Readings from Last 1 Encounters:  05/29/15 70   Resp Readings from Last 1 Encounters:  05/29/15 19   Temp Readings from Last 1 Encounters:  05/29/15 99.1 F (37.3 C)     Hemodynamics:    Physical Exam:  Rhythm:   Sinus 60 w/ some junctional rhythm - AAI pacing  Breath sounds: clear  Heart sounds:  RRR  Incisions:  Dressings dry, intact  Abdomen:  Soft, non-distended, non-tender  Extremities:  Warm, well-perfused  Chest tubes:  Low volume thin serosanguinous output, no air leak    Intake/Output from previous day: 03/30 0701 - 03/31 0700 In: 5829 [P.O.:60; I.V.:4689; Blood:420; NG/GT:60; IV Piggyback:600] Out: 4810 [Urine:3355; Blood:1000; Chest Tube:455] Intake/Output this shift: Total I/O In: 20 [I.V.:20] Out: 55 [Urine:35; Chest Tube:20]  Lab Results:  CBC: Recent Labs  05/28/15 2000 05/29/15 0430  WBC 15.5* 13.2*  HGB 14.0 13.0  HCT 42.2 39.8  PLT 111* 104*    BMET:  Recent Labs  05/28/15 1955 05/28/15 2000 05/29/15 0430  NA 139  --  135  K 4.4  --  4.5  CL 105  --  106  CO2  --   --  23  GLUCOSE 136*  --  139*  BUN 14  --  13  CREATININE 0.80 1.07 0.97  CALCIUM  --   --  7.8*     PT/INR:   Recent Labs  05/28/15 1405  LABPROT 17.0*  INR 1.37    CBG (last 3)   Recent Labs  05/28/15 1917 05/28/15 2342 05/29/15 0340  GLUCAP 122* 116* 135*    ABG    Component Value Date/Time   PHART 7.317* 05/29/2015 0419   PCO2ART 47.9* 05/29/2015 0419   PO2ART 131.0* 05/29/2015 0419   HCO3 24.4* 05/29/2015 0419   TCO2 26 05/29/2015 0419   ACIDBASEDEF 2.0 05/29/2015 0419   O2SAT 99.0 05/29/2015 0419    CXR: PORTABLE CHEST 1 VIEW  COMPARISON: Portable chest x-ray of May 28, 2015  FINDINGS: The right hemidiaphragm remains elevated as compared to the left. The lungs are reasonably well inflated. The interstitial markings remain increased predominantly on the right. There is no pneumothorax or pleural effusion. The cardiac silhouette is enlarged. The central pulmonary vascularity is prominent. The right-sided chest tube has its tip projecting over the posterior aspect of the fourth rib. There is no pneumothorax. The left internal jugular venous catheter tip projects over the junction of the left internal jugular vein with the left subclavian vein. The nasogastric tube has been removed.  IMPRESSION: Slight interval improvement in the pulmonary interstitium. Mild interstitial edema persist predominantly on the right. Stable cardiomegaly with mild central pulmonary vascular engorgement. Stable support tubes. No evidence of pneumothorax or significant pleural effusion.   Electronically Signed  By: David Swaziland M.D.  On: 05/29/2015 07:35  Assessment/Plan: S/P Procedure(s) (LRB): MINIMALLY INVASIVE MAZE PROCEDURE (N/A) TRANSESOPHAGEAL ECHOCARDIOGRAM (TEE) (N/A)  Doing well POD1 Maintaining NSR - AAI paced rhythm w/ stable hemodynamics, no drips Breathing comfortably w/ O2 sats  98% on 4 L/min via [place] Chronic diastolic CHF with expected post-op volume excess   Continue AAI pacing for now  Mobilize  Diuresis  Leave chest tubes in for now  Restart Pradaxa at hospital discharge  Restart amiodarone and carvedilol at reduced dose  Restart ACE-I in 1-2 days if BP will allow  Transfer step down   Adam Nails, MD 05/29/2015 8:55 AM

## 2015-05-29 NOTE — Anesthesia Postprocedure Evaluation (Signed)
Anesthesia Post Note  Patient: [name]  Procedure(s) Performed: Procedure(s) (LRB): MINIMALLY INVASIVE MAZE PROCEDURE (N/A) TRANSESOPHAGEAL ECHOCARDIOGRAM (TEE) (N/A)  Patient location during evaluation: ICU Anesthesia Type: General Level of consciousness: sedated Pain management: pain level controlled Vital Signs Assessment: post-procedure vital signs reviewed and stable Respiratory status: patient remains intubated per anesthesia plan Cardiovascular status: stable Postop Assessment: no signs of nausea or vomiting Anesthetic complications: no    Last Vitals:  Filed Vitals:   05/29/15 0900 05/29/15 1000  BP: 116/68 110/64  Pulse: 69 69  Temp: 37.3 C 37.6 C  Resp: 16 17    Last Pain:  Filed Vitals:   05/29/15 1056  PainSc: 0-No pain                 [name]

## 2015-05-29 NOTE — Progress Notes (Signed)
05/29/2015 1:50 PM Nursing note  Noted orders for I STAT Chem 8 at 1700, no central access for nurse draw. Orders placed for BMET via phlebotomy. Lab contacted and made aware of need for lab draw.  Sherrell Farish, Blanchard Kelch

## 2015-05-29 NOTE — Progress Notes (Signed)
Pt received from 2S from [facility]. Pt assessed and oriented to room and equipment. Pt educated on calling staff before ambulating. VS WNL. Pt pacer intact and AAI at 70. Will continue to monitor.

## 2015-05-30 ENCOUNTER — Inpatient Hospital Stay (HOSPITAL_COMMUNITY): Payer: Managed Care, Other (non HMO)

## 2015-05-30 LAB — BASIC METABOLIC PANEL
Anion gap: 7 (ref 5–15)
BUN: 14 mg/dL (ref 6–20)
CHLORIDE: 99 mmol/L — AB (ref 101–111)
CO2: 29 mmol/L (ref 22–32)
CREATININE: 1.08 mg/dL (ref 0.61–1.24)
Calcium: 8.2 mg/dL — ABNORMAL LOW (ref 8.9–10.3)
GFR calc Af Amer: >60 mL/min (ref >60)
GLUCOSE: 121 mg/dL — AB (ref 65–99)
Potassium: 3.9 mmol/L (ref 3.5–5.1)
SODIUM: 135 mmol/L (ref 135–145)

## 2015-05-30 LAB — CBC
HEMATOCRIT: 41.5 % (ref 39.0–52.0)
Hemoglobin: 13.2 g/dL (ref 13.0–17.0)
MCH: 29.5 pg (ref 26.0–34.0)
MCHC: 31.8 g/dL (ref 30.0–36.0)
MCV: 92.8 fL (ref 78.0–100.0)
PLATELETS: 109 10*3/uL — AB (ref 150–400)
RBC: 4.47 MIL/uL (ref 4.22–5.81)
RDW: 14.7 % (ref 11.5–15.5)
WBC: 13.8 10*3/uL — AB (ref 4.0–10.5)

## 2015-05-30 NOTE — Progress Notes (Signed)
CARDIAC REHAB PHASE I   PRE:  Rate/Rhythm: 78 sinus  BP:  Supine:   Sitting:   Standing: 114/81   SaO2: 93 RA  MODE:  Ambulation: 250 ft   POST:  Rate/Rhythem: 74 sinus  BP:  Supine:   Sitting: 121/73  Standing:    SaO2:   Pt ambulated 250 ft with assist x1 using rolling walker.  Pt tolerated walk well, stated that he was tired and this was his third walk today.  He had been up to bathroom and was able to pass gas but no BM yet.  Pt was returned to chair with call bell in reach and VSS.  Wife and visitors were in room.  Pt was encouraged to continue to walk over weekend.  We will f/u on Monday. Fabio Pierce, MA, ACSM RCEP [phone]  Hazle Nordmann

## 2015-05-30 NOTE — Progress Notes (Addendum)
      [address]       [name] [address]             [phone]      2 Days Post-Op Procedure(s) (LRB): MINIMALLY INVASIVE MAZE PROCEDURE (N/A) TRANSESOPHAGEAL ECHOCARDIOGRAM (TEE) (N/A)   Subjective:  [name] has no complaints this morning.  Objective: Vital signs in last 24 hours: Temp:  [98.4 F (36.9 C)-99.9 F (37.7 C)] 98.4 F (36.9 C) (04/01 0557) Pulse Rate:  [69-76] 76 (04/01 0557) Cardiac Rhythm:  [-] Atrial paced;Normal sinus rhythm (04/01 0737) Resp:  [14-25] 20 (04/01 0557) BP: (99-124)/(57-73) 113/67 mmHg (04/01 0557) SpO2:  [90 %-100 %] 90 % (04/01 0557) Weight:  [210 lb (95.255 kg)] 210 lb (95.255 kg) (04/01 0557)  Intake/Output from previous day: 03/31 0701 - 04/01 0700 In: 360 [P.O.:340; I.V.:20] Out: 2145 [Urine:1805; Chest Tube:340]  General appearance: alert, cooperative and no distress Heart: regular rate and rhythm Lungs: clear to auscultation bilaterally Abdomen: soft, non-tender; bowel sounds normal; no masses,  no organomegaly Extremities: edema trace Wound: clean and dry  Lab Results:  Recent Labs  05/29/15 1639 05/30/15 0302  WBC 13.3* 13.8*  HGB 13.5 13.2  HCT 41.8 41.5  PLT 101* 109*   BMET:  Recent Labs  05/29/15 1639 05/30/15 0302  NA 136 135  K 4.3 3.9  CL 100* 99*  CO2 27 29  GLUCOSE 122* 121*  BUN 13 14  CREATININE 1.17 1.08  CALCIUM 8.1* 8.2*    PT/INR:  Recent Labs  05/28/15 1405  LABPROT 17.0*  INR 1.37   ABG    Component Value Date/Time   PHART 7.317* 05/29/2015 0419   HCO3 24.4* 05/29/2015 0419   TCO2 26 05/29/2015 0419   ACIDBASEDEF 2.0 05/29/2015 0419   O2SAT 99.0 05/29/2015 0419   CBG (last 3)   Recent Labs  05/29/15 0737 05/29/15 1121 05/29/15 1539  GLUCAP 107* 118* 126*    Assessment/Plan: S/P Procedure(s) (LRB): MINIMALLY INVASIVE MAZE PROCEDURE (N/A) TRANSESOPHAGEAL ECHOCARDIOGRAM (TEE) (N/A)  1. CV- NSR, put pacer on back up 60- continue Amiodarone,  Coreg 2. Pulm- wean oxygen as tolerated, CXR with bilateral atelectasis, patient not using incentive instructed on importance of use 3. Renal- creatinine WNL, mild hypervolemia, continue lasix 4. Chest tube 340 cc output yesterday ( level currently at 750)- will leave today 5. Expected post operative blood loss anemia, mild Hgb at 13.2 6. cbgs controlled, not a diabetic will d/c SSIP 7. Dispo- patient stable, continue current care, leave chest tubes today  LOS: 2 days    [name] 05/30/2015  Patient seen and examined, agree with above BP still on low side hold off on ACE-I for now NOAC at [employer] C. Dorris Fetch, MD [facility] [phone]

## 2015-05-31 ENCOUNTER — Inpatient Hospital Stay (HOSPITAL_COMMUNITY): Payer: Managed Care, Other (non HMO)

## 2015-05-31 IMAGING — CR DG CHEST 1V PORT
1 series · 1 of 1 positions shown · non-contrast
Comparison: [DATE]

CLINICAL DATA: Status post Maze procedure.

EXAM:
PORTABLE CHEST 1 VIEW

[AP]
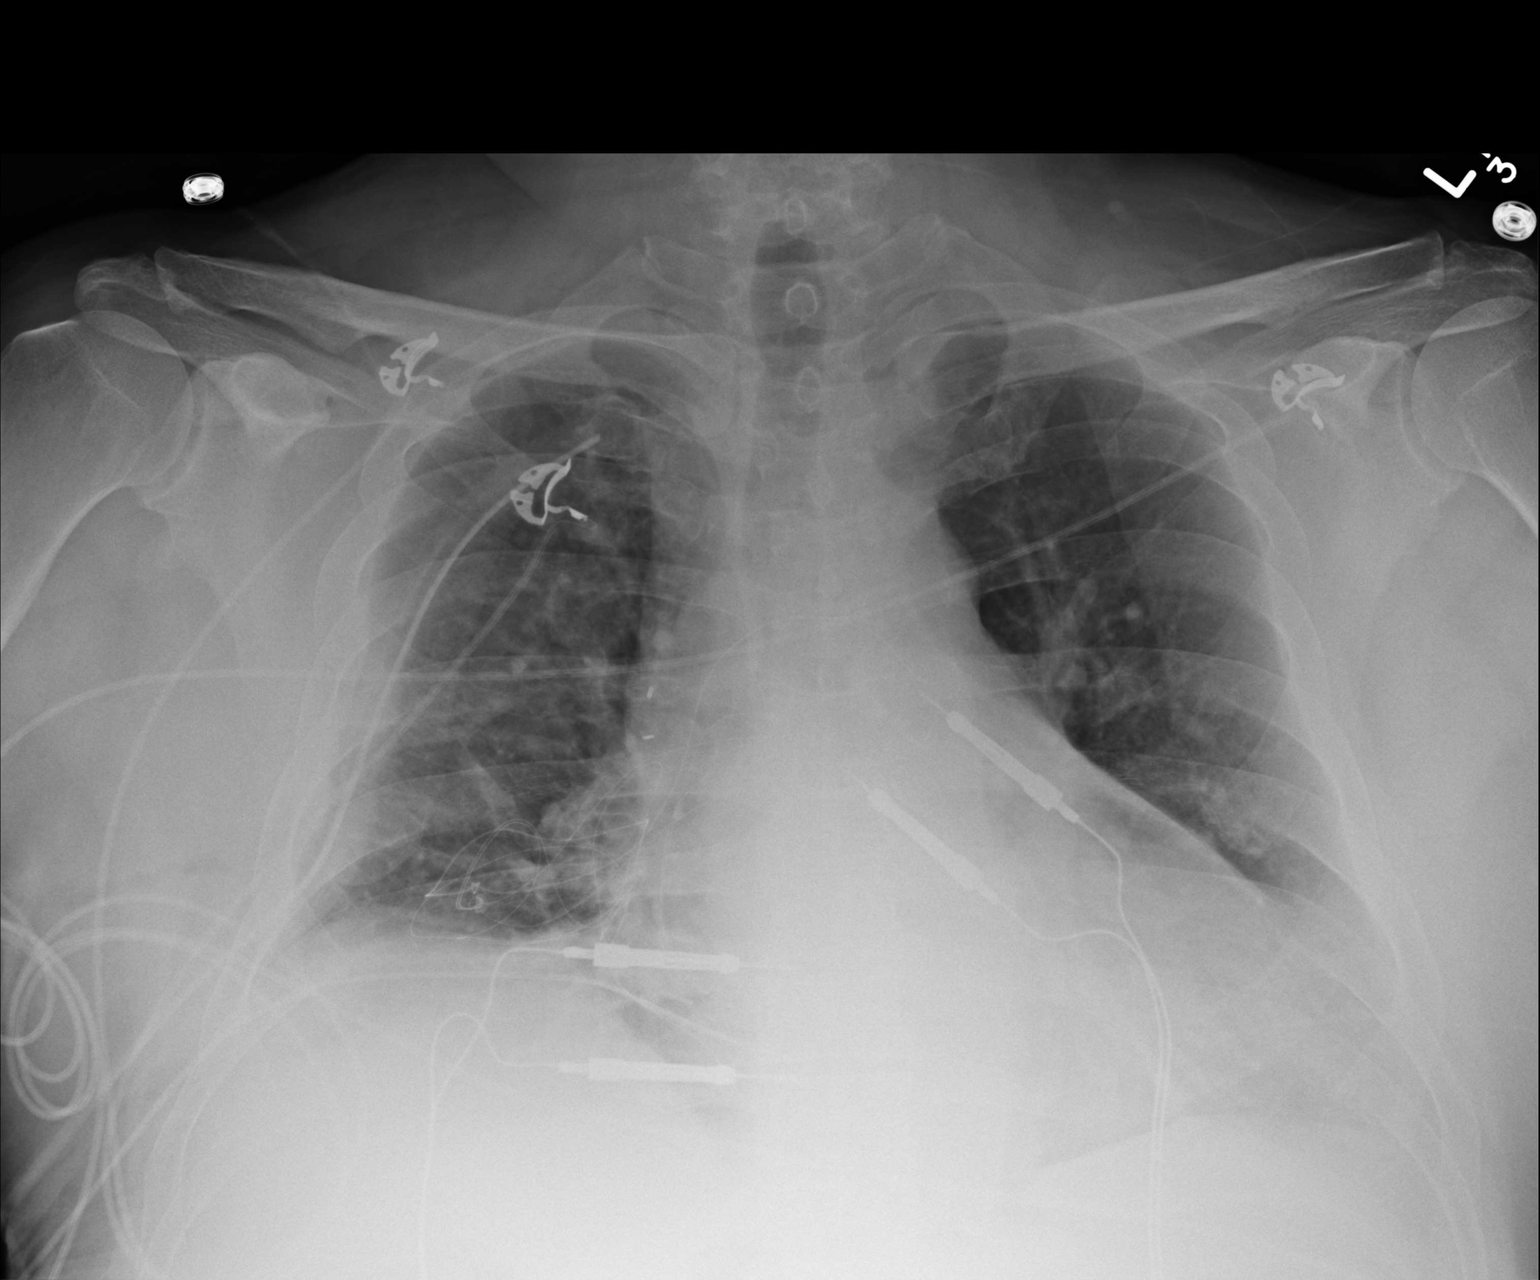

[1 of 1 positions shown; findings below may reference images not displayed]

FINDINGS: Cardiomegaly, right thoracostomy tube and pacing wires again noted.

Mild pulmonary vascular congestion and mild bibasilar atelectasis
again noted.

There is no evidence of pneumothorax.

There has been little change since prior study.
IMPRESSION: Unchanged appearance of the chest.  No evidence of pneumothorax.

## 2015-05-31 MED ORDER — FUROSEMIDE 40 MG PO TABS
40.0000 mg | ORAL_TABLET | Freq: Two times a day (BID) | ORAL | Status: AC
  Administered 2015-05-31 – 2015-06-01 (×3): 40 mg via ORAL
  Filled 2015-05-31 (×3): qty 1

## 2015-05-31 NOTE — Progress Notes (Signed)
Patient walked 700 feet with RN using front wheel walker on room air tolerated very well.

## 2015-05-31 NOTE — Progress Notes (Signed)
05/31/2015 11:46 AM Chest tubes removed per order and policy.  Pt tolerated well. [name]

## 2015-05-31 NOTE — Progress Notes (Signed)
      [address]       [name] [address]             [phone]      3 Days Post-Op Procedure(s) (LRB): MINIMALLY INVASIVE MAZE PROCEDURE (N/A) TRANSESOPHAGEAL ECHOCARDIOGRAM (TEE) (N/A)   Subjective:  [name] states he is doing okay.  He feels a little better than yesterday.  + ambulation  No BM  Objective: Vital signs in last 24 hours: Temp:  [98 F (36.7 C)-98.8 F (37.1 C)] 98.8 F (37.1 C) (04/02 0305) Pulse Rate:  [65-67] 67 (04/02 0305) Cardiac Rhythm:  [-] Normal sinus rhythm;Atrial paced (04/01 2025) Resp:  [18-20] 18 (04/02 0305) BP: (107-110)/(67-70) 107/70 mmHg (04/02 0305) SpO2:  [93 %-96 %] 96 % (04/02 0305) Weight:  [207 lb 6.4 oz (94.076 kg)] 207 lb 6.4 oz (94.076 kg) (04/02 0428)  Intake/Output from previous day: 04/01 0701 - 04/02 0700 In: 206 [P.O.:200; I.V.:6] Out: 215 [Urine:125; Chest Tube:90] Intake/Output this shift: Total I/O In: -  Out: 80 [Chest Tube:80]  General appearance: alert, cooperative and no distress Heart: regular rate and rhythm Lungs: clear to auscultation bilaterally Abdomen: soft, non-tender; bowel sounds normal; no masses,  no organomegaly Extremities: edema none appreciated Wound: clean and dry  Lab Results:  Recent Labs  05/29/15 1639 05/30/15 0302  WBC 13.3* 13.8*  HGB 13.5 13.2  HCT 41.8 41.5  PLT 101* 109*   BMET:  Recent Labs  05/29/15 1639 05/30/15 0302  NA 136 135  K 4.3 3.9  CL 100* 99*  CO2 27 29  GLUCOSE 122* 121*  BUN 13 14  CREATININE 1.17 1.08  CALCIUM 8.1* 8.2*    PT/INR:  Recent Labs  05/28/15 1405  LABPROT 17.0*  INR 1.37   ABG    Component Value Date/Time   PHART 7.317* 05/29/2015 0419   HCO3 24.4* 05/29/2015 0419   TCO2 26 05/29/2015 0419   ACIDBASEDEF 2.0 05/29/2015 0419   O2SAT 99.0 05/29/2015 0419   CBG (last 3)   Recent Labs  05/29/15 0737 05/29/15 1121 05/29/15 1539  GLUCAP 107* 118* 126*    Assessment/Plan: S/P Procedure(s)  (LRB): MINIMALLY INVASIVE MAZE PROCEDURE (N/A) TRANSESOPHAGEAL ECHOCARDIOGRAM (TEE) (N/A)  1. CV- Sinus Brady, HR in the 60s- on Amiodarone and Coreg, BP low 100s, will continue to hold ACE for now 2. Chest tube- 170 cc output yesterday, will d/c today 3. Pulm- not on oxygen, better use of IS yesterday, encouraged continued use 4. Renal- weight is below baseline, currently on lasix, no significant LE edema present, may be able to stop prior to discharge 5. Dispo- patient stable, will d/c chest tubes, sinus brady on amiodarone, coreg... Continue to hold ACE for now   LOS: 3 days    Raford Pitcher, Denny Peon 05/31/2015

## 2015-05-31 NOTE — Progress Notes (Signed)
05/31/2015 1400  Pt walked 450 ft without a walker and on RA.  Pt's wife walked with him.  He tolerated the walk well. [name]

## 2015-06-01 ENCOUNTER — Inpatient Hospital Stay (HOSPITAL_COMMUNITY): Payer: Managed Care, Other (non HMO)

## 2015-06-01 MED ORDER — CARVEDILOL 12.5 MG PO TABS
12.5000 mg | ORAL_TABLET | Freq: Two times a day (BID) | ORAL | Status: DC
Start: 2015-06-01 — End: 2015-06-01

## 2015-06-01 MED ORDER — DILTIAZEM HCL ER COATED BEADS 120 MG PO CP24
120.0000 mg | ORAL_CAPSULE | Freq: Every day | ORAL | Status: DC
  Administered 2015-06-01 – 2015-06-02 (×2): 120 mg via ORAL
  Filled 2015-06-01 (×2): qty 1

## 2015-06-01 MED ORDER — CARVEDILOL 6.25 MG PO TABS
6.2500 mg | ORAL_TABLET | Freq: Two times a day (BID) | ORAL | Status: DC
  Administered 2015-06-01 – 2015-06-02 (×2): 6.25 mg via ORAL
  Filled 2015-06-01 (×2): qty 1

## 2015-06-01 MED ORDER — CARVEDILOL 3.125 MG PO TABS: 3.1250 mg | ORAL_TABLET | Freq: Once | ORAL | Status: DC

## 2015-06-01 MED ORDER — CARVEDILOL 6.25 MG PO TABS
6.2500 mg | ORAL_TABLET | Freq: Once | ORAL | Status: AC
  Administered 2015-06-01: 6.25 mg via ORAL
  Filled 2015-06-01: qty 1

## 2015-06-01 NOTE — Progress Notes (Signed)
CARDIAC REHAB PHASE I   PRE:  Rate/Rhythm: 103 ST    BP: sitting 108/77    SaO2: 95 RA  MODE:  Ambulation: 890 ft   POST:  Rate/Rhythm: 107 afib    BP: sitting 129/85     SaO2: 99-100 RA  Tolerated well. Did not need RW. Went into afib with controlled rate, no sx. Sts his legs were getting fatigued toward end of long walk. To BR then recliner. Encouraged more walking today.  [phone]   Harriet Masson CES, ACSM 06/01/2015 1:32 PM

## 2015-06-01 NOTE — Progress Notes (Signed)
      [address]       [name] [address]             [phone]     CARDIOTHORACIC SURGERY PROGRESS NOTE  4 Days Post-Op  S/P Procedure(s) (LRB): MINIMALLY INVASIVE MAZE PROCEDURE (N/A) TRANSESOPHAGEAL ECHOCARDIOGRAM (TEE) (N/A)  Subjective: Feels well.  Minimal soreness.  No SOB.  No dizziness w/ rhythm changes  Objective: Vital signs in last 24 hours: Temp:  [98 F (36.7 C)-98.2 F (36.8 C)] 98 F (36.7 C) (04/03 0413) Pulse Rate:  [59-63] 62 (04/03 0413) Cardiac Rhythm:  [-] Atrial paced (04/03 0413) Resp:  [17-18] 18 (04/03 0413) BP: (105-127)/(66-71) 105/66 mmHg (04/03 0413) SpO2:  [97 %] 97 % (04/03 0413) Weight:  [204 lb 1.6 oz (92.579 kg)] 204 lb 1.6 oz (92.579 kg) (04/03 0414)  Physical Exam:  Rhythm:   In and out of Afib/Aflutter w/ HR 110-130  Breath sounds: clear  Heart sounds:  RRR  Incisions:  Clean and dry  Abdomen:  Soft, non-distended, non-tender  Extremities:  Warm, well-perfused   Intake/Output from previous day: 04/02 0701 - 04/03 0700 In: 483 [P.O.:480; I.V.:3] Out: 230 [Urine:150; Chest Tube:80] Intake/Output this shift:    Lab Results:  Recent Labs  05/29/15 1639 05/30/15 0302  WBC 13.3* 13.8*  HGB 13.5 13.2  HCT 41.8 41.5  PLT 101* 109*   BMET:  Recent Labs  05/29/15 1639 05/30/15 0302  NA 136 135  K 4.3 3.9  CL 100* 99*  CO2 27 29  GLUCOSE 122* 121*  BUN 13 14  CREATININE 1.17 1.08  CALCIUM 8.1* 8.2*    CBG (last 3)   Recent Labs  05/29/15 1121 05/29/15 1539  GLUCAP 118* 126*   PT/INR:  No results for input(s): LABPROT, INR in the last 72 hours.  CXR:  CHEST 2 VIEW  COMPARISON: 05/31/2015  FINDINGS: Right chest tube removed. Possible small right basilar pneumothorax. No significant apical component to the pneumothorax.  Bibasilar atelectasis unchanged. Negative for heart failure or edema. Small right effusion. Temporary pacer wires unchanged  IMPRESSION: Right chest tube  removal. Possible small right basilar pneumothorax.  Bibasilar atelectasis unchanged.   Electronically Signed  By: Marlan Palau M.D.  On: 06/01/2015 07:52   Assessment/Plan: S/P Procedure(s) (LRB): MINIMALLY INVASIVE MAZE PROCEDURE (N/A) TRANSESOPHAGEAL ECHOCARDIOGRAM (TEE) (N/A)  Doing well but now having bursts of Afib/Aflutter since beta blocker put on hold over weekend   Will resume Coreg and increase to 6.25 bid, add low dose Cardizem CD  Continue amiodarone  D/C pacing wires  Resume Pradaxa once pacing wires out  Mobilize  Recheck electrolytes   Purcell Nails, MD 06/01/2015 8:05 AM

## 2015-06-01 NOTE — Progress Notes (Signed)
06/01/2015 10:31 AM EPW D/C'd per order and per protocol.  Ends intact. Pt. Tolerated well.  Advised bedrest x1hr.  Call bell in reach.  Vital signs collected per protocol. [name]

## 2015-06-02 LAB — MAGNESIUM: Magnesium: 2 mg/dL (ref 1.7–2.4)

## 2015-06-02 LAB — CBC
HCT: 41.4 % (ref 39.0–52.0)
Hemoglobin: 13.4 g/dL (ref 13.0–17.0)
MCH: 29.8 pg (ref 26.0–34.0)
MCHC: 32.4 g/dL (ref 30.0–36.0)
MCV: 92 fL (ref 78.0–100.0)
PLATELETS: 165 10*3/uL (ref 150–400)
RBC: 4.5 MIL/uL (ref 4.22–5.81)
RDW: 14.8 % (ref 11.5–15.5)
WBC: 8.7 10*3/uL (ref 4.0–10.5)

## 2015-06-02 LAB — BASIC METABOLIC PANEL
ANION GAP: 13 (ref 5–15)
BUN: 21 mg/dL — ABNORMAL HIGH (ref 6–20)
CALCIUM: 8.6 mg/dL — AB (ref 8.9–10.3)
CO2: 29 mmol/L (ref 22–32)
CREATININE: 1.19 mg/dL (ref 0.61–1.24)
Chloride: 93 mmol/L — ABNORMAL LOW (ref 101–111)
GFR calc Af Amer: >60 mL/min (ref >60)
GLUCOSE: 118 mg/dL — AB (ref 65–99)
Potassium: 3.8 mmol/L (ref 3.5–5.1)
Sodium: 135 mmol/L (ref 135–145)

## 2015-06-02 MED ORDER — POTASSIUM CHLORIDE CRYS ER 20 MEQ PO TBCR
20.0000 meq | EXTENDED_RELEASE_TABLET | Freq: Once | ORAL | Status: AC
  Administered 2015-06-02: 20 meq via ORAL
  Filled 2015-06-02: qty 1

## 2015-06-02 MED ORDER — AMIODARONE HCL 200 MG PO TABS: 200.0000 mg | ORAL_TABLET | Freq: Every day | ORAL | Status: DC

## 2015-06-02 MED ORDER — CARVEDILOL 12.5 MG PO TABS: 12.5000 mg | ORAL_TABLET | Freq: Two times a day (BID) | ORAL | Status: DC

## 2015-06-02 MED ORDER — DILTIAZEM HCL ER COATED BEADS 120 MG PO CP24: 120.0000 mg | ORAL_CAPSULE | Freq: Every day | ORAL | Status: DC

## 2015-06-02 MED ORDER — OXYCODONE HCL 5 MG PO TABS: 5.0000 mg | ORAL_TABLET | ORAL | Status: DC | PRN

## 2015-06-02 MED ORDER — CARVEDILOL 12.5 MG PO TABS
12.5000 mg | ORAL_TABLET | Freq: Two times a day (BID) | ORAL | Status: DC
  Administered 2015-06-02: 6.25 mg via ORAL
  Filled 2015-06-02: qty 1

## 2015-06-02 NOTE — Discharge Instructions (Signed)
Thoracotomy, Care After °Refer to this sheet in the next few weeks. These instructions provide you with information on caring for yourself after your procedure. Your health care provider may also give you more specific instructions. Your treatment has been planned according to current medical practices, but problems sometimes occur. Call your health care provider if you have any problems or questions after your procedure. °WHAT TO EXPECT AFTER YOUR PROCEDURE °After your procedure, it is typical to have the following sensations: °· You may feel pain at the incision site. °· You may be constipated from the pain medicine given and the change in your level of activity. °· You may feel extremely tired. °HOME CARE INSTRUCTIONS °· Take over-the-counter or prescription medicines for pain, discomfort, or fever only as directed by your health care provider. It is very important to take pain relieving medicine before your pain becomes severe. You will be able to breathe and cough more comfortably if your pain is well controlled. °· Take deep breaths. Deep breathing helps to keep your lungs inflated and protects against a lung infection (pneumonia). °· Cough frequently. Even though coughing may cause discomfort, coughing is important to clear mucus (phlegm) and expand your lungs. Coughing helps prevent pneumonia. If it hurts to cough, hold a pillow against your chest when you cough. This may help with the discomfort. °· Continue to use an incentive spirometer as directed. The use of an incentive spirometer helps to keep your lungs inflated and protects against pneumonia. °· Change the bandages over your incision as needed or as directed by your health care provider. °· Remove the bandages over your chest tube site as directed by your health care provider. °· Resume your normal diet as directed. It is important to have adequate protein, calories, vitamins, and minerals to promote healing. °· Prevent constipation. °¨ Eat  high-fiber foods such as whole grain cereals and breads, brown rice, beans, and fresh fruits and vegetables. °¨ Drink enough water and fluids to keep your urine clear or pale yellow. Avoid drinking beverages containing caffeine. Beverages containing caffeine can cause dehydration and harden your stool. °¨ Talk to your health care provider about taking a stool softener or laxative. °· Avoid lifting until you are instructed otherwise. °· Do not drive until directed by your health care provider.  Do not drive while taking pain medicines (narcotics). °· Do not bathe, swim, or use a hot tub until directed by your health care provider. You may shower instead. Gently wash the area of your incision with water and soap as directed. Do not use anything else to clean your incision except as directed by your health care provider. °· Do not use any tobacco products including cigarettes, chewing tobacco, or electronic cigarettes. °· Avoid secondhand smoke. °· Schedule an appointment for stitch (suture) or staple removal as directed. °· Schedule and attend all follow-up visits as directed by your health care provider. It is important to keep all your appointments. °· Participate in pulmonary rehabilitation as directed by your health care provider. °· Do not travel by airplane for 2 weeks after your chest tube is removed. °SEEK MEDICAL CARE IF: °· You are bleeding from your wounds. °· Your heartbeat seems irregular. °· You have redness, swelling, or increasing pain in the wounds. °· There is pus coming from your wounds. °· There is a bad smell coming from the wound or dressing. °· You have a fever or chills. °· You have nausea or are vomiting. °· You have muscle aches. °SEEK   IMMEDIATE MEDICAL CARE IF: °· You have a rash. °· You have difficulty breathing. °· You have a reaction or side effect to medicines given. °· You have persistent nausea. °· You have lightheadedness or feel faint. °· You have shortness of breath or chest  pain. °· You have persistent pain. °  °This information is not intended to replace advice given to you by your health care provider. Make sure you discuss any questions you have with your health care provider. °  °Document Released: 07/30/2010 Document Revised: 07/01/2014 Document Reviewed: 10/03/2012 °Elsevier Interactive Patient Education ©2016 Elsevier Inc. ° °

## 2015-06-02 NOTE — Discharge Summary (Signed)
Physician Discharge Summary       [address] [place]       [name] [address]             [phone]    Patient ID: [name] MRN: [id number] DOB/AGE: 09-12-1955 60 y.o.  Admit date: 05/28/2015 Discharge date: 06/02/2015  Admission Diagnoses: Recurrent persistent atrial fibrillation [facility]) s/p afib ablation 02/13/10 and redo ablation 10/26/12 Active Diagnoses:  1. Chronic systolic dysfunction of left ventricle 2. Sinus bradycardia 3. Essential hypertension 4. Congestive dilated cardiomyopathy (HCC) 5. Chronic systolic (congestive) heart failure (HCC) 6. Obesity 7. Non-ischemic cardiomyopathy (HCC) 8. Atrial flutter (HCC)-s/p CTI ablation 2006 by Ladona Ridgel   Procedure (s):   Minimally-Invasive Maze Procedure Complete bilateral atrial lesion set using cryothermy and bipolar radiofrequency ablation Oversewing of Left Atrial Appendage by Dr. Cornelius Moras on 05/28/2015.  History of Presenting Illness:  Patient is a 60 year old moderately obese male with history of rheumatic fever during childhood, nonischemic cardiomyopathy, chronic systolic congestive heart failure, chronic persistent atrial fibrillation that has failed medical therapy with numerous agents and catheter-based atrial fibrillation ablation on 2 previous occasions, and OSA who has been referred for possible maze procedure. The patient states that he was first diagnosed with atrial fibrillation in April 2003. He initially did well on medical therapy but he developed atrial flutter and underwent catheter-based ablation by Dr. Ladona Ridgel in 2006. He later developed persistent atrial fibrillation which has failed medical therapy with both amiodarone and Tikosyn. He states that he did well on amiodarone for a prolonged period of time, but he eventually developed blue-gray skin discoloration. He is undergone cardioversion on numerous occasions and catheter based ablation for atrial fibrillation  on 2 previous occasions including 2011 and again in 2014. Following each ablation the patient initially maintained sinus rhythm for a period of time. After his most recent ablation in 2014 he did well until May 2016 when he had a brief recurrence of atrial fibrillation. Beginning in November 2016 the patient has had increasing episodes of paroxysmal atrial fibrillation, and by the end of December he was staying in persistent atrial fibrillation. He was seen recently in follow-up by Dr. Johney Frame. Transesophageal echocardiogram was performed and notable for the absence of signs of left atrial thrombus. Left ventricular ejection fraction was estimated 35-40% which was somewhat increased in comparison with his last previous transthoracic echocardiogram performed in 2014. The patient has moderate left and right atrial enlargement but does not have any significant valvular disease. Diagnostic cardiac catheterization was performed by Dr. Tresa Endo and notable for the presence of mild nonobstructive coronary artery disease. The patient was referred for surgical consultation to discuss possible maze procedure.  Patient is married and lives locally in [place] with his wife and mother-in-law. He works full-time for [employer]. The patient states that when he was younger he exercised routinely and was quite fit. He has experienced exertional fatigue and shortness of breath ever since he first developed atrial fibrillation in 2003. As he has become less physically active yet gained some weight including tended 12 pounds over the past year. He has been dealing with atrial fibrillation for quite some time and can tell when he is out of rhythm. For the past several weeks he has had exertional shortness of breath. Approximately 2 weeks ago his resting pulse intermittently dropped to below 40, causing him increased shortness of breath and frequent dizzy spells without syncope. His pulse rate has improved over  the last week or 2. He  gets short of breath with more strenuous activity and gets tired in the afternoons. He denies any resting shortness of breath, PND, orthopnea, or lower extremity edema. He has never had any chest pain or chest tightness either with activity or at rest. He otherwise reports no other significant physical problems. He enjoys bowling on a regular basis and participates in national bowling tournaments. The patient has been chronically anticoagulated off and on through the years, most recently using Pradaxa.   Patient returns to the office today with tentative plans to proceed with minimally invasive maze procedure later this week. He was last seen here in the office on 05/04/2015. He reports no new problems or complaints over the last few weeks. He continues to go in and out of atrial fibrillation. His resting pulse has been running a bit slower, typically between 40 and 55 bpm. He states that he still has periods where it goes faster while he is in atrial fibrillation. He has not had any dizzy spells. He stopped taking Pradaxa in anticipation of surgery.  Dr. Cornelius Moras have again reviewed the indications, risks, and potential benefits of a surgical maze procedure with the patient and his wife in the office today. Long-term treatment options have been discussed including continued medical therapy versus surgical intervention. Surgical alternatives abdomen discussed including a comparison with conventional sternotomy and minimally invasive techniques with or without use of cardiopulmonary bypass. Expectations for the patient's postoperative convalescence and long-term freedom from atrial dysrhythmias have been discussed at length.He presented on 05/28/2015 in order to undergo a minimally invasive Maze procedure.  Brief Hospital Course:  He was extubated the evening of surgery without difficulty. Patient has remained afebrile and hemodynamically stable. He was initially AAI paced. Chest tube  output gradually decrease. Chest tubes were removed on 05/31/2015. He was restarted on Amiodarone and Coreg. Coreg was titrated accordingly. Epicardial pacing wires were removed on 04/03. He was started on Cardizem CD for better rate contol.  He is ambulating on room air. He is tolerating a diet.  He is tachycardic this am so his Coreg was increased to 12.5 mg bid. He will be restarted on Pradaxa at dischargePer Dr. Cornelius Moras, he is stable for discharge today.  Latest Vital Signs: Blood pressure 103/75, pulse 106, temperature 98.2 F (36.8 C), temperature source Oral, resp. rate 18, height  (1.702 m), weight 201 lb 1 oz (91.2 kg), SpO2 98 %.  Physical Exam: Cardiovascular: Tachycardic Pulmonary: Clear to auscultation bilaterally; no rales, wheezes, or rhonchi. Abdomen: Soft, non tender, some distention, bowel sounds present. Extremities: No lower extremity edema. Wounds: Clean and dry. No erythema or signs of infection.  Discharge Condition:Stable and discharged to home  Recent laboratory studies:  Lab Results  Component Value Date   WBC 8.7 06/02/2015   HGB 13.4 06/02/2015   HCT 41.4 06/02/2015   MCV 92.0 06/02/2015   PLT 165 06/02/2015   Lab Results  Component Value Date   NA 135 06/02/2015   K 3.8 06/02/2015   CL 93* 06/02/2015   CO2 29 06/02/2015   CREATININE 1.19 06/02/2015   GLUCOSE 118* 06/02/2015    Diagnostic Studies: Dg Chest 2 View  06/01/2015  CLINICAL DATA:  Chest tube removal EXAM: CHEST  2 VIEW COMPARISON:  05/31/2015 FINDINGS: Right chest tube removed. Possible small right basilar pneumothorax. No significant apical component to the pneumothorax. Bibasilar atelectasis unchanged. Negative for heart failure or edema. Small right effusion. Temporary pacer wires unchanged IMPRESSION: Right chest tube removal. Possible small  right basilar pneumothorax. Bibasilar atelectasis unchanged. Electronically Signed   By: Marlan Palau M.D.   On: 06/01/2015 07:52         Discharge Instructions    Amb Referral to Cardiac Rehabilitation    Complete by:  As directed   Diagnosis:  Other Comment - min invasive MAZE for a fib         Discharge Medications:   Medication List    STOP taking these medications        ramipril 10 MG capsule  Commonly known as:  ALTACE      TAKE these medications        amiodarone 200 MG tablet  Commonly known as:  PACERONE  Take 1 tablet (200 mg total) by mouth daily.     carvedilol 12.5 MG tablet  Commonly known as:  COREG  Take 1 tablet (12.5 mg total) by mouth 2 (two) times daily with a meal.     dabigatran 150 MG Caps capsule  Commonly known as:  PRADAXA  Take 150 mg by mouth 2 (two) times daily. Reported on 05/25/2015     diltiazem 120 MG 24 hr capsule  Commonly known as:  CARDIZEM CD  Take 1 capsule (120 mg total) by mouth daily.     oxyCODONE 5 MG immediate release tablet  Commonly known as:  Oxy IR/ROXICODONE  Take 1-2 tablets (5-10 mg total) by mouth every 4 (four) hours as needed for severe pain.     rosuvastatin 5 MG tablet  Commonly known as:  CRESTOR  Take 1 tablet (5 mg total) by mouth daily.       The patient has been discharged on:   1.Beta Blocker:  Yes [  x ]                              No   [   ]                              If No, reason:  2.Ace Inhibitor/ARB: Yes [   ]                                     No  [   x ]                                     If No, reason:Labile BP  3.Statin:   Yes [  x ]                  No  [   ]                  If No, reason:  4.Ecasa:  Yes  [   ]                  No   [  x ]                  If No, reason:No CAD Follow Up Appointments: Follow-up Information    Follow up with Purcell Nails, MD On 06/15/2015.   Specialty:  Cardiothoracic Surgery   Why:  PA/LAT CXR to be taken (at [facility] which is  in the same building as Dr. Orvan July office) on 06/15/2015 at 11:15 am;Appointment time is at 12:00 pm   Contact information:   [address]  [employer] [address] [place] [address] [phone]       Follow up with Hillis Range, MD.   Specialty:  Cardiology   Why:  Call for a follow up appointment in about 1 month   Contact information:   [address] [place] [address] [phone]       Signed: Doree Fudge MPA-C 06/02/2015, 10:45 AM

## 2015-06-02 NOTE — Progress Notes (Addendum)
      [address]       [name] [address]             [phone]        5 Days Post-Op Procedure(s) (LRB): MINIMALLY INVASIVE MAZE PROCEDURE (N/A) TRANSESOPHAGEAL ECHOCARDIOGRAM (TEE) (N/A)  Subjective: Patient without specific complaints. He states his Coreg needs to be increased for fast heart rate, as he has taken this medication for 13 years.  Objective: Vital signs in last 24 hours: Temp:  [97.7 F (36.5 C)-98.2 F (36.8 C)] 98.2 F (36.8 C) (04/04 0440) Pulse Rate:  [71-111] 106 (04/04 0440) Cardiac Rhythm:  [-] Sinus tachycardia (04/03 2053) Resp:  [18-20] 18 (04/04 0440) BP: (96-112)/(68-84) 104/77 mmHg (04/04 0440) SpO2:  [97 %-100 %] 98 % (04/04 0440) Weight:  [201 lb 1 oz (91.2 kg)] 201 lb 1 oz (91.2 kg) (04/04 0445)  Pre op weight 97 kg Current Weight  06/02/15 201 lb 1 oz (91.2 kg)      Intake/Output from previous day: 04/03 0701 - 04/04 0700 In: 723 [P.O.:720; I.V.:3] Out: 350 [Urine:350]   Physical Exam:  Cardiovascular: Tachycardic Pulmonary: Clear to auscultation bilaterally; no rales, wheezes, or rhonchi. Abdomen: Soft, non tender, some distention, bowel sounds present. Extremities: No lower extremity edema. Wounds: Clean and dry.  No erythema or signs of infection.  Lab Results: CBC: Recent Labs  06/02/15 0253  WBC 8.7  HGB 13.4  HCT 41.4  PLT 165   BMET:  Recent Labs  06/02/15 0253  NA 135  K 3.8  CL 93*  CO2 29  GLUCOSE 118*  BUN 21*  CREATININE 1.19  CALCIUM 8.6*    PT/INR:  Lab Results  Component Value Date   INR 1.37 05/28/2015   INR 1.07 05/25/2015   INR 2.3* 05/31/2010   ABG:  INR: Will add last result for INR, ABG once components are confirmed Will add last 4 CBG results once components are confirmed  Assessment/Plan:  1. CV -  Previous A fib. ST with HR in the low 100's this am. On Amiodarone 200 mg daily, Coreg increased to 6.25 mg bid, and Cardizem CD 120 mg daily. Likely restart  Pradaxa at discharge. 2.  Pulmonary - On room air. 3. Supplement potassium   ZIMMERMAN,DONIELLE MPA-C 06/02/2015,8:03 AM  I have seen and examined the patient and agree with the assessment and plan as outlined.  Doing well.  Maintaining NSR w/ HR 100-110.  Increase Coreg to 12.5 mg bid and d/c home today.  Continue to hold ACE-I and resume as outpatient if BP increases  Purcell Nails, MD 06/02/2015 10:23 AM

## 2015-06-02 NOTE — Progress Notes (Signed)
CARDIAC REHAB PHASE I   Pt ambulating independently with no complaints. Cardiac surgery discharge education completed with pt at bedside. Reviewed IS, activity restrictions, exercise guidelines, heart healthy diet, sodium restrictions, daily weights, s/s heart failure, and phase 2 cardiac rehab. Pt may not qualify for CRP2 based on diagnosis, will depend on insurance, referral sent to [facility] in case, as pt expressed interest in the program. Pt in recliner, call bell within reach.  [id number] Joylene Grapes, RN, BSN 06/02/2015 10:43 AM

## 2015-06-02 NOTE — Progress Notes (Signed)
06/02/2015 2:41 PM Discharge AVS meds taken today and those due this evening reviewed.  Follow-up appointments and when to call md reviewed.  D/C IV and TELE.  Questions and concerns addressed.   D/C home per orders. [name]

## 2015-06-04 ENCOUNTER — Other Ambulatory Visit: Payer: Self-pay | Admitting: *Deleted

## 2015-06-04 DIAGNOSIS — Z8679 Personal history of other diseases of the circulatory system: Secondary | ICD-10-CM

## 2015-06-04 DIAGNOSIS — Z9889 Other specified postprocedural states: Principal | ICD-10-CM

## 2015-06-15 ENCOUNTER — Encounter: Payer: Self-pay | Admitting: Thoracic Surgery (Cardiothoracic Vascular Surgery)

## 2015-06-15 ENCOUNTER — Ambulatory Visit
Admission: RE | Admit: 2015-06-15 | Discharge: 2015-06-15 | Disposition: A | Discharge status: Home / Self Care | Payer: Managed Care, Other (non HMO) | Source: Ambulatory Visit | Attending: Thoracic Surgery (Cardiothoracic Vascular Surgery) | Admitting: Thoracic Surgery (Cardiothoracic Vascular Surgery)

## 2015-06-15 ENCOUNTER — Ambulatory Visit (INDEPENDENT_AMBULATORY_CARE_PROVIDER_SITE_OTHER): Payer: Self-pay | Admitting: Thoracic Surgery (Cardiothoracic Vascular Surgery)

## 2015-06-15 VITALS — BP 106/77 | HR 115 | Resp 16 | Ht 67.0 in | Wt 205.0 lb

## 2015-06-15 DIAGNOSIS — Z9889 Other specified postprocedural states: Principal | ICD-10-CM

## 2015-06-15 DIAGNOSIS — I482 Chronic atrial fibrillation, unspecified: Secondary | ICD-10-CM

## 2015-06-15 DIAGNOSIS — Z8679 Personal history of other diseases of the circulatory system: Secondary | ICD-10-CM

## 2015-06-15 MED ORDER — DILTIAZEM HCL ER COATED BEADS 120 MG PO CP24: 120.0000 mg | ORAL_CAPSULE | Freq: Two times a day (BID) | ORAL | Status: DC

## 2015-06-15 NOTE — Progress Notes (Signed)
[address]       [name] [address]             [phone]     CARDIOTHORACIC SURGERY OFFICE NOTE  Referring Provider is Hillis Range, MD PCP is Sonda Primes, MD   HPI:  Patient returns to the office today for routine follow-up and rhythm check status post minimally invasive maze procedure on 05/28/2015.  His early postoperative recovery the hospital was uncomplicated and he was discharged home on the fifth postoperative day. He returns to our office for routine follow-up. He states that since he has been home he feels much better than he has in quite a long time. He has been walking every day and reports no problems with exertional shortness of breath. He has mild soreness in his chest from his surgery. He has mowed his lawn twice and he is eager to continue to increase his physical activity. He is sleeping well. He has not had any palpitations or dizzy spells. He states that his energy level is already much better than it was prior to surgery.   Current Outpatient Prescriptions  Medication Sig Dispense Refill  . amiodarone (PACERONE) 200 MG tablet Take 1 tablet (200 mg total) by mouth daily. 30 tablet 1  . carvedilol (COREG) 12.5 MG tablet Take 1 tablet (12.5 mg total) by mouth 2 (two) times daily with a meal. (Patient taking differently: Take 25 mg by mouth 2 (two) times daily with a meal. ) 60 tablet 1  . dabigatran (PRADAXA) 150 MG CAPS capsule Take 150 mg by mouth 2 (two) times daily. Reported on 05/25/2015    . diltiazem (CARDIZEM CD) 120 MG 24 hr capsule Take 1 capsule (120 mg total) by mouth daily. 30 capsule 1  . rosuvastatin (CRESTOR) 5 MG tablet Take 1 tablet (5 mg total) by mouth daily. 90 tablet 3  . oxyCODONE (OXY IR/ROXICODONE) 5 MG immediate release tablet Take 1-2 tablets (5-10 mg total) by mouth every 4 (four) hours as needed for severe pain. (Patient not taking: Reported on 06/15/2015) 30 tablet 0   No current facility-administered  medications for this visit.      Physical Exam:   BP 106/77 mmHg  Pulse 115  Resp 16  Ht 5\' 7"  (1.702 m)  Wt 205 lb (92.987 kg)  BMI 32.10 kg/m2  SpO2 97%  General:  Well-appearing  Chest:   Clear to auscultation  CV:   Tachycardic with regular rhythm  Incisions:  Clean and dry and healing nicely  Abdomen:  Soft and nontender  Extremities:  Warm and well-perfused  Diagnostic Tests:  2 channel telemetry rhythm strip demonstrates what is either sinus tachycardia or atypical atrial flutter with heart rate 110   CHEST 2 VIEW  COMPARISON: Chest x-ray of June 01, 2015  FINDINGS: There is tenting of the right hemidiaphragm which is new. There is a small right pleural effusion. There is no significant left pleural effusion. There is no pneumothorax. The cardiac silhouette is mildly enlarged. The pulmonary vascularity is not engorged. The mediastinum is normal in width. The bony thorax exhibits no acute abnormality.  IMPRESSION: Right basilar atelectasis and small right pleural effusion. This has developed since the previous study. Mild cardiomegaly without evidence of pulmonary edema.   Electronically Signed  By: David Swaziland M.D.  On: 06/15/2015 11:50   Impression:  Patient is doing very well less than 3 weeks following minimally invasive maze procedure. I suspect that he may be  in an atypical atrial flutter. His heart rate is slightly elevated but under adequate control.  He looks and feels quite well. He remains chronically anticoagulated using Pradaxa   Plan:  I have instructed the patient to increase his dose of Cardizem CD to 120 mg by mouth twice daily. He will remain on amiodarone and carvedilol at their present dosages. I have encouraged the patient to continue to gradually increase his physical activity as tolerated with his primary limitation remain that he refrain from heavy lifting or strenuous use of his arms or shoulders for least another 6  weeks. We will make certain that the patient has a follow-up appointment scheduled in the atrial fibrillation clinic. We will plan to have him return to our office in 4 weeks for follow-up and rhythm check.   Salvatore Decent. Cornelius Moras, MD 06/15/2015 12:14 PM

## 2015-06-15 NOTE — Patient Instructions (Signed)
You may continue to gradually increase your physical activity as tolerated.  Refrain from any heavy lifting or strenuous use of your arms and shoulders until at least 8 weeks from the time of your surgery, and avoid activities that cause increased pain in your chest on the side of your surgical incision.  Otherwise you may continue to increase activities without any particular limitations.  Increase the intensity and duration of physical activity gradually.  Increase your dose of Cardizem to 120 mg by mouth twice daily

## 2015-07-06 ENCOUNTER — Ambulatory Visit (HOSPITAL_COMMUNITY)
Admission: RE | Admit: 2015-07-06 | Discharge: 2015-07-06 | Disposition: A | Discharge status: Home / Self Care | Payer: Managed Care, Other (non HMO) | Source: Ambulatory Visit | Attending: Nurse Practitioner | Admitting: Nurse Practitioner

## 2015-07-06 DIAGNOSIS — I480 Paroxysmal atrial fibrillation: Secondary | ICD-10-CM | POA: Diagnosis not present

## 2015-07-06 DIAGNOSIS — R9431 Abnormal electrocardiogram [ECG] [EKG]: Secondary | ICD-10-CM | POA: Insufficient documentation

## 2015-07-06 DIAGNOSIS — I4510 Unspecified right bundle-branch block: Secondary | ICD-10-CM | POA: Insufficient documentation

## 2015-07-06 DIAGNOSIS — I4439 Other atrioventricular block: Secondary | ICD-10-CM | POA: Insufficient documentation

## 2015-07-06 DIAGNOSIS — I4892 Unspecified atrial flutter: Secondary | ICD-10-CM | POA: Insufficient documentation

## 2015-07-06 DIAGNOSIS — I4891 Unspecified atrial fibrillation: Secondary | ICD-10-CM | POA: Insufficient documentation

## 2015-07-06 MED ORDER — DILTIAZEM HCL ER COATED BEADS 120 MG PO CP24: ORAL_CAPSULE | ORAL | Status: DC

## 2015-07-06 NOTE — Progress Notes (Addendum)
Pt called wanting 12 lead ekg prior to appt with Dr. Cornelius Moras on 5/15.  Rudi Coco NP to review EKG.  EKG shows aflutter  at 117 bpm, qrs int 108 ms, qtc 454 ms. Discussed possible cardioversion but pt wants to discuss with Dr. Cornelius Moras first  with appointment pending 5/15. For now for better rate control increase cardizem to 240 mg am and 120 mg pm. Continues on pradaxa 150 mg bid.

## 2015-07-06 NOTE — Patient Instructions (Signed)
Your physician has recommended you make the following change in your medication:  1)Increase cardizem to 240mg  in the morning and 120mg  in the evening

## 2015-07-07 ENCOUNTER — Other Ambulatory Visit (HOSPITAL_COMMUNITY): Payer: Self-pay | Admitting: *Deleted

## 2015-07-07 MED ORDER — DILTIAZEM HCL ER COATED BEADS 120 MG PO CP24: ORAL_CAPSULE | ORAL | Status: DC

## 2015-07-13 ENCOUNTER — Encounter: Payer: Self-pay | Admitting: Thoracic Surgery (Cardiothoracic Vascular Surgery)

## 2015-07-13 ENCOUNTER — Ambulatory Visit (HOSPITAL_COMMUNITY)
Admission: RE | Admit: 2015-07-13 | Discharge: 2015-07-13 | Disposition: A | Discharge status: Home / Self Care | Payer: Managed Care, Other (non HMO) | Source: Ambulatory Visit | Attending: Nurse Practitioner | Admitting: Nurse Practitioner

## 2015-07-13 ENCOUNTER — Ambulatory Visit (INDEPENDENT_AMBULATORY_CARE_PROVIDER_SITE_OTHER): Payer: Self-pay | Admitting: Thoracic Surgery (Cardiothoracic Vascular Surgery)

## 2015-07-13 ENCOUNTER — Encounter (HOSPITAL_COMMUNITY): Payer: Self-pay | Admitting: Nurse Practitioner

## 2015-07-13 VITALS — BP 105/71 | HR 115 | Resp 16 | Ht 67.0 in | Wt 208.0 lb

## 2015-07-13 VITALS — BP 105/74 | HR 115

## 2015-07-13 DIAGNOSIS — Z7901 Long term (current) use of anticoagulants: Secondary | ICD-10-CM | POA: Insufficient documentation

## 2015-07-13 DIAGNOSIS — I4892 Unspecified atrial flutter: Secondary | ICD-10-CM | POA: Diagnosis not present

## 2015-07-13 DIAGNOSIS — Z881 Allergy status to other antibiotic agents status: Secondary | ICD-10-CM | POA: Insufficient documentation

## 2015-07-13 DIAGNOSIS — Z9889 Other specified postprocedural states: Secondary | ICD-10-CM | POA: Diagnosis not present

## 2015-07-13 DIAGNOSIS — I2510 Atherosclerotic heart disease of native coronary artery without angina pectoris: Secondary | ICD-10-CM | POA: Insufficient documentation

## 2015-07-13 DIAGNOSIS — I484 Atypical atrial flutter: Secondary | ICD-10-CM | POA: Diagnosis present

## 2015-07-13 DIAGNOSIS — I5032 Chronic diastolic (congestive) heart failure: Secondary | ICD-10-CM | POA: Diagnosis not present

## 2015-07-13 DIAGNOSIS — I482 Chronic atrial fibrillation, unspecified: Secondary | ICD-10-CM

## 2015-07-13 DIAGNOSIS — I110 Hypertensive heart disease with heart failure: Secondary | ICD-10-CM | POA: Insufficient documentation

## 2015-07-13 DIAGNOSIS — Z880 Allergy status to penicillin: Secondary | ICD-10-CM | POA: Insufficient documentation

## 2015-07-13 DIAGNOSIS — Z8679 Personal history of other diseases of the circulatory system: Secondary | ICD-10-CM

## 2015-07-13 DIAGNOSIS — Z79899 Other long term (current) drug therapy: Secondary | ICD-10-CM | POA: Insufficient documentation

## 2015-07-13 LAB — BASIC METABOLIC PANEL
ANION GAP: 9 (ref 5–15)
BUN: 14 mg/dL (ref 6–20)
CALCIUM: 9.4 mg/dL (ref 8.9–10.3)
CO2: 28 mmol/L (ref 22–32)
Chloride: 103 mmol/L (ref 101–111)
Creatinine, Ser: 1.31 mg/dL — ABNORMAL HIGH (ref 0.61–1.24)
GFR, EST NON AFRICAN AMERICAN: 58 mL/min — AB (ref >60)
GLUCOSE: 110 mg/dL — AB (ref 65–99)
Potassium: 4.1 mmol/L (ref 3.5–5.1)
SODIUM: 140 mmol/L (ref 135–145)

## 2015-07-13 LAB — CBC
HCT: 45.9 % (ref 39.0–52.0)
Hemoglobin: 14.8 g/dL (ref 13.0–17.0)
MCH: 30 pg (ref 26.0–34.0)
MCHC: 32.2 g/dL (ref 30.0–36.0)
MCV: 92.9 fL (ref 78.0–100.0)
PLATELETS: 227 10*3/uL (ref 150–400)
RBC: 4.94 MIL/uL (ref 4.22–5.81)
RDW: 14.3 % (ref 11.5–15.5)
WBC: 6.9 10*3/uL (ref 4.0–10.5)

## 2015-07-13 NOTE — Patient Instructions (Addendum)
Continue all previous medications without any changes at this time  Call Rudi Coco in the Atrial Fibrillation clinic to set up DC cardioversion

## 2015-07-13 NOTE — Patient Instructions (Signed)
Cardioversion scheduled for Friday, May 19th  - Arrive at the [facility] and go to admitting at [facility] not eat or drink anything after midnight the night prior to your procedure.  - Take all your medication with a sip of water prior to arrival.  - You will not be able to drive home after your procedure.

## 2015-07-13 NOTE — Progress Notes (Signed)
      [address]       [name] [address]             [phone]     CARDIOTHORACIC SURGERY OFFICE NOTE  Referring Provider is Hillis Range, MD PCP is Sonda Primes, MD   HPI:  Patient returns today office today for routine follow-up and rhythm check status post minimally invasive maze procedure on 05/28/2015. His early postoperative recovery in the hospital was uncomplicated. He was last seen here in our office on 06/15/2015 at which time he was doing well but appeared to be in atrial flutter with heart rate 115 bpm.  Last week he underwent a 12-lead EKG in the atrial fibrillation clinic which confirmed the presence of atypical atrial flutter. He returns to our office today for follow-up. The patient states that he is doing well, although he has noted that he now is getting tired with activity. His pulse has remain unchanged despite increased dose of carvedilol and Cardizem CD. He otherwise feels well. He has very mild residual soreness in his chest. He has not been taking any sort of pain relievers.   Current Outpatient Prescriptions  Medication Sig Dispense Refill  . amiodarone (PACERONE) 200 MG tablet Take 1 tablet (200 mg total) by mouth daily. 30 tablet 1  . carvedilol (COREG) 12.5 MG tablet Take 1 tablet (12.5 mg total) by mouth 2 (two) times daily with a meal. (Patient taking differently: Take 25 mg by mouth 2 (two) times daily with a meal. ) 60 tablet 1  . dabigatran (PRADAXA) 150 MG CAPS capsule Take 150 mg by mouth 2 (two) times daily. Reported on 05/25/2015    . diltiazem (CARDIZEM CD) 120 MG 24 hr capsule Take 240mg  by mouth in the morning and 120mg  in the evening 270 capsule 3  . rosuvastatin (CRESTOR) 5 MG tablet Take 1 tablet (5 mg total) by mouth daily. 90 tablet 3   No current facility-administered medications for this visit.      Physical Exam:   BP 105/71 mmHg  Pulse 115  Resp 16  Ht 5\' 7"  (1.702 m)  Wt 208 lb (94.348 kg)  BMI 32.57 kg/m2   SpO2 96%  General:  Well-appearing  Chest:   Clear to auscultation  CV:   Regular rate and rhythm without murmur  Incisions:  Healing nicely  Abdomen:  Soft, nontender  Extremities:  Warm and well-perfused  Diagnostic Tests:  12-lead EKG performed 07/06/2015 demonstrates atypical atrial flutter with heart rate 117  2 channel telemetry rhythm strip performed in the office today demonstrates atrial flutter with heart rate 110   Impression:  Patient appears to remain in atypical atrial flutter now approximately 6 weeks status post minimally invasive maze procedure. He is otherwise doing well but his heart rate remains somewhat elevated.   Plan:  I favor an attempt at DC cardioversion. I've discussed this with the patient and he will contact Rudi Coco in the atrial fibrillation clinic to arrange. We have not recommended any changes to the patient's current medications at this time.  Once the patient's rhythm problem has been stabilized he may begin to increase his physical activity without any particular limitations. However, I have suggested that he should avoid any strenuous exertion until DC cardioversion has been performed. All of his questions have been addressed. Patient will return for follow-up and rhythm check in 6 weeks.   Salvatore Decent. Cornelius Moras, MD 07/13/2015 1:45 PM

## 2015-07-13 NOTE — Progress Notes (Signed)
Patient ID: PANAV [name], male   DOB: January 22, 1956, 60 y.o.   MRN: [id number]     Primary Care Physician: Sonda Primes, MD Referring Physician: Dr. Cornelius Moras EP: Dr. Burna Cash CHARBEL [name] is a 60 y.o. male with a h/o minimally invasive maze procedure on 05/28/2015. Early postoperative recovery in the hospital was uncomplicated. He was last seen by Dr. Cornelius Moras today at which time he was doing well but appeared to be in atrial flutter with heart rate 115 bpm. Last week he underwent a 12-lead EKG in the atrial fibrillation clinic which confirmed the presence of atypical atrial flutter.  The patient states that he is doing well, although he has noted that he now is getting tired with activity. His pulse has remain unchanged despite increased dose of carvedilol and Cardizem CD. He otherwise feels well. He has very mild residual soreness in his chest.He is also s/p 3 ablations by Dr. Johney Frame and multiple cardioversion's in the past. He has not missed any blood thinners x 3 weeks.  Today, he denies symptoms of palpitations, chest pain, shortness of breath, orthopnea, PND, lower extremity edema, dizziness, presyncope, syncope, or neurologic sequela. The patient is tolerating medications without difficulties and is otherwise without complaint today.   Past Medical History  Diagnosis Date  . Persistent atrial fibrillation (HCC)     a. s/p afib ablation 02/13/10 and redo ablation 10/26/12  . Atrial flutter [facility])     s/p CTI ablation 2006 by Ladona Ridgel  . Non-ischemic cardiomyopathy (HCC)     tachycardiac mediated  . ALLERGIC RHINITIS   . Rheumatic fever     childhood  . Chronic diastolic (congestive) heart failure (HCC)   . Essential hypertension   . Obesity   . CAD (coronary artery disease) 03/25/15    nonobstructive by cath  . Dysrhythmia     AFIB  . Sleep apnea     PATIENT STATES HE DOES NOT HAVE  . Shortness of breath dyspnea     W/ EXERTION   . Arthritis   . S/P Minimally invasive maze operation  for atrial fibrillation 05/28/2015    Complete bilateral atrial lesion set using cryothermy and bipolar radiofrequency ablation with oversewing of LA appendage via right mini thoracotomy approach   . Atypical atrial flutter [facility])    Past Surgical History  Procedure Laterality Date  . Atrial ablation surgery  2006    CTI ablation  . Joint replacement Left     toe joint  . Tonsillectomy    . Atrial fibrillation ablation  02/13/10  . Tee without cardioversion N/A 10/25/2012    Procedure: TRANSESOPHAGEAL ECHOCARDIOGRAM (TEE);  Surgeon: Lewayne Bunting, MD;  Location: [facility] END[facility];  Service: Cardiovascular;  Laterality: N/A;  . Atrial fibrillation ablation N/A 10/26/2012    Procedure: ATRIAL FIBRILLATION ABLATION;  Surgeon: Hillis Range, MD;  Location: [facility] CATH LAB;  Service: Cardiovascular;  Laterality: N/A;  . Tee without cardioversion N/A 03/06/2015    Procedure: TRANSESOPHAGEAL ECHOCARDIOGRAM (TEE);  Surgeon: Thurmon Fair, MD;  Location: [facility] ENDOSCOPY;  Service: Cardiovascular;  Laterality: N/A;  . Cardioversion N/A 03/06/2015    Procedure: CARDIOVERSION;  Surgeon: Thurmon Fair, MD;  Location: [facility] ENDOSCOPY;  Service: Cardiovascular;  Laterality: N/A;  . Cardiac catheterization N/A 03/25/2015    Procedure: Left Heart Cath and Coronary Angiography;  Surgeon: Lennette Bihari, MD;  Location: [facility] INVASIVE CV LAB;  Service: Cardiovascular;  Laterality: N/A;  . Cardioversion N/A 04/15/2015    Procedure: CARDIOVERSION;  Surgeon: Loistine Chance  Earnstine Regal, MD;  Location: [facility] ENDOSCOPY;  Service: Cardiovascular;  Laterality: N/A;  . Minimally invasive maze procedure N/A 05/28/2015    Procedure: MINIMALLY INVASIVE MAZE PROCEDURE;  Surgeon: Purcell Nails, MD;  Location: [facility] OR;  Service: Open Heart Surgery;  Laterality: N/A;  . Tee without cardioversion N/A 05/28/2015    Procedure: TRANSESOPHAGEAL ECHOCARDIOGRAM (TEE);  Surgeon: Purcell Nails, MD;  Location: [facility] OR;  Service: Open Heart Surgery;  Laterality: N/A;     Current Outpatient Prescriptions  Medication Sig Dispense Refill  . amiodarone (PACERONE) 200 MG tablet Take 1 tablet (200 mg total) by mouth daily. 30 tablet 1  . carvedilol (COREG) 12.5 MG tablet Take 1 tablet (12.5 mg total) by mouth 2 (two) times daily with a meal. (Patient taking differently: Take 25 mg by mouth 2 (two) times daily with a meal. ) 60 tablet 1  . dabigatran (PRADAXA) 150 MG CAPS capsule Take 150 mg by mouth 2 (two) times daily. Reported on 05/25/2015    . diltiazem (CARDIZEM CD) 120 MG 24 hr capsule Take 240mg  by mouth in the morning and 120mg  in the evening 270 capsule 3  . rosuvastatin (CRESTOR) 5 MG tablet Take 1 tablet (5 mg total) by mouth daily. 90 tablet 3   No current facility-administered medications for this encounter.    Allergies  Allergen Reactions  . Penicillins Other (See Comments)    ## SYNCOPE ## (Tolerates amoxicillin) 05/28/15-Pt interview, reaction was "fainted after PCN as a child" Has patient had a PCN reaction causing immediate rash, facial/tongue/throat swelling, SOB or lightheadedness with hypotension: Yes Has patient had a PCN reaction causing severe rash involving mucus membranes or skin necrosis: No Has patient had a PCN reaction that required hospitalization No Has patient had a PCN reaction occurring within the last 10 years: No If all of the above answers are "NO  . Zinacef [Cefuroxime] Anaphylaxis    Patient reacted to 1 ml Test dose of Zinacef in OR.  . Cefuroxime Axetil Rash    Experienced redness and reaction in OR    Social History   Social History  . Marital Status: Married    Spouse Name: N/A  . Number of Children: 1  . 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 Smoking status: Never Smoker   . Smokeless tobacco: Never Used  . Alcohol Use: No  . Drug Use: No  . Sexual Activity: Not on file   Other Topics Concern  . Not on file   Social History Narrative   He  is married and lives with his wife in [place].     Family History  Problem Relation Age of Onset  . Lung cancer Father     smoker  . Lung cancer Mother     smoker  . Diabetes Neg Hx   . Prostate cancer Neg Hx   . Colon cancer Neg Hx     ROS- All systems are reviewed and negative except as per the HPI above  Physical Exam: Filed Vitals:   07/13/15 1442  BP: 105/74  Pulse: 115    GEN- The patient is well appearing, alert and oriented x 3 today.   Head- normocephalic, atraumatic Eyes-  Sclera clear, conjunctiva pink Ears- hearing intact Oropharynx- clear Neck- supple, no JVP Lymph- no cervical lymphadenopathy Lungs- Clear to ausculation bilaterally, normal work of breathing Heart- Regular rate and rhythm, no murmurs, rubs or gallops,  PMI not laterally displaced GI- soft, NT, ND, + BS Extremities- no clubbing, cyanosis, or edema MS- no significant deformity or atrophy Skin- no rash or lesion Psych- euthymic mood, full affect Neuro- strength and sensation are intact  EKG- Atypical atrial flutter, 115 bpm, qrs int 100 ms, QTc 464 ms Dr. Orvan July note reviewed  Assessment and Plan: 1.  6 weeks s/p minimally invasive maze procedure  Persistent atypical atrial flutter since surgery  Scheduled for cardioversion Friday 5/19 No interruption in Doac x last 3 weeks Bmet/cbc drawn today   F/u in one week after cardioversion in afib clinic 6 weeks with Dr. Lowella Curb [facility] [address] [place], [place] [address] [phone]

## 2015-07-17 ENCOUNTER — Ambulatory Visit (HOSPITAL_COMMUNITY): Payer: Managed Care, Other (non HMO) | Admitting: Anesthesiology

## 2015-07-17 ENCOUNTER — Ambulatory Visit (HOSPITAL_COMMUNITY)
Admission: RE | Admit: 2015-07-17 | Discharge: 2015-07-17 | Disposition: A | Discharge status: Home / Self Care | Payer: Managed Care, Other (non HMO) | Source: Ambulatory Visit | Attending: Cardiology | Admitting: Cardiology

## 2015-07-17 ENCOUNTER — Encounter (HOSPITAL_COMMUNITY): Payer: Self-pay

## 2015-07-17 ENCOUNTER — Encounter (HOSPITAL_COMMUNITY)
Admission: RE | Disposition: A | Discharge status: Home / Self Care | Payer: Self-pay | Source: Ambulatory Visit | Attending: Cardiology

## 2015-07-17 DIAGNOSIS — I429 Cardiomyopathy, unspecified: Secondary | ICD-10-CM | POA: Diagnosis not present

## 2015-07-17 DIAGNOSIS — I484 Atypical atrial flutter: Secondary | ICD-10-CM | POA: Insufficient documentation

## 2015-07-17 DIAGNOSIS — I481 Persistent atrial fibrillation: Secondary | ICD-10-CM | POA: Diagnosis not present

## 2015-07-17 DIAGNOSIS — I4892 Unspecified atrial flutter: Secondary | ICD-10-CM | POA: Diagnosis present

## 2015-07-17 DIAGNOSIS — Z96698 Presence of other orthopedic joint implants: Secondary | ICD-10-CM | POA: Diagnosis not present

## 2015-07-17 DIAGNOSIS — Z79899 Other long term (current) drug therapy: Secondary | ICD-10-CM | POA: Insufficient documentation

## 2015-07-17 DIAGNOSIS — M1990 Unspecified osteoarthritis, unspecified site: Secondary | ICD-10-CM | POA: Insufficient documentation

## 2015-07-17 DIAGNOSIS — I5032 Chronic diastolic (congestive) heart failure: Secondary | ICD-10-CM | POA: Diagnosis not present

## 2015-07-17 DIAGNOSIS — I10 Essential (primary) hypertension: Secondary | ICD-10-CM | POA: Diagnosis not present

## 2015-07-17 DIAGNOSIS — I2510 Atherosclerotic heart disease of native coronary artery without angina pectoris: Secondary | ICD-10-CM | POA: Diagnosis not present

## 2015-07-17 DIAGNOSIS — Z7901 Long term (current) use of anticoagulants: Secondary | ICD-10-CM | POA: Insufficient documentation

## 2015-07-17 HISTORY — PX: CARDIOVERSION: SHX1299

## 2015-07-17 SURGERY — CARDIOVERSION
Anesthesia: Monitor Anesthesia Care

## 2015-07-17 MED ORDER — LIDOCAINE HCL (CARDIAC) 20 MG/ML IV SOLN
INTRAVENOUS | Status: DC | PRN
  Administered 2015-07-17: 40 mg via INTRATRACHEAL

## 2015-07-17 MED ORDER — LACTATED RINGERS IV SOLN
INTRAVENOUS | Status: DC | PRN
  Administered 2015-07-17: 12:00:00 via INTRAVENOUS

## 2015-07-17 MED ORDER — PROPOFOL 10 MG/ML IV BOLUS
INTRAVENOUS | Status: DC | PRN
  Administered 2015-07-17: 80 mg via INTRAVENOUS

## 2015-07-17 NOTE — Anesthesia Postprocedure Evaluation (Signed)
Anesthesia Post Note  Patient: [name]  Procedure(s) Performed: Procedure(s) (LRB): CARDIOVERSION (N/A)  Patient location during evaluation: Other Anesthesia Type: MAC Level of consciousness: awake and alert Pain management: pain level controlled Vital Signs Assessment: post-procedure vital signs reviewed and stable Respiratory status: spontaneous breathing, nonlabored ventilation, respiratory function stable and patient connected to nasal cannula oxygen Cardiovascular status: stable and blood pressure returned to baseline Anesthetic complications: no    Last Vitals:  Filed Vitals:   07/17/15 1235 07/17/15 1240  BP: 98/61 100/65  Pulse: 66 67  Resp: 15 21    Last Pain: There were no vitals filed for this visit.               Phillips Grout

## 2015-07-17 NOTE — Discharge Instructions (Signed)

## 2015-07-17 NOTE — CV Procedure (Signed)
    Electrical Cardioversion Procedure Note [name] [id number] Apr 25, 1955  Procedure: Electrical Cardioversion Indications:  Atrial Flutter  Time Out: Verified patient identification, verified procedure,medications/allergies/relevent history reviewed, required imaging and test results available.  Performed  Procedure Details  The patient was NPO after midnight. Anesthesia was administered at the beside  by anesthesia with propofol.  Cardioversion was performed with synchronized biphasic defibrillation via AP pads with 150 joules.  1 attempt(s) were performed.  The patient converted to normal sinus rhythm. The patient tolerated the procedure well   IMPRESSION:  Successful cardioversion of atrial flutter. Post MAZE  [name] 07/17/2015, 12:23 PM

## 2015-07-17 NOTE — Transfer of Care (Signed)
Immediate Anesthesia Transfer of Care Note  Patient: [name]  Procedure(s) Performed: Procedure(s): CARDIOVERSION (N/A)  Patient Location: Endoscopy Unit  Anesthesia Type:MAC  Level of Consciousness: awake, alert  and oriented  Airway & Oxygen Therapy: Patient Spontanous Breathing and Patient connected to nasal cannula oxygen  Post-op Assessment: Report given to RN, Post -op Vital signs reviewed and stable and Patient moving all extremities X 4  Post vital signs: Reviewed and stable  Last Vitals:  Filed Vitals:   07/17/15 1059  BP: 121/97  Pulse: 113  Resp: 17    Last Pain: There were no vitals filed for this visit.       Complications: No apparent anesthesia complications

## 2015-07-17 NOTE — H&P (View-Only) (Signed)
Patient ID: [name], male   DOB: January 22, 1956, 60 y.o.   MRN: [id number]     Primary Care Physician: Sonda Primes, MD Referring Physician: Dr. Cornelius Moras EP: Dr. Burna Cash [name] is a 60 y.o. male with a h/o minimally invasive maze procedure on 05/28/2015. Early postoperative recovery in the hospital was uncomplicated. He was last seen by Dr. Cornelius Moras today at which time he was doing well but appeared to be in atrial flutter with heart rate 115 bpm. Last week he underwent a 12-lead EKG in the atrial fibrillation clinic which confirmed the presence of atypical atrial flutter.  The patient states that he is doing well, although he has noted that he now is getting tired with activity. His pulse has remain unchanged despite increased dose of carvedilol and Cardizem CD. He otherwise feels well. He has very mild residual soreness in his chest.He is also s/p 3 ablations by Dr. Johney Frame and multiple cardioversion's in the past. He has not missed any blood thinners x 3 weeks.  Today, he denies symptoms of palpitations, chest pain, shortness of breath, orthopnea, PND, lower extremity edema, dizziness, presyncope, syncope, or neurologic sequela. The patient is tolerating medications without difficulties and is otherwise without complaint today.   Past Medical History  Diagnosis Date  . Persistent atrial fibrillation (HCC)     a. s/p afib ablation 02/13/10 and redo ablation 10/26/12  . Atrial flutter [facility])     s/p CTI ablation 2006 by Ladona Ridgel  . Non-ischemic cardiomyopathy (HCC)     tachycardiac mediated  . ALLERGIC RHINITIS   . Rheumatic fever     childhood  . Chronic diastolic (congestive) heart failure (HCC)   . Essential hypertension   . Obesity   . CAD (coronary artery disease) 03/25/15    nonobstructive by cath  . Dysrhythmia     AFIB  . Sleep apnea     PATIENT STATES HE DOES NOT HAVE  . Shortness of breath dyspnea     W/ EXERTION   . Arthritis   . S/P Minimally invasive maze operation  for atrial fibrillation 05/28/2015    Complete bilateral atrial lesion set using cryothermy and bipolar radiofrequency ablation with oversewing of LA appendage via right mini thoracotomy approach   . Atypical atrial flutter [facility])    Past Surgical History  Procedure Laterality Date  . Atrial ablation surgery  2006    CTI ablation  . Joint replacement Left     toe joint  . Tonsillectomy    . Atrial fibrillation ablation  02/13/10  . Tee without cardioversion N/A 10/25/2012    Procedure: TRANSESOPHAGEAL ECHOCARDIOGRAM (TEE);  Surgeon: Lewayne Bunting, MD;  Location: [facility] ENDOS[facility];  Service: Cardiovascular;  Laterality: N/A;  . Atrial fibrillation ablation N/A 10/26/2012    Procedure: ATRIAL FIBRILLATION ABLATION;  Surgeon: Hillis Range, MD;  Location: [facility] CATH LAB;  Service: Cardiovascular;  Laterality: N/A;  . Tee without cardioversion N/A 03/06/2015    Procedure: TRANSESOPHAGEAL ECHOCARDIOGRAM (TEE);  Surgeon: Thurmon Fair, MD;  Location: [facility] ENDOSCOPY;  Service: Cardiovascular;  Laterality: N/A;  . Cardioversion N/A 03/06/2015    Procedure: CARDIOVERSION;  Surgeon: Thurmon Fair, MD;  Location: [facility] ENDOSCOPY;  Service: Cardiovascular;  Laterality: N/A;  . Cardiac catheterization N/A 03/25/2015    Procedure: Left Heart Cath and Coronary Angiography;  Surgeon: Lennette Bihari, MD;  Location: [facility] INVASIVE CV LAB;  Service: Cardiovascular;  Laterality: N/A;  . Cardioversion N/A 04/15/2015    Procedure: CARDIOVERSION;  Surgeon: Loistine Chance  Earnstine Regal, MD;  Location: [facility] ENDOSCOPY;  Service: Cardiovascular;  Laterality: N/A;  . Minimally invasive maze procedure N/A 05/28/2015    Procedure: MINIMALLY INVASIVE MAZE PROCEDURE;  Surgeon: Purcell Nails, MD;  Location: [facility] OR;  Service: Open Heart Surgery;  Laterality: N/A;  . Tee without cardioversion N/A 05/28/2015    Procedure: TRANSESOPHAGEAL ECHOCARDIOGRAM (TEE);  Surgeon: Purcell Nails, MD;  Location: [facility] OR;  Service: Open Heart Surgery;  Laterality: N/A;     Current Outpatient Prescriptions  Medication Sig Dispense Refill  . amiodarone (PACERONE) 200 MG tablet Take 1 tablet (200 mg total) by mouth daily. 30 tablet 1  . carvedilol (COREG) 12.5 MG tablet Take 1 tablet (12.5 mg total) by mouth 2 (two) times daily with a meal. (Patient taking differently: Take 25 mg by mouth 2 (two) times daily with a meal. ) 60 tablet 1  . dabigatran (PRADAXA) 150 MG CAPS capsule Take 150 mg by mouth 2 (two) times daily. Reported on 05/25/2015    . diltiazem (CARDIZEM CD) 120 MG 24 hr capsule Take 240mg  by mouth in the morning and 120mg  in the evening 270 capsule 3  . rosuvastatin (CRESTOR) 5 MG tablet Take 1 tablet (5 mg total) by mouth daily. 90 tablet 3   No current facility-administered medications for this encounter.    Allergies  Allergen Reactions  . Penicillins Other (See Comments)    ## SYNCOPE ## (Tolerates amoxicillin) 05/28/15-Pt interview, reaction was "fainted after PCN as a child" Has patient had a PCN reaction causing immediate rash, facial/tongue/throat swelling, SOB or lightheadedness with hypotension: Yes Has patient had a PCN reaction causing severe rash involving mucus membranes or skin necrosis: No Has patient had a PCN reaction that required hospitalization No Has patient had a PCN reaction occurring within the last 10 years: No If all of the above answers are "NO  . Zinacef [Cefuroxime] Anaphylaxis    Patient reacted to 1 ml Test dose of Zinacef in OR.  . Cefuroxime Axetil Rash    Experienced redness and reaction in OR    Social History   Social History  . Marital Status: Married    Spouse Name: N/A  . Number of Children: 1  . 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 Smoking status: Never Smoker   . Smokeless tobacco: Never Used  . Alcohol Use: No  . Drug Use: No  . Sexual Activity: Not on file   Other Topics Concern  . Not on file   Social History Narrative   He  is married and lives with his wife in [place].     Family History  Problem Relation Age of Onset  . Lung cancer Father     smoker  . Lung cancer Mother     smoker  . Diabetes Neg Hx   . Prostate cancer Neg Hx   . Colon cancer Neg Hx     ROS- All systems are reviewed and negative except as per the HPI above  Physical Exam: Filed Vitals:   07/13/15 1442  BP: 105/74  Pulse: 115    GEN- The patient is well appearing, alert and oriented x 3 today.   Head- normocephalic, atraumatic Eyes-  Sclera clear, conjunctiva pink Ears- hearing intact Oropharynx- clear Neck- supple, no JVP Lymph- no cervical lymphadenopathy Lungs- Clear to ausculation bilaterally, normal work of breathing Heart- Regular rate and rhythm, no murmurs, rubs or gallops,  PMI not laterally displaced GI- soft, NT, ND, + BS Extremities- no clubbing, cyanosis, or edema MS- no significant deformity or atrophy Skin- no rash or lesion Psych- euthymic mood, full affect Neuro- strength and sensation are intact  EKG- Atypical atrial flutter, 115 bpm, qrs int 100 ms, QTc 464 ms Dr. Orvan July note reviewed  Assessment and Plan: 1.  6 weeks s/p minimally invasive maze procedure  Persistent atypical atrial flutter since surgery  Scheduled for cardioversion Friday 5/19 No interruption in Doac x last 3 weeks Bmet/cbc drawn today   F/u in one week after cardioversion in afib clinic 6 weeks with Dr. Lowella Curb [facility] [address] [place], [place] [address] [phone]

## 2015-07-17 NOTE — Anesthesia Preprocedure Evaluation (Addendum)
Anesthesia Evaluation  Patient identified by MRN, date of birth, ID band Patient awake    Reviewed: Allergy & Precautions, NPO status , Patient's Chart, lab work & pertinent test results  Airway Mallampati: II  TM Distance: >3 FB Neck ROM: Full    Dental no notable dental hx.    Pulmonary neg pulmonary ROS, neg sleep apnea,    Pulmonary exam normal breath sounds clear to auscultation       Cardiovascular hypertension, Pt. on medications + CAD  Normal cardiovascular exam+ dysrhythmias Atrial Fibrillation  Rhythm:Regular Rate:Normal     Neuro/Psych negative neurological ROS  negative psych ROS   GI/Hepatic negative GI ROS, Neg liver ROS,   Endo/Other  negative endocrine ROS  Renal/GU negative Renal ROS  negative genitourinary   Musculoskeletal negative musculoskeletal ROS (+)   Abdominal   Peds negative pediatric ROS (+)  Hematology negative hematology ROS (+)   Anesthesia Other Findings   Reproductive/Obstetrics negative OB ROS                            Anesthesia Physical Anesthesia Plan  ASA: III  Anesthesia Plan: MAC   Post-op Pain Management:    Induction:   Airway Management Planned: Nasal Cannula  Additional Equipment:   Intra-op Plan:   Post-operative Plan:   Informed Consent: I have reviewed the patients History and Physical, chart, labs and discussed the procedure including the risks, benefits and alternatives for the proposed anesthesia with the patient or authorized representative who has indicated his/her understanding and acceptance.   Dental advisory given  Plan Discussed with: CRNA  Anesthesia Plan Comments:         Anesthesia Quick Evaluation

## 2015-07-17 NOTE — Interval H&P Note (Signed)
History and Physical Interval Note:  07/17/2015 12:05 PM  [name]  has presented today for surgery, with the diagnosis of AFLUTTER  The various methods of treatment have been discussed with the patient and family. After consideration of risks, benefits and other options for treatment, the patient has consented to  Procedure(s): CARDIOVERSION (N/A) as a surgical intervention .  The patient's history has been reviewed, patient examined, no change in status, stable for surgery.  I have reviewed the patient's chart and labs.  Questions were answered to the patient's satisfaction.     [employer]

## 2015-07-20 ENCOUNTER — Encounter: Payer: Managed Care, Other (non HMO) | Admitting: Thoracic Surgery (Cardiothoracic Vascular Surgery)

## 2015-07-20 ENCOUNTER — Encounter (HOSPITAL_COMMUNITY): Payer: Self-pay | Admitting: Cardiology

## 2015-07-23 ENCOUNTER — Encounter (HOSPITAL_COMMUNITY): Payer: Self-pay | Admitting: Nurse Practitioner

## 2015-07-23 ENCOUNTER — Telehealth (HOSPITAL_COMMUNITY): Payer: Self-pay | Admitting: *Deleted

## 2015-07-23 ENCOUNTER — Ambulatory Visit (HOSPITAL_COMMUNITY)
Admission: RE | Admit: 2015-07-23 | Discharge: 2015-07-23 | Disposition: A | Discharge status: Home / Self Care | Payer: Managed Care, Other (non HMO) | Source: Ambulatory Visit | Attending: Nurse Practitioner | Admitting: Nurse Practitioner

## 2015-07-23 VITALS — BP 118/70 | HR 53 | Ht 67.0 in | Wt 213.0 lb

## 2015-07-23 DIAGNOSIS — Z6833 Body mass index (BMI) 33.0-33.9, adult: Secondary | ICD-10-CM | POA: Insufficient documentation

## 2015-07-23 DIAGNOSIS — M1990 Unspecified osteoarthritis, unspecified site: Secondary | ICD-10-CM | POA: Insufficient documentation

## 2015-07-23 DIAGNOSIS — E669 Obesity, unspecified: Secondary | ICD-10-CM | POA: Diagnosis not present

## 2015-07-23 DIAGNOSIS — Z9889 Other specified postprocedural states: Secondary | ICD-10-CM | POA: Diagnosis not present

## 2015-07-23 DIAGNOSIS — Z880 Allergy status to penicillin: Secondary | ICD-10-CM | POA: Diagnosis not present

## 2015-07-23 DIAGNOSIS — R0602 Shortness of breath: Secondary | ICD-10-CM | POA: Diagnosis not present

## 2015-07-23 DIAGNOSIS — I5032 Chronic diastolic (congestive) heart failure: Secondary | ICD-10-CM | POA: Insufficient documentation

## 2015-07-23 DIAGNOSIS — I429 Cardiomyopathy, unspecified: Secondary | ICD-10-CM | POA: Diagnosis not present

## 2015-07-23 DIAGNOSIS — I481 Persistent atrial fibrillation: Secondary | ICD-10-CM | POA: Diagnosis not present

## 2015-07-23 DIAGNOSIS — R001 Bradycardia, unspecified: Secondary | ICD-10-CM | POA: Insufficient documentation

## 2015-07-23 DIAGNOSIS — Z7901 Long term (current) use of anticoagulants: Secondary | ICD-10-CM | POA: Diagnosis not present

## 2015-07-23 DIAGNOSIS — I484 Atypical atrial flutter: Secondary | ICD-10-CM | POA: Diagnosis not present

## 2015-07-23 DIAGNOSIS — Z0189 Encounter for other specified special examinations: Secondary | ICD-10-CM | POA: Insufficient documentation

## 2015-07-23 DIAGNOSIS — I4892 Unspecified atrial flutter: Secondary | ICD-10-CM

## 2015-07-23 DIAGNOSIS — I440 Atrioventricular block, first degree: Secondary | ICD-10-CM | POA: Diagnosis not present

## 2015-07-23 DIAGNOSIS — I110 Hypertensive heart disease with heart failure: Secondary | ICD-10-CM | POA: Diagnosis not present

## 2015-07-23 DIAGNOSIS — I2510 Atherosclerotic heart disease of native coronary artery without angina pectoris: Secondary | ICD-10-CM | POA: Insufficient documentation

## 2015-07-23 MED ORDER — DILTIAZEM HCL ER COATED BEADS 120 MG PO CP24: 120.0000 mg | ORAL_CAPSULE | Freq: Every day | ORAL | Status: DC

## 2015-07-23 MED ORDER — CARVEDILOL 25 MG PO TABS: 25.0000 mg | ORAL_TABLET | Freq: Two times a day (BID) | ORAL | Status: DC

## 2015-07-23 MED ORDER — DABIGATRAN ETEXILATE MESYLATE 150 MG PO CAPS: 150.0000 mg | ORAL_CAPSULE | Freq: Two times a day (BID) | ORAL | Status: DC

## 2015-07-23 MED ORDER — APIXABAN 5 MG PO TABS: 5.0000 mg | ORAL_TABLET | Freq: Two times a day (BID) | ORAL | Status: DC

## 2015-07-23 NOTE — Telephone Encounter (Signed)
Insurance denied pradaxa stating not preferred formulary. Patient notified had no preference on replacement other than did not want coumadin.  Since recent cardioversion last week Rudi Coco NP to prefer no break in anticoagulation there for Eliquis 5mg  twice a day was called in the pharmacy of choice.

## 2015-07-23 NOTE — Progress Notes (Signed)
Patient ID: [name], male   DOB: [birth year], 60 y.o.   MRN: [id number]     Primary Care Physician: Sonda Primes, MD Referring Physician: Dr. Cornelius Moras EP: Dr. Burna Cash [name] is a 60 y.o. male with a h/o minimally invasive maze procedure on 05/28/2015. Early postoperative recovery in the hospital was uncomplicated. He was last seen by Dr. Cornelius Moras today at which time he was doing well but appeared to be in atrial flutter with heart rate 115 bpm. Last week he underwent a 12-lead EKG in the atrial fibrillation clinic which confirmed the presence of atypical atrial flutter.  The patient states that he is doing well, although he has noted that he now is getting tired with activity. His pulse has remain unchanged despite increased dose of carvedilol and Cardizem CD. He otherwise feels well. He has very mild residual soreness in his chest.He is s/p 3 ablations by Dr. Johney Frame and multiple cardioversion's in the past. He has not missed any blood thinners x 3 weeks.  He returns to the af clinic 5/25 one week after successful cardioversion. He has been maintaining SR but now is on the slow side and has noticed some soft blood pressures with mild dizziness. He has Sinus brady at 53 bpm today. weight is up 5 lbs, possibly due to increased dose of cardizem. He is feeling better with more energy and less shortness of breath with activities.  Today, he denies symptoms of palpitations, chest pain, shortness of breath, orthopnea, PND, lower extremity edema, dizziness, presyncope, syncope, or neurologic sequela. The patient is tolerating medications without difficulties and is otherwise without complaint today.   Past Medical History  Diagnosis Date  . Persistent atrial fibrillation (HCC)     a. s/p afib ablation 02/13/10 and redo ablation 10/26/12  . Atrial flutter [facility])     s/p CTI ablation 2006 by Ladona Ridgel  . Non-ischemic cardiomyopathy (HCC)     tachycardiac mediated  . ALLERGIC RHINITIS   .  Rheumatic fever     childhood  . Chronic diastolic (congestive) heart failure (HCC)   . Essential hypertension   . Obesity   . CAD (coronary artery disease) 03/25/15    nonobstructive by cath  . Dysrhythmia     AFIB  . Sleep apnea     PATIENT STATES HE DOES NOT HAVE  . Shortness of breath dyspnea     W/ EXERTION   . Arthritis   . S/P Minimally invasive maze operation for atrial fibrillation 05/28/2015    Complete bilateral atrial lesion set using cryothermy and bipolar radiofrequency ablation with oversewing of LA appendage via right mini thoracotomy approach   . Atypical atrial flutter [facility])    Past Surgical History  Procedure Laterality Date  . Atrial ablation surgery  2006    CTI ablation  . Joint replacement Left     toe joint  . Tonsillectomy    . Atrial fibrillation ablation  02/13/10  . Tee without cardioversion N/A 10/25/2012    Procedure: TRANSESOPHAGEAL ECHOCARDIOGRAM (TEE);  Surgeon: Lewayne Bunting, MD;  Location: [facility] ENDOSCOPY;  Service: Cardiovascular;  Laterality: N/A;  . Atrial fibrillation ablation N/A 10/26/2012    Procedure: ATRIAL FIBRILLATION ABLATION;  Surgeon: Hillis Range, MD;  Location: [facility] CATH LAB;  Service: Cardiovascular;  Laterality: N/A;  . Tee without cardioversion N/A 03/06/2015    Procedure: TRANSESOPHAGEAL ECHOCARDIOGRAM (TEE);  Surgeon: Thurmon Fair, MD;  Location: [facility] ENDOSCOPY;  Service: Cardiovascular;  Laterality: N/A;  . Cardioversion N/A  03/06/2015    Procedure: CARDIOVERSION;  Surgeon: Thurmon Fair, MD;  Location: [facility] ENDOSCOPY;  Service: Cardiovascular;  Laterality: N/A;  . Cardiac catheterization N/A 03/25/2015    Procedure: Left Heart Cath and Coronary Angiography;  Surgeon: Lennette Bihari, MD;  Location: [facility] INVASIVE CV LAB;  Service: Cardiovascular;  Laterality: N/A;  . Cardioversion N/A 04/15/2015    Procedure: CARDIOVERSION;  Surgeon: Vesta Mixer, MD;  Location: [facility] ENDOSCOPY;  Service: Cardiovascular;  Laterality: N/A;  . Minimally  invasive maze procedure N/A 05/28/2015    Procedure: MINIMALLY INVASIVE MAZE PROCEDURE;  Surgeon: Purcell Nails, MD;  Location: [facility] OR;  Service: Open Heart Surgery;  Laterality: N/A;  . Tee without cardioversion N/A 05/28/2015    Procedure: TRANSESOPHAGEAL ECHOCARDIOGRAM (TEE);  Surgeon: Purcell Nails, MD;  Location: [facility] OR;  Service: Open Heart Surgery;  Laterality: N/A;  . Cardioversion N/A 07/17/2015    Procedure: CARDIOVERSION;  Surgeon: Jake Bathe, MD;  Location: [facility] ENDOSCOPY;  Service: Cardiovascular;  Laterality: N/A;    Current Outpatient Prescriptions  Medication Sig Dispense Refill  . amiodarone (PACERONE) 200 MG tablet Take 1 tablet (200 mg total) by mouth daily. 30 tablet 1  . carvedilol (COREG) 25 MG tablet Take 1 tablet (25 mg total) by mouth 2 (two) times daily with a meal. 180 tablet 3  . dabigatran (PRADAXA) 150 MG CAPS capsule Take 1 capsule (150 mg total) by mouth 2 (two) times daily. Reported on 05/25/2015 180 capsule 3  . diltiazem (CARDIZEM CD) 120 MG 24 hr capsule Take 1 capsule (120 mg total) by mouth daily. 270 capsule 3  . rosuvastatin (CRESTOR) 5 MG tablet Take 1 tablet (5 mg total) by mouth daily. 90 tablet 3   No current facility-administered medications for this encounter.    Allergies  Allergen Reactions  . Penicillins Other (See Comments)    ## SYNCOPE ## (Tolerates amoxicillin) 05/28/15-Pt interview, reaction was "fainted after PCN as a child" Has patient had a PCN reaction causing immediate rash, facial/tongue/throat swelling, SOB or lightheadedness with hypotension: Yes Has patient had a PCN reaction causing severe rash involving mucus membranes or skin necrosis: No Has patient had a PCN reaction that required hospitalization No Has patient had a PCN reaction occurring within the last 10 years: No If all of the above answers are "NO  . Zinacef [Cefuroxime] Anaphylaxis    Patient reacted to 1 ml Test dose of Zinacef in OR.  . Cefuroxime Axetil Rash     Experienced redness and reaction in OR    Social History   Social History  . Marital Status: Married    Spouse Name: N/A  . Number of Children: 1  . 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 Smoking status: Never Smoker   . Smokeless tobacco: Never Used  . Alcohol Use: No  . Drug Use: No  . Sexual Activity: Not on file   Other Topics Concern  . Not on file   Social History Narrative   He is married and lives with his wife in [place].     Family History  Problem Relation Age of Onset  . Lung cancer Father     smoker  . Lung cancer Mother     smoker  . Diabetes Neg Hx   . Prostate cancer Neg Hx   . Colon cancer Neg Hx     ROS- All systems are reviewed and negative  except as per the HPI above  Physical Exam: Filed Vitals:   07/23/15 1336  BP: 118/70  Pulse: 53  Height: 5\' 7"  (1.702 m)  Weight: 213 lb (96.616 kg)    GEN- The patient is well appearing, alert and oriented x 3 today.   Head- normocephalic, atraumatic Eyes-  Sclera clear, conjunctiva pink Ears- hearing intact Oropharynx- clear Neck- supple, no JVP Lymph- no cervical lymphadenopathy Lungs- Clear to ausculation bilaterally, normal work of breathing Heart- Slow, regular rate and rhythm, no murmurs, rubs or gallops, PMI not laterally displaced GI- soft, NT, ND, + BS Extremities- no clubbing, cyanosis, or edema MS- no significant deformity or atrophy Skin- no rash or lesion Psych- euthymic mood, full affect Neuro- strength and sensation are intact  EKG- sinus brady with first degree AVB, 53 bpm, LAD, pr int 218 ms, qrs int 94 bpm, qtc 399 ms Epic records reviewed  Assessment and Plan: 1.   Persistent atypical atrial flutter since s /p minimally invasive maze procedure  7 weeks post op                                  Successful  Cardioversion  5/19 No interruption in [place] Continue carvedilol  2. Bradycardia  Decrease cardizem to 120  mg qd   F/u in  as scheduled Dr. Johney Frame 6/16 Dr. Cornelius Moras 7/10 Afib clinic as needed   Lupita Leash C. [facility] [address] [place], [place] [address] [phone]

## 2015-07-23 NOTE — Patient Instructions (Signed)
Your physician has recommended you make the following change in your medication:  1)Decrease cardizem 120mg once a day 

## 2015-07-24 ENCOUNTER — Other Ambulatory Visit (HOSPITAL_COMMUNITY): Payer: Self-pay | Admitting: *Deleted

## 2015-07-24 MED ORDER — APIXABAN 5 MG PO TABS: 5.0000 mg | ORAL_TABLET | Freq: Two times a day (BID) | ORAL | Status: DC

## 2015-08-11 ENCOUNTER — Other Ambulatory Visit: Payer: Self-pay

## 2015-08-14 ENCOUNTER — Encounter: Payer: Self-pay | Admitting: Internal Medicine

## 2015-08-14 ENCOUNTER — Ambulatory Visit (INDEPENDENT_AMBULATORY_CARE_PROVIDER_SITE_OTHER): Payer: Managed Care, Other (non HMO) | Admitting: Internal Medicine

## 2015-08-14 VITALS — BP 118/78 | HR 60 | Ht 67.0 in | Wt 214.8 lb

## 2015-08-14 DIAGNOSIS — I482 Chronic atrial fibrillation, unspecified: Secondary | ICD-10-CM

## 2015-08-14 DIAGNOSIS — I5022 Chronic systolic (congestive) heart failure: Secondary | ICD-10-CM | POA: Diagnosis not present

## 2015-08-14 DIAGNOSIS — I10 Essential (primary) hypertension: Secondary | ICD-10-CM | POA: Diagnosis not present

## 2015-08-14 NOTE — Patient Instructions (Signed)
Medication Instructions:  Your physician has recommended you make the following change in your medication:  1) Stop Diltiazem    Labwork: None ordered   Testing/Procedures: None ordered   Follow-Up: Your physician recommends that you schedule a follow-up appointment in: 3 months with Rudi Coco, NP   Any Other Special Instructions Will Be Listed Below (If Applicable).     If you need a refill on your cardiac medications before your next appointment, please call your pharmacy.

## 2015-08-14 NOTE — Progress Notes (Signed)
PCP:  Sonda Primes, MD Primary Cardiology:  Dr Antoine Poche Primary EP:  Corneshia Hines  The patient presents today for electrophysiology follow up. He is s/p MAZE and doing well.  He did undergo cardioversion for atypical atrial flutter in May.  He is maintaining sinus and doing well.  He has some fatigue on diltiazem. Today, he denies symptoms of palpitations, chest pain, orthopnea, PND, lower extremity edema, dizziness, presyncope, syncope, or neurologic sequela.  The patient feels that he is tolerating medications without difficulties and is otherwise without complaint today.   Past Medical History  Diagnosis Date  . Persistent atrial fibrillation (HCC)     a. s/p afib ablation 02/13/10 and redo ablation 10/26/12  . Atrial flutter [facility])     s/p CTI ablation 2006 by Ladona Ridgel  . Non-ischemic cardiomyopathy (HCC)     tachycardiac mediated  . ALLERGIC RHINITIS   . Rheumatic fever     childhood  . Chronic diastolic (congestive) heart failure (HCC)   . Essential hypertension   . Obesity   . CAD (coronary artery disease) 03/25/15    nonobstructive by cath  . Dysrhythmia     AFIB  . Sleep apnea     PATIENT STATES HE DOES NOT HAVE  . Shortness of breath dyspnea     W/ EXERTION   . Arthritis   . S/P Minimally invasive maze operation for atrial fibrillation 05/28/2015    Complete bilateral atrial lesion set using cryothermy and bipolar radiofrequency ablation with oversewing of LA appendage via right mini thoracotomy approach   . Atypical atrial flutter [facility])    Past Surgical History  Procedure Laterality Date  . Atrial ablation surgery  2006    CTI ablation  . Joint replacement Left     toe joint  . Tonsillectomy    . Atrial fibrillation ablation  02/13/10  . Tee without cardioversion N/A 10/25/2012    Procedure: TRANSESOPHAGEAL ECHOCARDIOGRAM (TEE);  Surgeon: Lewayne Bunting, MD;  Location[facility] ENDOSCOPY;  Service: Cardiovascular;  Laterality: N/A;  . Atrial fibrillation ablation N/A  10/26/2012    Procedure: ATRIAL FIBRILLATION ABLATION;  Surgeon: Hillis Range, MD;  Location: [facility] CATH LAB;  Service: Cardiovascular;  Laterality: N/A;  . Tee without cardioversion N/A 03/06/2015    Procedure: TRANSESOPHAGEAL ECHOCARDIOGRAM (TEE);  Surgeon: Thurmon Fair, MD;  Location: [facility] ENDOSCOPY;  Service: Cardiovascular;  Laterality: N/A;  . Cardioversion N/A 03/06/2015    Procedure: CARDIOVERSION;  Surgeon: Thurmon Fair, MD;  Location: [facility] ENDOSCOPY;  Service: Cardiovascular;  Laterality: N/A;  . Cardiac catheterization N/A 03/25/2015    Procedure: Left Heart Cath and Coronary Angiography;  Surgeon: Lennette Bihari, MD;  Location: [facility] INVASIVE CV LAB;  Service: Cardiovascular;  Laterality: N/A;  . Cardioversion N/A 04/15/2015    Procedure: CARDIOVERSION;  Surgeon: Vesta Mixer, MD;  Location: [facility] ENDOSCOPY;  Service: Cardiovascular;  Laterality: N/A;  . Minimally invasive maze procedure N/A 05/28/2015    Procedure: MINIMALLY INVASIVE MAZE PROCEDURE;  Surgeon: Purcell Nails, MD;  Location: [facility] OR;  Service: Open Heart Surgery;  Laterality: N/A;  . Tee without cardioversion N/A 05/28/2015    Procedure: TRANSESOPHAGEAL ECHOCARDIOGRAM (TEE);  Surgeon: Purcell Nails, MD;  Location: [facility] OR;  Service: Open Heart Surgery;  Laterality: N/A;  . Cardioversion N/A 07/17/2015    Procedure: CARDIOVERSION;  Surgeon: Jake Bathe, MD;  Location: [facility] ENDOSCOPY;  Service: Cardiovascular;  Laterality: N/A;    Current Outpatient Prescriptions  Medication Sig Dispense Refill  . amiodarone (PACERONE) 200  MG tablet Take 1 tablet (200 mg total) by mouth daily. 30 tablet 1  . apixaban (ELIQUIS) 5 MG TABS tablet Take 1 tablet (5 mg total) by mouth 2 (two) times daily. 180 tablet 3  . carvedilol (COREG) 25 MG tablet Take 1 tablet (25 mg total) by mouth 2 (two) times daily with a meal. 180 tablet 3  . rosuvastatin (CRESTOR) 5 MG tablet Take 1 tablet (5 mg total) by mouth daily. 90 tablet 3   No current  facility-administered medications for this visit.    Allergies  Allergen Reactions  . Penicillins Other (See Comments)    ## SYNCOPE ## (Tolerates amoxicillin) 05/28/15-Pt interview, reaction was "fainted after PCN as a child" Has patient had a PCN reaction causing immediate rash, facial/tongue/throat swelling, SOB or lightheadedness with hypotension: Yes Has patient had a PCN reaction causing severe rash involving mucus membranes or skin necrosis: No Has patient had a PCN reaction that required hospitalization No Has patient had a PCN reaction occurring within the last 10 years: No If all of the above answers are "NO  . Zinacef [Cefuroxime] Anaphylaxis    Patient reacted to 1 ml Test dose of Zinacef in OR.  . Cefuroxime Axetil Rash    Experienced redness and reaction in OR    Social History   Social History  . Marital Status: Married    Spouse Name: N/A  . Number of Children: 1  . 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 Smoking status: Never Smoker   . Smokeless tobacco: Never Used  . Alcohol Use: No  . Drug Use: No  . Sexual Activity: Not on file   Other Topics Concern  . Not on file   Social History Narrative   He is married and lives with his wife in [place].     Family History  Problem Relation Age of Onset  . Lung cancer Father     smoker  . Lung cancer Mother     smoker  . Diabetes Neg Hx   . Prostate cancer Neg Hx   . Colon cancer Neg Hx     ROS-  All systems are reviewed and are negative except as outlined in the HPI above  Physical Exam: Filed Vitals:   08/14/15 1542  BP: 118/78  Pulse: 60  Height: 5\' 7"  (1.702 m)  Weight: 214 lb 12.8 oz (97.433 kg)    GEN- The patient is well appearing, alert and oriented x 3 today.   Head- normocephalic, atraumatic Eyes-  Sclera clear, conjunctiva pink Ears- hearing intact Oropharynx- clear Neck- supple,   Lungs- Clear to ausculation bilaterally,  normal work of breathing Heart- tachycardic irregular rhythm GI- soft, NT, ND, + BS Extremities- no clubbing, cyanosis, or edema Neuro- strength and sensation are intact  EKG today reveals sinus rhythm, LAD, anterior infarct pattern Dr Barry Dienes notes reviewed  Assessment and Plan:  1. Persistent afib Now in sinus post maze Stop diltiazem No other changes Could consider reducing amiodarone to 100mg  daily soon (will defer to Dr Cornelius Moras) Theador Hawthorne score is 1-2.  He is s/p left atrial appendage amputation.  I would anticipate that we could probably stop eliquis when he sees Dr Cornelius Moras in 4 weeks.     2. Chronic systolic dysfunction EF acutely depressed, likely tachy mediated.  Previously normalized with sinus.  On coreg and ace inhibitor.  Repeat echo upon return.  3. HTN Stable No  change required today  4. CAD Nonobstructive  Follow-up with Lupita Leash in af clinic in 3 months Would order echo at that time Follow-up with Dr Cornelius Moras as scheduled in July  Hillis Range MD, [facility] 08/14/2015 4:56 PM

## 2015-08-28 ENCOUNTER — Telehealth (HOSPITAL_COMMUNITY): Payer: Self-pay | Admitting: *Deleted

## 2015-08-28 MED ORDER — FUROSEMIDE 40 MG PO TABS: ORAL_TABLET | ORAL | Status: DC

## 2015-08-28 NOTE — Telephone Encounter (Signed)
Pt called in stating his weight in up about 8lbs with increased abdominal girth and shortness of breath especially with activity. Pt states his HR/BP have been wnl and he is in normal rhythm.  Discussed with Rudi Coco NP will try lasix 40mg  for 3 days and then only as needed for weight gain >3-5lbs.  Pt to check back in on Monday with results of lasix.  Pt verbalized understanding. RX sent to pharmacy of choice.

## 2015-09-07 ENCOUNTER — Ambulatory Visit (INDEPENDENT_AMBULATORY_CARE_PROVIDER_SITE_OTHER): Payer: Managed Care, Other (non HMO) | Admitting: Thoracic Surgery (Cardiothoracic Vascular Surgery)

## 2015-09-07 ENCOUNTER — Other Ambulatory Visit: Payer: Self-pay | Admitting: *Deleted

## 2015-09-07 ENCOUNTER — Encounter: Payer: Self-pay | Admitting: Thoracic Surgery (Cardiothoracic Vascular Surgery)

## 2015-09-07 VITALS — BP 106/67 | HR 47 | Resp 16 | Ht 67.0 in | Wt 205.0 lb

## 2015-09-07 DIAGNOSIS — Z8679 Personal history of other diseases of the circulatory system: Secondary | ICD-10-CM

## 2015-09-07 DIAGNOSIS — I484 Atypical atrial flutter: Secondary | ICD-10-CM

## 2015-09-07 DIAGNOSIS — R931 Abnormal findings on diagnostic imaging of heart and coronary circulation: Secondary | ICD-10-CM

## 2015-09-07 DIAGNOSIS — Z9889 Other specified postprocedural states: Secondary | ICD-10-CM

## 2015-09-07 DIAGNOSIS — I482 Chronic atrial fibrillation, unspecified: Secondary | ICD-10-CM

## 2015-09-07 DIAGNOSIS — I5022 Chronic systolic (congestive) heart failure: Secondary | ICD-10-CM

## 2015-09-07 MED ORDER — CARVEDILOL 25 MG PO TABS: 12.5000 mg | ORAL_TABLET | Freq: Two times a day (BID) | ORAL | Status: DC

## 2015-09-07 NOTE — Patient Instructions (Addendum)
You may resume unrestricted physical activity without any particular limitations at this time.  Decrease carvedilol to 12.5 mg by mouth twice daily  Continue to monitor your pulse as you have been doing  Continue all other medications without any changes at this time

## 2015-09-07 NOTE — Progress Notes (Signed)
[address]       [name] [address]             [phone]     CARDIOTHORACIC SURGERY OFFICE NOTE  Referring Provider is Hillis Range, MD PCP is Sonda Primes, MD   HPI:  Patient returns to the office today for routine follow-up and rhythm check status post minimally invasive maze procedure on 05/28/2015.  He was last seen in our office on 07/13/2015 at which time he was in atypical atrial flutter. He underwent successful DC cardioversion on 07/17/2015 and has been maintaining sinus rhythm ever since. He was seen in follow-up recently by Dr. Johney Frame on 08/14/2015 at which time he was still in sinus rhythm. Since then the patient stop taking amiodarone. He returns to our office for follow-up today. He states that he has been feeling somewhat sluggish and recently was noted to have been retaining some fluid. He restarted taking Lasix last week and has lost several pounds in weight. He feels better but his resting pulse has remained quite slow, typically in the mid 40s. He has not had any palpitations or other symptoms to suggest a recurrence of atrial fibrillation or atrial flutter. He no longer has any significant pain in his chest and he is back doing all physical activities without any limitations other than that related to fatigue.   Current Outpatient Prescriptions  Medication Sig Dispense Refill  . apixaban (ELIQUIS) 5 MG TABS tablet Take 1 tablet (5 mg total) by mouth 2 (two) times daily. 180 tablet 3  . carvedilol (COREG) 25 MG tablet Take 1 tablet (25 mg total) by mouth 2 (two) times daily with a meal. 180 tablet 3  . furosemide (LASIX) 40 MG tablet Take 1 tablet by mouth for 3 days then only as needed for weight gain >3-5lbs 40 tablet 0  . rosuvastatin (CRESTOR) 5 MG tablet Take 1 tablet (5 mg total) by mouth daily. 90 tablet 3   No current facility-administered medications for this visit.      Physical Exam:   BP 106/67 mmHg  Pulse 47  Resp 16   Ht 5\' 7"  (1.702 m)  Wt 205 lb (92.987 kg)  BMI 32.10 kg/m2  SpO2 97%  General:  Well appearing  Chest:   Clear to auscultation  CV:   Regular rate and rhythm without murmur  Incisions:  Well-healed  Abdomen:  Soft nontender  Extremities:  Warm and well-perfused  Diagnostic Tests:  2 channel telemetry rhythm strip demonstrates sinus bradycardia with heart rate in the 40s   Impression:  Patient is clinically doing well and maintaining sinus rhythm more than 3 months status post minimally invasive maze procedure. He recently has complained of fatigue and some fluid retention, and his resting heart rate has been consistently less than 50. He otherwise looks quite good.  Plan:  I have suggested that the patient should cut his dose of carvedilol back to 12.5 mg twice daily and continue to check his resting pulse and weight on a regular basis. I have not recommended any other changes to his medications. It has been more than 3 months since his surgery and at this point I think it would be reasonable to check a routine follow-up echocardiogram to make sure that his ejection fraction has improved now that he is no longer in atrial fibrillation or atrial flutter.  The patient will continue to follow-up with Dr. Johney Frame and Rudi Coco in the atrial fibrillation clinic.  He will return to our office for routine follow-up next March, approximately 1 year following his original surgery. All of his questions have been addressed.   I spent in excess of 15 minutes during the conduct of this office consultation and >50% of this time involved direct face-to-face encounter with the patient for counseling and/or coordination of their care.    Salvatore Decent. Cornelius Moras, MD 09/07/2015 1:43 PM

## 2015-09-15 ENCOUNTER — Ambulatory Visit (HOSPITAL_COMMUNITY)
Admission: RE | Admit: 2015-09-15 | Discharge: 2015-09-15 | Disposition: A | Discharge status: Home / Self Care | Payer: Managed Care, Other (non HMO) | Source: Ambulatory Visit | Attending: Thoracic Surgery (Cardiothoracic Vascular Surgery) | Admitting: Thoracic Surgery (Cardiothoracic Vascular Surgery)

## 2015-09-15 DIAGNOSIS — R931 Abnormal findings on diagnostic imaging of heart and coronary circulation: Secondary | ICD-10-CM | POA: Insufficient documentation

## 2015-09-15 DIAGNOSIS — I509 Heart failure, unspecified: Secondary | ICD-10-CM | POA: Diagnosis present

## 2015-09-15 DIAGNOSIS — I358 Other nonrheumatic aortic valve disorders: Secondary | ICD-10-CM | POA: Diagnosis not present

## 2015-09-15 DIAGNOSIS — I2510 Atherosclerotic heart disease of native coronary artery without angina pectoris: Secondary | ICD-10-CM | POA: Insufficient documentation

## 2015-09-15 DIAGNOSIS — I110 Hypertensive heart disease with heart failure: Secondary | ICD-10-CM | POA: Insufficient documentation

## 2015-09-15 DIAGNOSIS — I5022 Chronic systolic (congestive) heart failure: Secondary | ICD-10-CM | POA: Diagnosis not present

## 2015-09-15 NOTE — Progress Notes (Signed)
  Echocardiogram 2D Echocardiogram has been performed.  [name] 09/15/2015, 3:51 PM

## 2015-12-18 ENCOUNTER — Ambulatory Visit (HOSPITAL_COMMUNITY)
Admission: RE | Admit: 2015-12-18 | Discharge: 2015-12-18 | Disposition: A | Discharge status: Home / Self Care | Payer: Managed Care, Other (non HMO) | Source: Ambulatory Visit | Attending: Nurse Practitioner | Admitting: Nurse Practitioner

## 2015-12-18 ENCOUNTER — Encounter (HOSPITAL_COMMUNITY): Payer: Self-pay | Admitting: Nurse Practitioner

## 2015-12-18 VITALS — BP 114/78 | HR 55 | Ht 67.0 in | Wt 212.4 lb

## 2015-12-18 DIAGNOSIS — Z6833 Body mass index (BMI) 33.0-33.9, adult: Secondary | ICD-10-CM | POA: Diagnosis not present

## 2015-12-18 DIAGNOSIS — I2510 Atherosclerotic heart disease of native coronary artery without angina pectoris: Secondary | ICD-10-CM | POA: Diagnosis not present

## 2015-12-18 DIAGNOSIS — G4730 Sleep apnea, unspecified: Secondary | ICD-10-CM | POA: Insufficient documentation

## 2015-12-18 DIAGNOSIS — I4892 Unspecified atrial flutter: Secondary | ICD-10-CM | POA: Diagnosis not present

## 2015-12-18 DIAGNOSIS — I429 Cardiomyopathy, unspecified: Secondary | ICD-10-CM | POA: Diagnosis not present

## 2015-12-18 DIAGNOSIS — Z801 Family history of malignant neoplasm of trachea, bronchus and lung: Secondary | ICD-10-CM | POA: Insufficient documentation

## 2015-12-18 DIAGNOSIS — M1990 Unspecified osteoarthritis, unspecified site: Secondary | ICD-10-CM | POA: Diagnosis not present

## 2015-12-18 DIAGNOSIS — E669 Obesity, unspecified: Secondary | ICD-10-CM | POA: Insufficient documentation

## 2015-12-18 DIAGNOSIS — Z881 Allergy status to other antibiotic agents status: Secondary | ICD-10-CM | POA: Insufficient documentation

## 2015-12-18 DIAGNOSIS — I481 Persistent atrial fibrillation: Secondary | ICD-10-CM | POA: Insufficient documentation

## 2015-12-18 DIAGNOSIS — I110 Hypertensive heart disease with heart failure: Secondary | ICD-10-CM | POA: Insufficient documentation

## 2015-12-18 DIAGNOSIS — Z955 Presence of coronary angioplasty implant and graft: Secondary | ICD-10-CM | POA: Insufficient documentation

## 2015-12-18 DIAGNOSIS — I5032 Chronic diastolic (congestive) heart failure: Secondary | ICD-10-CM | POA: Diagnosis not present

## 2015-12-18 DIAGNOSIS — Z9889 Other specified postprocedural states: Secondary | ICD-10-CM | POA: Insufficient documentation

## 2015-12-18 DIAGNOSIS — Z880 Allergy status to penicillin: Secondary | ICD-10-CM | POA: Insufficient documentation

## 2015-12-18 DIAGNOSIS — Z8679 Personal history of other diseases of the circulatory system: Secondary | ICD-10-CM | POA: Diagnosis not present

## 2015-12-18 DIAGNOSIS — R001 Bradycardia, unspecified: Secondary | ICD-10-CM | POA: Insufficient documentation

## 2015-12-18 MED ORDER — FUROSEMIDE 40 MG PO TABS: ORAL_TABLET | ORAL | 1 refills | Status: DC

## 2015-12-18 NOTE — Progress Notes (Signed)
Patient ID: [name], male   DOB: Jul 10, 1955, 60 y.o.   MRN: [id number]     Primary Care Physician: Sonda Primes, MD Referring Physician: Dr. Cornelius Moras EP: Dr. Burna Cash [name] is a 60 y.o. male with a h/o minimally invasive maze procedure on 05/28/2015. Early postoperative recovery in the hospital was uncomplicated. He was last seen by Dr. Cornelius Moras today at which time he was doing well but appeared to be in atrial flutter with heart rate 115 bpm. Last week he underwent a 12-lead EKG in the atrial fibrillation clinic which confirmed the presence of atypical atrial flutter.  The patient states that he is doing well, although he has noted that he now is getting tired with activity. His pulse has remain unchanged despite increased dose of carvedilol and Cardizem CD. He otherwise feels well. He has very mild residual soreness in his chest.He is s/p 3 ablations by Dr. Johney Frame and multiple cardioversion's in the past. He has not missed any blood thinners x 3 weeks.  He returned to the af clinic 5/25 one week after successful cardioversion. He has been maintaining SR but now is on the slow side and has noticed some soft blood pressures with mild dizziness. He has Sinus brady at 53 bpm today. weight is up 5 lbs, possibly due to increased dose of cardizem. He is feeling better with more energy and less shortness of breath with activities.Cardizem was decreased and ultimately stopped.   Returns 10/20 for f/u maze procedure and reports he is doing great without any reoccurrence of afib. When he saw Dr. Cornelius Moras 7/10, he was c/o fatigue with a HR in the 40's and carvedilol was decreased to 12.5 mg bid. He had a post op echo and did show EF had returned to normal range. He occasionally will retain fluid and takes lasix as needed. Dr. Johney Frame indicated that he would stop his blood thinner when he saw Dr. Cornelius Moras in July, Dr. Cornelius Moras did not have any issues to stop but would let Dr. Johney Frame make final call. He does have  CHA2DS2VASc score of 1(htn) and has had LAA clipped and should be ok to stop anticoagulant.  Today, he denies symptoms of palpitations, chest pain, shortness of breath, orthopnea, PND, lower extremity edema, dizziness, presyncope, syncope, or neurologic sequela. The patient is tolerating medications without difficulties and is otherwise without complaint today.   Past Medical History:  Diagnosis Date  . ALLERGIC RHINITIS   . Arthritis   . Atrial flutter [facility])    s/p CTI ablation 2006 by Ladona Ridgel  . Atypical atrial flutter (HCC)   . CAD (coronary artery disease) 03/25/15   nonobstructive by cath  . Chronic diastolic (congestive) heart failure   . Dysrhythmia    AFIB  . Essential hypertension   . Non-ischemic cardiomyopathy (HCC)    tachycardiac mediated  . Obesity   . Persistent atrial fibrillation (HCC)    a. s/p afib ablation 02/13/10 and redo ablation 10/26/12  . Rheumatic fever    childhood  . S/P Minimally invasive maze operation for atrial fibrillation 05/28/2015   Complete bilateral atrial lesion set using cryothermy and bipolar radiofrequency ablation with oversewing of LA appendage via right mini thoracotomy approach   . Shortness of breath dyspnea    W/ EXERTION   . Sleep apnea    PATIENT STATES HE DOES NOT HAVE   Past Surgical History:  Procedure Laterality Date  . ATRIAL ABLATION SURGERY  2006   CTI ablation  .  atrial fibrillation ablation  02/13/10  . ATRIAL FIBRILLATION ABLATION N/A 10/26/2012   Procedure: ATRIAL FIBRILLATION ABLATION;  Surgeon: Hillis RangeJames Allred, MD;  [facility];  Service: Cardiovascular;  Laterality: N/A;  . CARDIAC CATHETERIZATION N/A 03/25/2015   Procedure: Left Heart Cath and Coronary Angiography;  Surgeon: Lennette Biharihomas A Kelly, MD;  Location: [facility] INVASIVE CV LAB;  Service: Cardiovascular;  Laterality: N/A;  . CARDIOVERSION N/A 03/06/2015   Procedure: CARDIOVERSION;  Surgeon: Thurmon FairMihai Croitoru, MD;  Location: [facility] ENDOSCOPY;  Service: Cardiovascular;   Laterality: N/A;  . CARDIOVERSION N/A 04/15/2015   Procedure: CARDIOVERSION;  Surgeon: Vesta MixerPhilip J Nahser, MD;  [facility];  Service: Cardiovascular;  Laterality: N/A;  . CARDIOVERSION N/A 07/17/2015   Procedure: CARDIOVERSION;  Surgeon: Jake BatheMark C Skains, MD;  [facility];  Service: Cardiovascular;  Laterality: N/A;  . JOINT REPLACEMENT Left    toe joint  . MINIMALLY INVASIVE MAZE PROCEDURE N/A 05/28/2015   Procedure: MINIMALLY INVASIVE MAZE PROCEDURE;  Surgeon: Purcell Nailslarence H Owen, MD;  Location: [facility] OR;  Service: Open Heart Surgery;  Laterality: N/A;  . TEE WITHOUT CARDIOVERSION N/A 10/25/2012   Procedure: TRANSESOPHAGEAL ECHOCARDIOGRAM (TEE);  Surgeon: Lewayne BuntingBrian S Crenshaw, MD;  [facility];  Service: Cardiovascular;  Laterality: N/A;  . TEE WITHOUT CARDIOVERSION N/A 03/06/2015   Procedure: TRANSESOPHAGEAL ECHOCARDIOGRAM (TEE);  Surgeon: Thurmon FairMihai Croitoru, MD;  [facility];  Service: Cardiovascular;  Laterality: N/A;  . TEE WITHOUT CARDIOVERSION N/A 05/28/2015   Procedure: TRANSESOPHAGEAL ECHOCARDIOGRAM (TEE);  Surgeon: Purcell Nailslarence H Owen, MD;  [facility];  Service: Open Heart Surgery;  Laterality: N/A;  . TONSILLECTOMY      Current Outpatient Prescriptions  Medication Sig Dispense Refill  . carvedilol (COREG) 25 MG tablet Take 0.5 tablets (12.5 mg total) by mouth 2 (two) times daily with a meal. 180 tablet 3  . furosemide (LASIX) 40 MG tablet Take one tablet only as needed for weight gain >3-5lbs 30 tablet 1  . rosuvastatin (CRESTOR) 5 MG tablet Take 1 tablet (5 mg total) by mouth daily. 90 tablet 3   No current facility-administered medications for this encounter.     Allergies  Allergen Reactions  . Penicillins Other (See Comments)    ## SYNCOPE ## (Tolerates amoxicillin) 05/28/15-Pt interview, reaction was "fainted after PCN as a child" Has patient had a PCN reaction causing immediate rash, facial/tongue/throat swelling, SOB or lightheadedness with  hypotension: Yes Has patient had a PCN reaction causing severe rash involving mucus membranes or skin necrosis: No Has patient had a PCN reaction that required hospitalization No Has patient had a PCN reaction occurring within the last 10 years: No If all of the above answers are "NO  . Zinacef [Cefuroxime] Anaphylaxis    Patient reacted to 1 ml Test dose of Zinacef in OR.  . Cefuroxime Axetil Rash    Experienced redness and reaction in OR    Social History   Social History  . Marital status: Married    Spouse name: N/A  . Number of children: 1  . Years of education: N/A   Occupational History  . 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 Smoking status: Never Smoker  . Smokeless tobacco: Never Used  . Alcohol use No  . Drug use: No  . Sexual activity: Not on file   Other Topics Concern  . Not on file   Social History Narrative   He is married and lives [place] in [place].  Family History  Problem Relation Age of Onset  . Lung cancer Father     smoker  . Lung cancer Mother     smoker  . Diabetes Neg Hx   . Prostate cancer Neg Hx   . Colon cancer Neg Hx     ROS- All systems are reviewed and negative except as per the HPI above  Physical Exam: Vitals:   12/18/15 1027  BP: 114/78  Pulse: (!) 55  Weight: 212 lb 6.4 oz (96.3 kg)  Height: 5\' 7"  (1.702 m)    GEN- The patient is well appearing, alert and oriented x 3 today.   Head- normocephalic, atraumatic Eyes-  Sclera clear, conjunctiva pink Ears- hearing intact Oropharynx- clear Neck- supple, no JVP Lymph- no cervical lymphadenopathy Lungs- Clear to ausculation bilaterally, normal work of breathing Heart- Slow, regular rate and rhythm, no murmurs, rubs or gallops, PMI not laterally displaced GI- soft, NT, ND, + BS Extremities- no clubbing, cyanosis, or edema MS- no significant deformity or atrophy Skin- no rash or lesion Psych- euthymic mood, full affect Neuro- strength and  sensation are intact  EKG- sinus brady at 55 bpm, LAD, 55 bpm,   pr int 196 ms, qrs int 96 bpm, qtc 424 ms Epic records reviewed ECHO-- Left ventricle: The cavity size was normal. Wall thickness was   increased in a pattern of mild LVH. Systolic function was normal.   The estimated ejection fraction was in the range of 60% to 65%.   The study is not technically sufficient to allow evaluation of LV   diastolic function. - Aortic valve: Trileaflet. Sclerosis without stenosis. There was   trivial regurgitation. - Left atrium: The atrium was normal in size. - Right ventricle: The cavity size was mildly dilated. Systolic   function was normal. - Right atrium: The atrium was mildly dilated. - Inferior vena cava: The vessel was normal in size. The   respirophasic diameter changes were in the normal range (= 50%),   consistent with normal central venous pressure.  Impressions:  - Compared to a prior study in 04/2015, the LVEF has normalized.  Assessment and Plan: 1.  Persistent atypical atrial flutter  s /p minimally invasive maze procedure  05/28/15                              Successful  Cardioversion  5/19, maintaining SR since then Off cardizem Off amiodarone Continue carvedilol at 12.5 mg bid, but if HR becomes slow in the low 50's or more fatigue can decrease again to 6.25 mg bid, now that EF has improved to normal in SR. Per Dr. Jenel Lucks last note,( see note of 6/16) he anticipated pt coming off anticoagulant and pt can stop drug with low chadsvasc score and LAA amputation Lasix as needed   2. Bradycardia  Improved with reduction of rate control  F/u Dr. Cornelius Moras 06/06/16 and anticipate seeing pt in one year from that date for long term surveillance of maze/afib    Lupita Leash [facility] [address] [place], [place] [address] [phone]

## 2016-03-08 ENCOUNTER — Other Ambulatory Visit (HOSPITAL_COMMUNITY): Payer: Self-pay | Admitting: Nurse Practitioner

## 2016-05-06 ENCOUNTER — Other Ambulatory Visit (HOSPITAL_COMMUNITY): Payer: Self-pay | Admitting: Nurse Practitioner

## 2016-06-06 ENCOUNTER — Encounter: Payer: Managed Care, Other (non HMO) | Admitting: Thoracic Surgery (Cardiothoracic Vascular Surgery)

## 2016-06-13 ENCOUNTER — Encounter: Payer: Self-pay | Admitting: Thoracic Surgery (Cardiothoracic Vascular Surgery)

## 2016-06-13 ENCOUNTER — Ambulatory Visit (INDEPENDENT_AMBULATORY_CARE_PROVIDER_SITE_OTHER): Payer: Managed Care, Other (non HMO) | Admitting: Thoracic Surgery (Cardiothoracic Vascular Surgery)

## 2016-06-13 VITALS — BP 132/76 | HR 58 | Resp 20 | Ht 67.0 in | Wt 219.0 lb

## 2016-06-13 DIAGNOSIS — Z8679 Personal history of other diseases of the circulatory system: Secondary | ICD-10-CM

## 2016-06-13 DIAGNOSIS — I482 Chronic atrial fibrillation, unspecified: Secondary | ICD-10-CM

## 2016-06-13 DIAGNOSIS — I4819 Other persistent atrial fibrillation: Secondary | ICD-10-CM

## 2016-06-13 DIAGNOSIS — I5022 Chronic systolic (congestive) heart failure: Secondary | ICD-10-CM

## 2016-06-13 DIAGNOSIS — I481 Persistent atrial fibrillation: Secondary | ICD-10-CM

## 2016-06-13 DIAGNOSIS — Z9889 Other specified postprocedural states: Secondary | ICD-10-CM | POA: Diagnosis not present

## 2016-06-13 NOTE — Progress Notes (Signed)
[address]       [name] [address]             [phone]     CARDIOTHORACIC SURGERY OFFICE NOTE  Referring Provider is Hillis Range, MD PCP is Sonda Primes, MD   HPI:  Patient is a 61 year old moderately obese white male who returns to the office today for routine follow-up approximately one year as post minimally invasive Maze procedure on 05/28/2015. His early postoperative recovery was notable for the development of atypical atrial flutter for which he underwent successful DC cardioversion on 07/17/2015. Since then he has been maintaining sinus rhythm. He was last seen in our office on 09/07/2015. Shortly after that he underwent routine follow-up echocardiogram which revealed normalization of left ventricular function with ejection fraction estimated 60-65%.  He was last seen in follow-up by Rudi Coco in the atrial fibrillation clinic on 12/18/2015 at which time he was maintaining sinus rhythm. Long-term anticoagulation has been stopped. He returns to our office for follow-up today.  He reports that he is doing very well. He has not had any palpitations or other signs or symptoms to suggest a recurrence of atrial fibrillation. He does not exercise on a regular basis but he plans to begin doing so in the near future. He denies any symptoms of exertional chest pain or chest tightness. He has not had any exertional shortness of breath. He is quite pleased with his progress.   Current Outpatient Prescriptions  Medication Sig Dispense Refill  . carvedilol (COREG) 25 MG tablet Take 0.5 tablets (12.5 mg total) by mouth 2 (two) times daily with a meal. 180 tablet 3  . furosemide (LASIX) 40 MG tablet TAKE ONE TABLET ONLY AS NEEDED FOR WEIGHT GAIN >3-5LBS 30 tablet 1   No current facility-administered medications for this visit.       Physical Exam:   BP 132/76   Pulse (!) 58   Resp 20   Ht 5\' 7"  (1.702 m)   Wt 219 lb (99.3 kg)   SpO2 98% Comment: RA   BMI 34.30 kg/m   General:  Well-appearing  Chest:   Clear to auscultation  CV:   Regular rate and rhythm  Incisions:  n/a  Abdomen:  Soft nontender  Extremities:  Warm and well-perfused  Diagnostic Tests:  2 channel telemetry rhythm strip demonstrates normal sinus rhythm    Transthoracic Echocardiography  Patient:    Eliazar, Dankenbring MR #:       [id number] Study Date: 09/15/2015 Gender:     M Age:        61 Height:     170.2 cm Weight:     93 kg BSA:        2.13 m^2 Pt. Status: Room:   ATTENDING    Tressie Stalker, M.D.  ORDERING     Tressie Stalker, M.D.  REFERRING    Tressie Stalker, M.D.  PERFORMING   [facility], Outpatient  SONOGRAPHER  Thurman Coyer  cc:  ------------------------------------------------------------------- LV EF: 60% -   65%  ------------------------------------------------------------------- Indications:      CHF - 428.0.  ------------------------------------------------------------------- History:   PMH:   Atrial fibrillation.  Coronary artery disease. Risk factors:  Hypertension.  ------------------------------------------------------------------- Study Conclusions  - Left ventricle: The cavity size was normal. Wall thickness was   increased in a pattern of mild LVH. Systolic function was normal.   The estimated ejection fraction was in the range of 60% to 65%.   The  study is not technically sufficient to allow evaluation of LV   diastolic function. - Aortic valve: Trileaflet. Sclerosis without stenosis. There was   trivial regurgitation. - Left atrium: The atrium was normal in size. - Right ventricle: The cavity size was mildly dilated. Systolic   function was normal. - Right atrium: The atrium was mildly dilated. - Inferior vena cava: The vessel was normal in size. The   respirophasic diameter changes were in the normal range (= 50%),   consistent with normal central venous pressure.  Impressions:  - Compared to a prior  study in 04/2015, the LVEF has normalized.  ------------------------------------------------------------------- Labs, prior tests, procedures, and surgery: Surgery.    Maze procedure.  ------------------------------------------------------------------- Study data:  Comparison was made to the study of 02/20/2013.  Study status:  Routine.  Procedure:  The patient reported no pain pre or post test. Transthoracic echocardiography. Image quality was adequate.  Study completion:  There were no complications. Transthoracic echocardiography.  M-mode, complete 2D, spectral Doppler, and color Doppler.  Birthdate:  Patient birthdate: 1955-09-15.  Age:  Patient is 61 yr old.  Sex:  Gender: male. BMI: 32.1 kg/m^2.  Blood pressure:     125/71  Patient status: Outpatient.  Study date:  Study date: 09/15/2015. Study time: 03:05 PM.  Location:  Echo laboratory.  -------------------------------------------------------------------  ------------------------------------------------------------------- Left ventricle:  The cavity size was normal. Wall thickness was increased in a pattern of mild LVH. Systolic function was normal. The estimated ejection fraction was in the range of 60% to 65%. Images were inadequate for LV wall motion assessment. The study is not technically sufficient to allow evaluation of LV diastolic function.  ------------------------------------------------------------------- Aortic valve:   Trileaflet. Sclerosis without stenosis.  Doppler: There was trivial regurgitation.  ------------------------------------------------------------------- Aorta:  Aortic root: The aortic root was normal in size. Ascending aorta: The ascending aorta was normal in size.  ------------------------------------------------------------------- Mitral valve:   Structurally normal valve.   Leaflet separation was normal.  Doppler:  Transvalvular velocity was within the normal range. There was no  evidence for stenosis. There was no regurgitation.    Peak gradient (D): 4 mm Hg.  ------------------------------------------------------------------- Left atrium:  The atrium was normal in size.  ------------------------------------------------------------------- Right ventricle:  The cavity size was mildly dilated. Systolic function was normal.  ------------------------------------------------------------------- Pulmonic valve:   Poorly visualized.  Doppler:  There was no significant regurgitation.  ------------------------------------------------------------------- Tricuspid valve:   Doppler:  There was no significant regurgitation.  ------------------------------------------------------------------- Pulmonary artery:   Poorly visualized.  ------------------------------------------------------------------- Right atrium:  The atrium was mildly dilated.  ------------------------------------------------------------------- Pericardium:  There was no pericardial effusion.  ------------------------------------------------------------------- Systemic veins: Inferior vena cava: The vessel was normal in size. The respirophasic diameter changes were in the normal range (= 50%), consistent with normal central venous pressure.  ------------------------------------------------------------------- Measurements   Left ventricle                          Value        Reference  LV ID, ED, PLAX chordal                 52    mm     43 - 52  LV ID, ES, PLAX chordal          (H)    40    mm     23 - 38  LV fx shortening, PLAX chordal   (L)    23    %      >=  29  LV PW thickness, ED                     17.1  mm     ---------  IVS/LV PW ratio, ED                     0.61         <=1.3  Stroke volume, 2D                       49    ml     ---------  Stroke volume/bsa, 2D                   23    ml/m^2 ---------  LV ejection fraction, 1-p A4C           74    %      ---------  LV  end-diastolic volume, 2-p            95    ml     ---------  LV end-systolic volume, 2-p             22    ml     ---------  LV ejection fraction, 2-p               77    %      ---------  Stroke volume, 2-p                      74    ml     ---------  LV end-diastolic volume/bsa, 2-p        45    ml/m^2 ---------  LV end-systolic volume/bsa, 2-p         10    ml/m^2 ---------  Stroke volume/bsa, 2-p                  34.5  ml/m^2 ---------  LV e&', lateral                          10.1  cm/s   ---------  LV E/e&', lateral                        10.2         ---------  Longitudinal strain, TDI                24    %      ---------    Ventricular septum                      Value        Reference  IVS thickness, ED                       10.5  mm     ---------    LVOT                                    Value        Reference  LVOT ID, S                              18    mm     ---------  LVOT area                               2.54  cm^2   ---------  LVOT peak velocity, S                   83.9  cm/s   ---------  LVOT mean velocity, S                   54.2  cm/s   ---------  LVOT VTI, S                             19.2  cm     ---------    Aorta                                   Value        Reference  Aortic root ID, ED                      30    mm     ---------    Left atrium                             Value        Reference  LA ID, A-P, ES                          52    mm     ---------  LA ID/bsa, A-P                   (H)    2.44  cm/m^2 <=2.2  LA volume, S                            46.4  ml     ---------  LA volume/bsa, S                        21.8  ml/m^2 ---------  LA volume, ES, 1-p A4C                  44.2  ml     ---------  LA volume/bsa, ES, 1-p A4C              20.8  ml/m^2 ---------  LA volume, ES, 1-p A2C                  47.9  ml     ---------  LA volume/bsa, ES, 1-p A2C              22.5  ml/m^2 ---------    Mitral valve                            Value         Reference  Mitral E-wave peak velocity             103   cm/s   ---------  Mitral deceleration time         (H)    292   ms     150 -  230  Mitral peak gradient, D                 4     mm Hg  ---------    Right ventricle                         Value        Reference  TAPSE                                   12.4  mm     ---------  RV s&', lateral, S                       11.1  cm/s   ---------  Legend: (L)  and  (H)  mark values outside specified reference range.  ------------------------------------------------------------------- Prepared and Electronically Authenticated by  Zoila Shutter MD 2017-07-18T19:07:51   Impression:  Patient is doing very well and maintaining sinus rhythm approximately one year status post minimally invasive Maze procedure denies any symptoms of exertional shortness of breath, chest discomfort, or palpitations.  Plan:  The patient will continue to follow-up on annual basis with Rudi Coco in the atrial fibrillation clinic. We have not recommended any changes to his current medications. In the future he will call and return to see [place] only should specific problems or questions arise.  I spent in excess of 15 minutes during the conduct of this office consultation and >50% of this time involved direct face-to-face encounter with the patient for counseling and/or coordination of their care.    Salvatore Decent. Cornelius Moras, MD 06/13/2016 3:27 PM

## 2016-06-13 NOTE — Patient Instructions (Signed)
Continue all previous medications without any changes at this time  

## 2016-07-06 ENCOUNTER — Other Ambulatory Visit (HOSPITAL_COMMUNITY): Payer: Self-pay | Admitting: Nurse Practitioner

## 2016-10-27 ENCOUNTER — Other Ambulatory Visit: Payer: Self-pay | Admitting: Thoracic Surgery (Cardiothoracic Vascular Surgery)

## 2016-11-07 ENCOUNTER — Encounter: Payer: Self-pay | Admitting: Family Medicine

## 2016-11-07 ENCOUNTER — Ambulatory Visit (INDEPENDENT_AMBULATORY_CARE_PROVIDER_SITE_OTHER): Payer: Worker's Compensation | Admitting: Family Medicine

## 2016-11-07 ENCOUNTER — Ambulatory Visit (INDEPENDENT_AMBULATORY_CARE_PROVIDER_SITE_OTHER): Payer: Worker's Compensation

## 2016-11-07 VITALS — BP 122/82 | HR 58 | Temp 98.0°F | Resp 16 | Ht 66.93 in | Wt 217.0 lb

## 2016-11-07 DIAGNOSIS — M25462 Effusion, left knee: Secondary | ICD-10-CM

## 2016-11-07 DIAGNOSIS — M25562 Pain in left knee: Secondary | ICD-10-CM

## 2016-11-07 MED ORDER — MELOXICAM 7.5 MG PO TABS: 7.5000 mg | ORAL_TABLET | Freq: Every day | ORAL | 0 refills | Status: DC | PRN

## 2016-11-07 NOTE — Progress Notes (Addendum)
Subjective:    Patient ID: [name], male    DOB: [birth year], 61 y.o.   MRN: [id number]  HPI  [name] is a 61 y.o. male Presents today for: Chief Complaint  Patient presents with  . Work Related Injury    pt states he has been having Left Knee pain since 2018 from twisting the knee at work.    presents with left knee pain due to injury that occurred at work. 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 DOI Friday 11/04/16 at around 8:30.  Walking down incline, wet grass under R foot, gravel under L foot.  R foot slipped, causing L knee to twist. No audible pop, just felt warm, stiff.  Able to continue to work, but more stiffness and warmth in area as day proceeded. More difficulty with bending that night. Able to weight bear, but stiff and constant ache. No prior L knee injury/injection/surgery. No locking/giving way.  Just pain to bend back. Pain on inside of knee. Swelling has improved some.   Tx: alleve x2, then  ibuprofen - min difference in pain. Icing initially.    Hx of Afib, s/p MAZE procedure in March 2017. Not on coreg or lasix regularly, has not been told to avoid NSIADs.    Allergies  Allergen Reactions  . Penicillins Other (See Comments)    ## SYNCOPE ## (Tolerates amoxicillin) 05/28/15-Pt interview, reaction was "fainted after PCN as a child" Has patient had a PCN reaction causing immediate rash, facial/tongue/throat swelling, SOB or lightheadedness with hypotension: Yes Has patient had a PCN reaction causing severe rash involving mucus membranes or skin necrosis: No Has patient had a PCN reaction that required hospitalization No Has patient had a PCN reaction occurring within the last 10 years: No If all of the above answers are "NO  . Zinacef [Cefuroxime] Anaphylaxis    Patient reacted to 1 ml Test dose of Zinacef in OR.  . Cefuroxime Axetil Rash    Experienced redness and reaction in OR   Prior to Admission medications   Medication Sig Start Date End Date  Taking? Authorizing Provider  carvedilol (COREG) 25 MG tablet Take 0.5 tablets (12.5 mg total) by mouth 2 (two) times daily with a meal. 09/07/15  Yes Purcell Nails, MD  furosemide (LASIX) 40 MG tablet TAKE ONE TABLET ONLY AS NEEDED FOR WEIGHT GAIN >3-5LBS 07/06/16  Yes Newman Nip, NP     Review of Systems     Objective:   Physical Exam  Constitutional: He is oriented to person, place, and time. He appears well-developed and well-nourished. No distress.  Cardiovascular: Normal rate.   Musculoskeletal:       Left knee: He exhibits decreased range of motion (Full extension, flexion to 90.), swelling, effusion, bony tenderness (medial proximal tibia. fibula NT. ), abnormal meniscus (pain with mcmurray,) and MCL laxity (guarded exam, pain with MCL testing, but no apparent laxity.). He exhibits no ecchymosis, no deformity and no erythema. Tenderness found. Medial joint line tenderness noted. No lateral joint line, no MCL and no LCL tenderness noted.  NVI distally  Neurological: He is alert and oriented to person, place, and time.  Skin: Skin is warm and dry. No rash noted. No erythema.  No wound.   Psychiatric: He has a normal mood and affect. His behavior is normal.  Vitals reviewed.  Vitals:   11/07/16 1128  BP: 122/82  Pulse: (!) 58  Resp: 16  Temp: 98 F (36.7 C)  TempSrc: Oral  SpO2:  96%  Weight: 217 lb (98.4 kg)  Height: 5' 6.93" (1.7 m)   Dg Knee Complete 4 Views Left  Result Date: 11/07/2016 CLINICAL DATA:  LEFT knee pain and swelling after a twisting injury 3 days ago EXAM: LEFT KNEE - COMPLETE 4+ VIEW COMPARISON:  None FINDINGS: Question mild osseous demineralization. Mild patellofemoral degenerative changes. Spurring at the posterior aspect of the knee joint on lateral view. No acute fracture, dislocation, or bone destruction. No definite knee joint effusion. IMPRESSION: Mild degenerative changes LEFT knee without acute bony abnormalities. Electronically Signed   By:  Ulyses Southward M.D.   On: 11/07/2016 12:27        Assessment & Plan:    [name] is a 61 y.o. male Left medial knee pain - Plan: DG Knee Complete 4 Views Left, meloxicam (MOBIC) 7.5 MG tablet, Apply knee sleeve  Swelling of joint, knee, left - Plan: DG Knee Complete 4 Views Left, meloxicam (MOBIC) 7.5 MG tablet, Apply knee sleeve  Left knee pain, swelling due to injury at work that occurred on September 7. Twisting injury with medial pain, swelling, suspicious for MCL injury versus meniscal tear. No sign of fracture on x-ray.  -Start meloxicam 7.5 mg daily when necessary, elevation, hinged knee brace  -Bending/squatting/prolonged standing restrictions provided with work note  -Recheck in 1 week. If not significant improvement by that time, may need orthopedic evaluation or advanced imaging with MRI. RTC precautions if worsening sooner.   Meds ordered this encounter  Medications  . meloxicam (MOBIC) 7.5 MG tablet    Sig: Take 1 tablet (7.5 mg total) by mouth daily as needed for pain.    Dispense:  30 tablet    Refill:  0   Patient Instructions    X-ray did not indicate any fracture, there were some degenerative changes or arthritis.   Wear knee brace, I will place some restrictions on bending and squatting for now, recheck in 1 week. Meloxicam once per day as needed, do not combine that with other anti-inflammatory such as Advil or Aleve. Tylenol is fine to take that medication.  Return to the clinic or go to the nearest emergency room if any of your symptoms worsen or new symptoms occur.   Knee Pain, Adult Knee pain in adults is common. It can be caused by many things, including:  Arthritis.  A fluid-filled sac (cyst) or growth in your knee.  An infection in your knee.  An injury that will not heal.  Damage, swelling, or irritation of the tissues that support your knee.  Knee pain is usually not a sign of a serious problem. The pain may go away on its own with time  and rest. If it does not, a health care provider may order tests to find the cause of the pain. These may include:  Imaging tests, such as an X-ray, MRI, or ultrasound.  Joint aspiration. In this test, fluid is removed from the knee.  Arthroscopy. In this test, a lighted tube is inserted into knee and an image is projected onto a TV screen.  A biopsy. In this test, a sample of tissue is removed from the body and studied under a microscope.  Follow these instructions at home: Pay attention to any changes in your symptoms. Take these actions to relieve your pain. Activity  Rest your knee.  Do not do things that cause pain or make pain worse.  Avoid high-impact activities or exercises, such as running, jumping rope, or doing jumping  jacks. General instructions  Take over-the-counter and prescription medicines only as told by your health care provider.  Raise (elevate) your knee above the level of your heart when you are sitting or lying down.  Sleep with a pillow under your knee.  If directed, apply ice to the knee: ? Put ice in a plastic bag. ? Place a towel between your skin and the bag. ? Leave the ice on for 20 minutes, 2-3 times a day.  Ask your health care provider if you should wear an elastic knee support.  Lose weight if you are overweight. Extra weight can put pressure on your knee.  Do not use any products that contain nicotine or tobacco, such as cigarettes and e-cigarettes. Smoking may slow the healing of any bone and joint problems that you may have. If you need help quitting, ask your health care provider. Contact a health care provider if:  Your knee pain continues, changes, or gets worse.  You have a fever along with knee pain.  Your knee buckles or locks up.  Your knee swells, and the swelling becomes worse. Get help right away if:  Your knee feels warm to the touch.  You cannot move your knee.  You have severe pain in your knee.  You have chest  pain.  You have trouble breathing. Summary  Knee pain in adults is common. It can be caused by many things, including, arthritis, infection, cysts, or injury.  Knee pain is usually not a sign of a serious problem, but if it does not go away, a health care provider may perform tests to know the cause of the pain.  Pay attention to any changes in your symptoms. Relieve your pain with rest, medicines, light activity, and use of ice.  Get help if your pain continues or becomes very severe, or if your knee buckles or locks up, or if you have chest pain or trouble breathing. This information is not intended to replace advice given to you by your health care provider. Make sure you discuss any questions you have with your health care provider. Document Released: 12/12/2006 Document Revised: 02/05/2016 Document Reviewed: 02/05/2016 Elsevier Interactive Patient Education  2018 [employer].    IF you received an x-ray today, you will receive an invoice from [facility]. Please contact [facility] at [phone] with questions or concerns regarding your invoice.   IF you received labwork today, you will receive an invoice from [place]. Please contact [facility] at [phone] with questions or concerns regarding your invoice.   Our billing staff will not be able to assist you with questions regarding bills from these companies.  You will be contacted with the lab results as soon as they are available. The fastest way to get your results is to activate your My Chart account. Instructions are located on the last page of this paperwork. If you have not heard from [place] regarding the results in 2 weeks, please contact this office.       Signed,   Meredith Staggers, MD Primary Care at [facility].  11/07/16 1:33 PM

## 2016-11-07 NOTE — Patient Instructions (Addendum)
X-ray did not indicate any fracture, there were some degenerative changes or arthritis.   Wear knee brace, I will place some restrictions on bending and squatting for now, recheck in 1 week. Meloxicam once per day as needed, do not combine that with other anti-inflammatory such as Advil or Aleve. Tylenol is fine to take that medication.  Return to the clinic or go to the nearest emergency room if any of your symptoms worsen or new symptoms occur.   Knee Pain, Adult Knee pain in adults is common. It can be caused by many things, including:  Arthritis.  A fluid-filled sac (cyst) or growth in your knee.  An infection in your knee.  An injury that will not heal.  Damage, swelling, or irritation of the tissues that support your knee.  Knee pain is usually not a sign of a serious problem. The pain may go away on its own with time and rest. If it does not, a health care provider may order tests to find the cause of the pain. These may include:  Imaging tests, such as an X-ray, MRI, or ultrasound.  Joint aspiration. In this test, fluid is removed from the knee.  Arthroscopy. In this test, a lighted tube is inserted into knee and an image is projected onto a TV screen.  A biopsy. In this test, a sample of tissue is removed from the body and studied under a microscope.  Follow these instructions at home: Pay attention to any changes in your symptoms. Take these actions to relieve your pain. Activity  Rest your knee.  Do not do things that cause pain or make pain worse.  Avoid high-impact activities or exercises, such as running, jumping rope, or doing jumping jacks. General instructions  Take over-the-counter and prescription medicines only as told by your health care provider.  Raise (elevate) your knee above the level of your heart when you are sitting or lying down.  Sleep with a pillow under your knee.  If directed, apply ice to the knee: ? Put ice in a plastic  bag. ? Place a towel between your skin and the bag. ? Leave the ice on for 20 minutes, 2-3 times a day.  Ask your health care provider if you should wear an elastic knee support.  Lose weight if you are overweight. Extra weight can put pressure on your knee.  Do not use any products that contain nicotine or tobacco, such as cigarettes and e-cigarettes. Smoking may slow the healing of any bone and joint problems that you may have. If you need help quitting, ask your health care provider. Contact a health care provider if:  Your knee pain continues, changes, or gets worse.  You have a fever along with knee pain.  Your knee buckles or locks up.  Your knee swells, and the swelling becomes worse. Get help right away if:  Your knee feels warm to the touch.  You cannot move your knee.  You have severe pain in your knee.  You have chest pain.  You have trouble breathing. Summary  Knee pain in adults is common. It can be caused by many things, including, arthritis, infection, cysts, or injury.  Knee pain is usually not a sign of a serious problem, but if it does not go away, a health care provider may perform tests to know the cause of the pain.  Pay attention to any changes in your symptoms. Relieve your pain with rest, medicines, light activity, and use of ice.  Get help if your pain continues or becomes very severe, or if your knee buckles or locks up, or if you have chest pain or trouble breathing. This information is not intended to replace advice given to you by your health care provider. Make sure you discuss any questions you have with your health care provider. Document Released: 12/12/2006 Document Revised: 02/05/2016 Document Reviewed: 02/05/2016 Elsevier Interactive Patient Education  2018 [employer].    IF you received an x-ray today, you will receive an invoice from [facility]. Please contact [facility] at [phone] with questions or concerns  regarding your invoice.   IF you received labwork today, you will receive an invoice from [place]. Please contact [facility] at [phone] with questions or concerns regarding your invoice.   Our billing staff will not be able to assist you with questions regarding bills from these companies.  You will be contacted with the lab results as soon as they are available. The fastest way to get your results is to activate your My Chart account. Instructions are located on the last page of this paperwork. If you have not heard from [place] regarding the results in 2 weeks, please contact this office.

## 2016-11-10 ENCOUNTER — Encounter: Payer: Self-pay | Admitting: Family Medicine

## 2016-11-10 ENCOUNTER — Ambulatory Visit (INDEPENDENT_AMBULATORY_CARE_PROVIDER_SITE_OTHER): Payer: Worker's Compensation | Admitting: Family Medicine

## 2016-11-10 VITALS — BP 130/79 | HR 55 | Temp 97.9°F | Resp 16 | Ht 66.9 in | Wt 223.0 lb

## 2016-11-10 DIAGNOSIS — M25469 Effusion, unspecified knee: Secondary | ICD-10-CM

## 2016-11-10 DIAGNOSIS — M25362 Other instability, left knee: Secondary | ICD-10-CM | POA: Diagnosis not present

## 2016-11-10 DIAGNOSIS — M25562 Pain in left knee: Secondary | ICD-10-CM | POA: Diagnosis not present

## 2016-11-10 MED ORDER — TRAMADOL HCL 50 MG PO TABS: 50.0000 mg | ORAL_TABLET | Freq: Four times a day (QID) | ORAL | 0 refills | Status: DC | PRN

## 2016-11-10 NOTE — Progress Notes (Signed)
Subjective:  By signing my name below, I, Stann Ore, attest that this documentation has been prepared under the direction and in the presence of Meredith Staggers, MD. Electronically Signed: Stann Ore, Scribe. 11/10/2016 , 5:17 PM .  Patient was seen in Room 11 .   Patient ID: [name], male    DOB: [birth year], 61 y.o.   MRN: [id number] Chief Complaint  Patient presents with  . Follow-up    left knee injury   HPI [name] is a 61 y.o. male  Here for follow up on left knee injury that occurred at work. Date of injury was on Sept 7th, 2018. He works for [employer] for [employer].   Patient had slipped on wet grass, causing left knee to twist. Initial increase swelling that improved when I saw him on the 10th, but still have some effusion. Initial xray was negative, other than mild degenerative changes. Placed in hinge knee brace, meloxicam 7.5mg  QD prn, work restrictions; here for recheck.   Patient states his left knee is hurting more, and notes increased his meloxicam to 3-4 times a day. He did feel a little better 2 days ago, but worsened yesterday. He hasn't been able to make it through the day today. When bearing weight, he feels extra tightness in the front part of his knee yesterday and into today. He isn't able to bend his leg when climbing in and out of his truck. He also notes instability and left knee giving way today. His knee feels looser. He's been applying ice the area before bed. He denies any known allergies with pain medications. He denies history of seizures.   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 Pain with climbing in and out of the truck.   Prior to Admission medications   Medication Sig Start Date End Date Taking? Authorizing Provider  carvedilol (COREG) 25 MG tablet Take 0.5 tablets (12.5 mg total) by mouth 2 (two) times daily with a meal. 09/07/15   Purcell Nails, MD  furosemide (LASIX) 40 MG  tablet TAKE ONE TABLET ONLY AS NEEDED FOR WEIGHT GAIN >3-5LBS 07/06/16   Newman Nip, NP  meloxicam (MOBIC) 7.5 MG tablet Take 1 tablet (7.5 mg total) by mouth daily as needed for pain. 11/07/16   Shade Flood, MD   Allergies  Allergen Reactions  . Penicillins Other (See Comments)    ## SYNCOPE ## (Tolerates amoxicillin) 05/28/15-Pt interview, reaction was "fainted after PCN as a child" Has patient had a PCN reaction causing immediate rash, facial/tongue/throat swelling, SOB or lightheadedness with hypotension: Yes Has patient had a PCN reaction causing severe rash involving mucus membranes or skin necrosis: No Has patient had a PCN reaction that required hospitalization No Has patient had a PCN reaction occurring within the last 10 years: No If all of the above answers are "NO  . Zinacef [Cefuroxime] Anaphylaxis    Patient reacted to 1 ml Test dose of Zinacef in OR.  . Cefuroxime Axetil Rash    Experienced redness and reaction in OR   Review of Systems  Constitutional: Negative for fatigue and unexpected weight change.  Eyes: Negative for visual disturbance.  Respiratory: Negative for cough, chest tightness and shortness of breath.   Cardiovascular: Negative for chest pain, palpitations and leg swelling.  Gastrointestinal: Negative for abdominal pain and blood in stool.  Musculoskeletal: Positive for arthralgias, gait problem and myalgias.  Neurological: Negative for  dizziness, light-headedness and headaches.       Objective:   Physical Exam  Constitutional: He is oriented to person, place, and time. He appears well-developed and well-nourished. No distress.  HENT:  Head: Normocephalic and atraumatic.  Eyes: Pupils are equal, round, and reactive to light. EOM are normal.  Neck: Neck supple.  Cardiovascular: Normal rate.   Pulmonary/Chest: Effort normal. No respiratory distress.  Musculoskeletal: Normal range of motion.  Left knee: skin intact, no erythema, has a 1-2+  effusion; full extension, guarded with flexion right at 90 limited by tightness; patellar and patellar tendon non tender, but tender along patellar joint line, and to the proximal tibia medially; femur non tender; guarded with McMurray due to pain medially, guarded and pain with MCL testing- valgus, as well as varus  Neurological: He is alert and oriented to person, place, and time.  Skin: Skin is warm and dry.  Psychiatric: He has a normal mood and affect. His behavior is normal.  Nursing note and vitals reviewed.   Vitals:   11/10/16 1629  BP: 130/79  Pulse: (!) 55  Resp: 16  Temp: 97.9 F (36.6 C)  TempSrc: Oral  SpO2: 97%  Weight: 223 lb (101.2 kg)  Height: 5' 6.9" (1.699 m)      Assessment & Plan:   [name] is a 61 y.o. male Left medial knee pain - Plan: traMADol (ULTRAM) 50 MG tablet, MR Knee Left  Wo Contrast, Ambulatory referral to Orthopedic Surgery, CANCELED: Ambulatory referral to Orthopedic Surgery  Knee swelling - Plan: traMADol (ULTRAM) 50 MG tablet, MR Knee Left  Wo Contrast, Ambulatory referral to Orthopedic Surgery, CANCELED: Ambulatory referral to Orthopedic Surgery  Knee instability, left  Left medial knee pain after injury at work with date of injury 11/04/16.  Initial x-ray without apparent acute findings. Has had progression of pain, some instability, and difficulty with getting in and out of the vehicle, difficulty with prior restrictions.  Suspicious for meniscal tear or intra-articular injury given mechanical symptoms and persistent effusion, worsening pain.  - Refer to orthopedics, also attempted to order MRI   - Meloxicam no more than 15 mg total per day, tramadol prescribed for more severe pain. Potential side effects discussed  -Placed out of work due to above difficulty with restrictions, and potential side effects with new pain medication tramadol. Paperwork completed for work. RTC precautions if worsening or has not been able to follow-up with  orthopedist in a week  Meds ordered this encounter  Medications  . traMADol (ULTRAM) 50 MG tablet    Sig: Take 1-2 tablets (50-100 mg total) by mouth every 6 (six) hours as needed.    Dispense:  30 tablet    Refill:  0   Patient Instructions   I will refer you to orthopedics and try to get the MRI order in as well.  Continue the brace, crutches as needed, meloxicam no more than 1 pill per day, but you can also take tramadol that was prescribed today every 4-6 hours as needed for pain. Due to the worsening pain, need for stronger pain medication, and issues with getting in and out of a vehicle, I did write for you to be out of work until you are seen by orthopedics.  Return to the clinic or go to the nearest emergency room if any of your symptoms worsen or new symptoms occur.    IF you received an x-ray today, you will receive an invoice from [facility]. Please contact [facility]  Radiology at [phone] with questions or concerns regarding your invoice.   IF you received labwork today, you will receive an invoice from [place]. Please contact [facility] at [phone] with questions or concerns regarding your invoice.   Our billing staff will not be able to assist you with questions regarding bills from these companies.  You will be contacted with the lab results as soon as they are available. The fastest way to get your results is to activate your My Chart account. Instructions are located on the last page of this paperwork. If you have not heard from [place] regarding the results in 2 weeks, please contact this office.       I personally performed the services described in this documentation, which was scribed in my presence. The recorded information has been reviewed and considered for accuracy and completeness, addended by me as needed, and agree with information above.  Signed,   Meredith Staggers, MD Primary Care at [facility].  11/12/16 3:44  PM

## 2016-11-10 NOTE — Patient Instructions (Addendum)
I will refer you to orthopedics and try to get the MRI order in as well.  Continue the brace, crutches as needed, meloxicam no more than 1 pill per day, but you can also take tramadol that was prescribed today every 4-6 hours as needed for pain. Due to the worsening pain, need for stronger pain medication, and issues with getting in and out of a vehicle, I did write for you to be out of work until you are seen by orthopedics.  Return to the clinic or go to the nearest emergency room if any of your symptoms worsen or new symptoms occur.    IF you received an x-ray today, you will receive an invoice from [facility]. Please contact [facility] at [phone] with questions or concerns regarding your invoice.   IF you received labwork today, you will receive an invoice from [place]. Please contact [facility] at [phone] with questions or concerns regarding your invoice.   Our billing staff will not be able to assist you with questions regarding bills from these companies.  You will be contacted with the lab results as soon as they are available. The fastest way to get your results is to activate your My Chart account. Instructions are located on the last page of this paperwork. If you have not heard from [place] regarding the results in 2 weeks, please contact this office.

## 2016-11-11 ENCOUNTER — Ambulatory Visit: Payer: Self-pay | Admitting: Family Medicine

## 2016-11-14 ENCOUNTER — Ambulatory Visit: Payer: Self-pay | Admitting: Family Medicine

## 2016-11-15 ENCOUNTER — Telehealth: Payer: Self-pay | Admitting: Family Medicine

## 2016-11-15 NOTE — Telephone Encounter (Signed)
Spoke with [name] W/C adjustor for [place] and he stated pt has appt scheduled and authorized by them at [facility] on 11/17/16 at 8:45 am. He said the MRI was also authorized by them and would be scheduled by pt with [facility]. [facility] contacted [place] requesting the order for the MRI be faxed to them. This has been faxed on 9/18. Thanks.

## 2016-11-24 ENCOUNTER — Ambulatory Visit
Admission: RE | Admit: 2016-11-24 | Discharge: 2016-11-24 | Disposition: A | Discharge status: Home / Self Care | Payer: Worker's Compensation | Source: Ambulatory Visit | Attending: Family Medicine | Admitting: Family Medicine

## 2016-11-24 DIAGNOSIS — M25562 Pain in left knee: Secondary | ICD-10-CM

## 2016-11-24 DIAGNOSIS — M25469 Effusion, unspecified knee: Secondary | ICD-10-CM

## 2016-11-28 ENCOUNTER — Other Ambulatory Visit: Payer: Self-pay | Admitting: Family Medicine

## 2016-11-28 DIAGNOSIS — Z1231 Encounter for screening mammogram for malignant neoplasm of breast: Secondary | ICD-10-CM

## 2016-12-29 ENCOUNTER — Encounter (HOSPITAL_COMMUNITY): Payer: Self-pay | Admitting: Nurse Practitioner

## 2016-12-29 ENCOUNTER — Ambulatory Visit (HOSPITAL_COMMUNITY)
Admission: RE | Admit: 2016-12-29 | Discharge: 2016-12-29 | Disposition: A | Discharge status: Home / Self Care | Payer: Worker's Compensation | Source: Ambulatory Visit | Attending: Nurse Practitioner | Admitting: Nurse Practitioner

## 2016-12-29 VITALS — BP 130/88 | HR 53 | Ht 66.0 in | Wt 219.4 lb

## 2016-12-29 DIAGNOSIS — I429 Cardiomyopathy, unspecified: Secondary | ICD-10-CM | POA: Diagnosis not present

## 2016-12-29 DIAGNOSIS — Z880 Allergy status to penicillin: Secondary | ICD-10-CM | POA: Diagnosis not present

## 2016-12-29 DIAGNOSIS — I481 Persistent atrial fibrillation: Secondary | ICD-10-CM | POA: Insufficient documentation

## 2016-12-29 DIAGNOSIS — E669 Obesity, unspecified: Secondary | ICD-10-CM | POA: Insufficient documentation

## 2016-12-29 DIAGNOSIS — Z01818 Encounter for other preprocedural examination: Secondary | ICD-10-CM | POA: Diagnosis not present

## 2016-12-29 DIAGNOSIS — Z6835 Body mass index (BMI) 35.0-35.9, adult: Secondary | ICD-10-CM | POA: Diagnosis not present

## 2016-12-29 DIAGNOSIS — I110 Hypertensive heart disease with heart failure: Secondary | ICD-10-CM | POA: Insufficient documentation

## 2016-12-29 DIAGNOSIS — I2510 Atherosclerotic heart disease of native coronary artery without angina pectoris: Secondary | ICD-10-CM | POA: Insufficient documentation

## 2016-12-29 DIAGNOSIS — Z79899 Other long term (current) drug therapy: Secondary | ICD-10-CM | POA: Diagnosis not present

## 2016-12-29 DIAGNOSIS — I5032 Chronic diastolic (congestive) heart failure: Secondary | ICD-10-CM | POA: Insufficient documentation

## 2016-12-29 NOTE — Progress Notes (Signed)
Primary Care Physician: Plotnikov, Georgina Quint, MD Referring Physician: Dr. Burna Cash [name] is a 61 y.o. male with a h/o persistent afib and Maze procedure 04/2016, He has been dong very well since Maze surgery without any afib. He has been off anticoagulation for some time. EF has stabilized since July 2017. 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 No active exertional chest pain or shortness of breath. Fluid weight is stable. BP is well managed. He is pending Meniscus surgery with local anesthetic and is here for pre op exam.  Today, he denies symptoms of palpitations, chest pain, shortness of breath, orthopnea, PND, lower extremity edema, dizziness, presyncope, syncope, or neurologic sequela. The patient is tolerating medications without difficulties and is otherwise without complaint today.   Past Medical History:  Diagnosis Date  . ALLERGIC RHINITIS   . Arthritis   . Atrial flutter [facility])    s/p CTI ablation 2006 by Ladona Ridgel  . Atypical atrial flutter (HCC)   . CAD (coronary artery disease) 03/25/15   nonobstructive by cath  . Chronic diastolic (congestive) heart failure (HCC)   . Dysrhythmia    AFIB  . Essential hypertension   . Non-ischemic cardiomyopathy (HCC)    tachycardiac mediated  . Obesity   . Persistent atrial fibrillation (HCC)    a. s/p afib ablation 02/13/10 and redo ablation 10/26/12  . Rheumatic fever    childhood  . S/P Minimally invasive maze operation for atrial fibrillation 05/28/2015   Complete bilateral atrial lesion set using cryothermy and bipolar radiofrequency ablation with oversewing of LA appendage via right mini thoracotomy approach   . Shortness of breath dyspnea    W/ EXERTION   . Sleep apnea    PATIENT STATES HE DOES NOT HAVE   Past Surgical History:  Procedure Laterality Date  . ATRIAL ABLATION SURGERY  2006   CTI ablation  . atrial fibrillation ablation  02/13/10  . ATRIAL FIBRILLATION ABLATION N/A 10/26/2012   Procedure: ATRIAL FIBRILLATION ABLATION;  Surgeon: Hillis Range, MD;  Location: [facility] CATH LAB;  Service: Cardiovascular;  Laterality: N/A;  . CARDIAC CATHETERIZATION N/A 03/25/2015   Procedure: Left Heart Cath and Coronary Angiography;  Surgeon: Lennette Bihari, MD;  Location: [facility] INVASIVE CV LAB;  Service: Cardiovascular;  Laterality: N/A;  . CARDIOVERSION N/A 03/06/2015   Procedure: CARDIOVERSION;  Surgeon: Thurmon Fair, MD;  Location: [facility] ENDOSCOPY;  Service: Cardiovascular;  Laterality: N/A;  . CARDIOVERSION N/A 04/15/2015   Procedure: CARDIOVERSION;  Surgeon: Vesta Mixer, MD;  Location: [facility] ENDOSCOPY;  Service: Cardiovascular;  Laterality: N/A;  . CARDIOVERSION N/A 07/17/2015   Procedure: CARDIOVERSION;  Surgeon: Jake Bathe, MD;  Location: [facility] ENDOSCOPY;  Service: Cardiovascular;  Laterality: N/A;  . JOINT REPLACEMENT Left    toe joint  . MINIMALLY INVASIVE MAZE PROCEDURE N/A 05/28/2015   Procedure: MINIMALLY INVASIVE MAZE PROCEDURE;  Surgeon: Purcell Nails, MD;  Location: [facility] OR;  Service: Open Heart Surgery;  Laterality: N/A;  . TEE WITHOUT CARDIOVERSION N/A 10/25/2012   Procedure: TRANSESOPHAGEAL ECHOCARDIOGRAM (TEE);  Surgeon: Lewayne Bunting, MD;  Location: [facility] ENDOSCOPY;  Service: Cardiovascular;  Laterality: N/A;  . TEE WITHOUT CARDIOVERSION N/A 03/06/2015   Procedure: TRANSESOPHAGEAL ECHOCARDIOGRAM (TEE);  Surgeon: Thurmon Fair, MD;  Location: [facility] ENDOSCOPY;  Service: Cardiovascular;  Laterality: N/A;  . TEE WITHOUT CARDIOVERSION N/A 05/28/2015   Procedure: TRANSESOPHAGEAL ECHOCARDIOGRAM (TEE);  Surgeon: Purcell Nails, MD;  Location: [facility] OR;  Service: Open Heart Surgery;  Laterality: N/A;  .  TONSILLECTOMY      Current Outpatient Prescriptions  Medication Sig Dispense Refill  . carvedilol (COREG) 25 MG tablet Take 0.5 tablets (12.5 mg total) by mouth 2 (two) times daily with a meal. 180 tablet 3  . furosemide (LASIX) 40 MG tablet TAKE ONE TABLET ONLY AS NEEDED FOR WEIGHT GAIN >3-5LBS  30 tablet 1  . meloxicam (MOBIC) 7.5 MG tablet Take 1 tablet (7.5 mg total) by mouth daily as needed for pain. (Patient not taking: Reported on 12/29/2016) 30 tablet 0  . traMADol (ULTRAM) 50 MG tablet Take 1-2 tablets (50-100 mg total) by mouth every 6 (six) hours as needed. (Patient not taking: Reported on 12/29/2016) 30 tablet 0   No current facility-administered medications for this encounter.     Allergies  Allergen Reactions  . Penicillins Other (See Comments)    ## SYNCOPE ## (Tolerates amoxicillin) 05/28/15-Pt interview, reaction was "fainted after PCN as a child" Has patient had a PCN reaction causing immediate rash, facial/tongue/throat swelling, SOB or lightheadedness with hypotension: Yes Has patient had a PCN reaction causing severe rash involving mucus membranes or skin necrosis: No Has patient had a PCN reaction that required hospitalization No Has patient had a PCN reaction occurring within the last 10 years: No If all of the above answers are "NO  . Zinacef [Cefuroxime] Anaphylaxis    Patient reacted to 1 ml Test dose of Zinacef in OR.  . Cefuroxime Axetil Rash    Experienced redness and reaction in OR    Social History   Social History  . Marital status: Married    Spouse name: N/A  . Number of children: 1  . Years of education: N/A   Occupational History  . 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 Smoking status: Never Smoker  . Smokeless tobacco: Never Used  . Alcohol use No  . Drug use: No  . Sexual activity: Not on file   Other Topics Concern  . Not on file   Social History Narrative   He is married and lives with his wife in [place].     Family History  Problem Relation Age of Onset  . Lung cancer Father        smoker  . Lung cancer Mother        smoker  . Diabetes Neg Hx   . Prostate cancer Neg Hx   . Colon cancer Neg Hx     ROS- All systems are reviewed and negative except as per the HPI above  Physical Exam: Vitals:    12/29/16 1409  BP: 130/88  Pulse: (!) 53  Weight: 219 lb 6.4 oz (99.5 kg)  Height: 5\' 6"  (1.676 m)   Wt Readings from Last 3 Encounters:  12/29/16 219 lb 6.4 oz (99.5 kg)  11/10/16 223 lb (101.2 kg)  11/07/16 217 lb (98.4 kg)    Labs: Lab Results  Component Value Date   NA 140 07/13/2015   K 4.1 07/13/2015   CL 103 07/13/2015   CO2 28 07/13/2015   GLUCOSE 110 (H) 07/13/2015   BUN 14 07/13/2015   CREATININE 1.31 (H) 07/13/2015   CALCIUM 9.4 07/13/2015   MG 2.0 06/02/2015   Lab Results  Component Value Date   INR 1.37 05/28/2015   Lab Results  Component Value Date   CHOL 270 (H) 05/17/2013   CHOL 269 (H) 05/17/2013   HDL 34.80 (L) 05/17/2013   HDL 35.50 (L) 05/17/2013   LDLCALC 198 (H)  05/17/2013   LDLCALC 198 (H) 05/17/2013   TRIG 185.0 (H) 05/17/2013   TRIG 180.0 (H) 05/17/2013     GEN- The patient is well appearing, alert and oriented x 3 today.   Head- normocephalic, atraumatic Eyes-  Sclera clear, conjunctiva pink Ears- hearing intact Oropharynx- clear Neck- supple, no JVP Lymph- no cervical lymphadenopathy Lungs- Clear to ausculation bilaterally, normal work of breathing Heart- Regular rate and rhythm, no murmurs, rubs or gallops, PMI not laterally displaced GI- soft, NT, ND, + BS Extremities- no clubbing, cyanosis, or edema MS- no significant deformity or atrophy Skin- no rash or lesion Psych- euthymic mood, full affect Neuro- strength and sensation are intact  EKG-Sinus brady at 53 bpm, pr int 178 ms, qrs int 98 ms, qtc 392 ms Echo-7/18- Study Conclusions  - Left ventricle: The cavity size was normal. Wall thickness was   increased in a pattern of mild LVH. Systolic function was normal.   The estimated ejection fraction was in the range of 60% to 65%.   The study is not technically sufficient to allow evaluation of LV   diastolic function. - Aortic valve: Trileaflet. Sclerosis without stenosis. There was   trivial regurgitation. - Left  atrium: The atrium was normal in size. - Right ventricle: The cavity size was mildly dilated. Systolic   function was normal. - Right atrium: The atrium was mildly dilated. - Inferior vena cava: The vessel was normal in size. The   respirophasic diameter changes were in the normal range (= 50%),   consistent with normal central venous pressure.  Impressions:  - Compared to a prior study in 04/2015, the LVEF has normalized.   Assessment and Plan: 1. Pending meniscus surgery Pt is at low risk for a low risk procedure and may proceed to to have knee surgery He does not have any outstanding cardiac issues or symptoms at this time  2.HTN Stable  3. H/o Diastolic HF Stabilized since Maze procedure and staying in [employer] [facility] [address] [place], [place] [address] [phone]

## 2017-05-24 ENCOUNTER — Other Ambulatory Visit (HOSPITAL_COMMUNITY): Payer: Self-pay | Admitting: *Deleted

## 2017-05-24 MED ORDER — CARVEDILOL 12.5 MG PO TABS: 12.5000 mg | ORAL_TABLET | Freq: Two times a day (BID) | ORAL | 3 refills | Status: DC

## 2018-05-20 ENCOUNTER — Other Ambulatory Visit (HOSPITAL_COMMUNITY): Payer: Self-pay | Admitting: Nurse Practitioner

## 2019-05-29 ENCOUNTER — Other Ambulatory Visit (HOSPITAL_COMMUNITY): Payer: Self-pay | Admitting: Nurse Practitioner

## 2019-06-25 ENCOUNTER — Other Ambulatory Visit (HOSPITAL_COMMUNITY): Payer: Self-pay | Admitting: Nurse Practitioner

## 2020-12-25 ENCOUNTER — Telehealth (HOSPITAL_COMMUNITY): Payer: Self-pay

## 2020-12-25 NOTE — Telephone Encounter (Signed)
Patient called about scheduling follow up appointment to be seen at the [facility]. He had a Maze procedure performed on 05/28/2015 by Dr. Cornelius Moras. Patient states he is not having A-fib issues just would like to have his heart checked. Instructed patient to contact Dr. Jenel Lucks clinic to schedule a follow up appointment. Consulted with patient and he verbalized understanding.

## 2021-01-13 ENCOUNTER — Ambulatory Visit (HOSPITAL_COMMUNITY): Payer: Managed Care, Other (non HMO) | Admitting: Nurse Practitioner

## 2022-11-02 ENCOUNTER — Encounter (HOSPITAL_COMMUNITY): Payer: Self-pay

## 2022-11-02 DIAGNOSIS — S8011XA Contusion of right lower leg, initial encounter: Secondary | ICD-10-CM | POA: Insufficient documentation

## 2022-11-02 DIAGNOSIS — I4891 Unspecified atrial fibrillation: Secondary | ICD-10-CM | POA: Insufficient documentation

## 2022-11-02 DIAGNOSIS — I110 Hypertensive heart disease with heart failure: Secondary | ICD-10-CM | POA: Insufficient documentation

## 2022-11-02 DIAGNOSIS — Z7982 Long term (current) use of aspirin: Secondary | ICD-10-CM | POA: Insufficient documentation

## 2022-11-02 DIAGNOSIS — I509 Heart failure, unspecified: Secondary | ICD-10-CM | POA: Insufficient documentation

## 2022-11-02 DIAGNOSIS — M79661 Pain in right lower leg: Secondary | ICD-10-CM

## 2022-11-02 DIAGNOSIS — W11XXXA Fall on and from ladder, initial encounter: Secondary | ICD-10-CM | POA: Insufficient documentation

## 2022-11-02 DIAGNOSIS — I2510 Atherosclerotic heart disease of native coronary artery without angina pectoris: Secondary | ICD-10-CM | POA: Insufficient documentation

## 2022-11-02 DIAGNOSIS — Z9229 Personal history of other drug therapy: Secondary | ICD-10-CM | POA: Insufficient documentation

## 2022-11-02 NOTE — ED Notes (Signed)
Pt is a&ox4, pwd. Pt complains of pain 10/10 to right lower leg. Ice pack applied to site. Foot and lower leg are bruised and swollen. Pt has no other complaints.

## 2022-11-02 NOTE — ED Notes (Signed)
Ice pack provided for right shin.

## 2022-11-02 NOTE — ED Triage Notes (Signed)
Pt came in via POV d/t falling off ladder & bumping his Rt shin into the bottom step, this happened 5 days ago. Pt also reports that he takes aspirin daily & is also concerned for that he was driving for long distance recently that he could have a blood clot as well (per pt) since he does feel heat in the area. The bottom of His Rt foot is beginning to look bruised as well. Rates pain 108/10 while in triage.

## 2022-11-02 NOTE — Discharge Instructions (Addendum)
Thank you for allowing [place] to be a part of your care today.  You were evaluated in the ED for swelling and pain to your right lower leg.  Your workup today is reassuring and your ultrasound did not show evidence of a blood clot in your leg.  Your physical exam findings are consistent with a hematoma.  This is a large collection of blood under the skin, likely due to your blood thinner use.  These will heal over time.  I do recommend continuing to ice your leg 3-4 times per day for no longer than 20 minutes at a time to help with swelling.  You may also wrap the leg with an Ace bandage or use compression sock to help with swelling.  If this causes an increase in your pain, discontinue use.  I have sent a short course of pain medicine to the pharmacy for you to use.  Avoid taking any NSAIDs including Advil or Aleve as you cannot combine these medications with your blood thinner.  If you have mild to moderate pain I recommend taking 501,000 mg of Tylenol every 6 hours.  Do not exceed 4000 mg of Tylenol in a 24-hour period from all sources.  Return to the ED if you develop sudden worsening of your symptoms or if you have any new concerns.

## 2022-11-03 NOTE — Telephone Encounter (Signed)
Pt and [facility] Pharmacist called regarding recent visit.  RNCM notified Registration Supervisor to investigate.

## 2023-02-12 ENCOUNTER — Emergency Department (HOSPITAL_COMMUNITY)
Admission: EM | Admit: 2023-02-12 | Discharge: 2023-02-12 | Disposition: A | Discharge status: Home / Self Care | Payer: Managed Care, Other (non HMO)

## 2023-02-12 ENCOUNTER — Encounter (HOSPITAL_COMMUNITY): Payer: Self-pay

## 2023-02-12 ENCOUNTER — Other Ambulatory Visit: Payer: Self-pay

## 2023-02-12 DIAGNOSIS — N4889 Other specified disorders of penis: Secondary | ICD-10-CM | POA: Diagnosis present

## 2023-02-12 DIAGNOSIS — I83899 Varicose veins of unspecified lower extremities with other complications: Secondary | ICD-10-CM

## 2023-02-12 LAB — CBC WITH DIFFERENTIAL/PLATELET
Abs Immature Granulocytes: 0.03 10*3/uL (ref 0.00–0.07)
Basophils Absolute: 0.1 10*3/uL (ref 0.0–0.1)
Basophils Relative: 1 %
Eosinophils Absolute: 0.2 10*3/uL (ref 0.0–0.5)
Eosinophils Relative: 3 %
HCT: 47.8 % (ref 39.0–52.0)
Hemoglobin: 16.1 g/dL (ref 13.0–17.0)
Immature Granulocytes: 1 %
Lymphocytes Relative: 27 %
Lymphs Abs: 1.8 10*3/uL (ref 0.7–4.0)
MCH: 31 pg (ref 26.0–34.0)
MCHC: 33.7 g/dL (ref 30.0–36.0)
MCV: 91.9 fL (ref 80.0–100.0)
Monocytes Absolute: 0.6 10*3/uL (ref 0.1–1.0)
Monocytes Relative: 10 %
Neutro Abs: 3.9 10*3/uL (ref 1.7–7.7)
Neutrophils Relative %: 58 %
Platelets: 192 10*3/uL (ref 150–400)
RBC: 5.2 MIL/uL (ref 4.22–5.81)
RDW: 13.6 % (ref 11.5–15.5)
WBC: 6.6 10*3/uL (ref 4.0–10.5)
nRBC: 0 % (ref 0.0–0.2)

## 2023-02-12 NOTE — ED Provider Notes (Addendum)
[facility] EMERGENCY DEPARTMENT AT [facility] Provider Note   CSN: [id number] Arrival date & time: 02/12/23  1734     History  No chief complaint on file.   [name] is a 67 y.o. male.  67 year old male presents here for concerns of spontaneous bleeding from a vein on the lateral shaft of his penis.  This started immediately after patient was drying himself off after a shower.  He denies any other trauma to the area.  Denies associated pain.  Patient was applying gauze for 10 minutes at a time.  This would stop the bleeding for a while.  However, bleeding would restart when he removes the gauze to check on the wound.  Patient was able to get the bleeding stopped once he arrived here by holding a washcloth on the area.  He is not currently on anticoagulation.  Denies any urinary symptoms such as difficulty urinating, pain with urination.  Denies lightheadedness, dizziness, chest pain, shortness of breath.  The history is provided by the patient and the spouse.       Home Medications Prior to Admission medications   Medication Sig Start Date End Date Taking? Authorizing Provider  carvedilol (COREG) 12.5 MG tablet Take 1 tablet (12.5 mg total) by mouth 2 (two) times daily with a meal. Appointment Required For Further Refills [phone] 06/25/19   Newman Nip, NP  furosemide (LASIX) 40 MG tablet TAKE ONE TABLET ONLY AS NEEDED FOR WEIGHT GAIN >3-5LBS 07/06/16   Newman Nip, NP  meloxicam (MOBIC) 7.5 MG tablet Take 1 tablet (7.5 mg total) by mouth daily as needed for pain. Patient not taking: Reported on 12/29/2016 11/07/16   Shade Flood, MD      Allergies    Penicillins, Zinacef [cefuroxime], and Cefuroxime axetil    Review of Systems   As noted in HPI  Physical Exam Updated Vital Signs BP (!) 176/84 (BP Location: Right Arm)   Pulse 75   Temp 98.4 F (36.9 C)   Resp 18   Ht 5\' 6"  (1.676 m)   Wt 99.5 kg   SpO2 97%   BMI 35.41 kg/m  Physical  Exam Vitals reviewed. Exam conducted with a chaperone present.  Constitutional:      General: He is not in acute distress.    Appearance: He is obese. He is not ill-appearing, toxic-appearing or diaphoretic.  Cardiovascular:     Rate and Rhythm: Normal rate and regular rhythm.     Heart sounds: Normal heart sounds. No murmur heard.    No friction rub. No gallop.  Pulmonary:     Effort: No respiratory distress.     Breath sounds: Normal breath sounds. No wheezing, rhonchi or rales.  Abdominal:     General: There is no distension.     Palpations: Abdomen is soft.     Tenderness: There is no abdominal tenderness. There is no guarding or rebound.  Genitourinary:    Penis: Lesions (Punctate area of hematoma on the left, lateral portion of the shaft of the penis.  No active bleeding.  This overlies a vein.) present. No erythema, tenderness, discharge or swelling.   Skin:    General: Skin is warm and dry.  Neurological:     Mental Status: He is alert.     ED Results / Procedures / Treatments   Labs (all labs ordered are listed, but only abnormal results are displayed) Labs Reviewed  CBC WITH DIFFERENTIAL/PLATELET    EKG None  Radiology No results found.  Procedures Procedures    Medications Ordered in ED Medications - No data to display  ED Course/ Medical Decision Making/ A&P                                 Medical Decision Making Amount and/or Complexity of Data Reviewed Independent Historian: spouse Labs: ordered.   67 year old male presents here for bleeding from a vein on the shaft of the penis.  Vitals reassuring on presentation.  On exam, patient is in no acute distress.  Patient does have multiple prominent veins on the shaft of the penis.  No active bleeding noted.  There is a small area of hematoma at the site where the patient was reporting the bleeding was previously occurring.  Area is now hemostatic in nature.  There is no surrounding erythema or evidence  of trauma.  Area is nontender.  Patient is not having any other associated symptoms.  CBC was obtained and is at his baseline.  No evidence of acute blood loss anemia.  Given that the area is now hemostatic, no additional interventions indicated.  Recommend follow-up with urology to mitigate any potential repeat episodes.  Ambulatory referral to urology placed.  Patient was educated to avoid any trauma to that region for the next 1 week, including tight fitting undergarments or intercourse.  He is felt to be appropriate for discharge at this time.  Discharged in stable condition.  Patient's presentation is most consistent with acute, uncomplicated illness.         Final Clinical Impression(s) / ED Diagnoses Final diagnoses:  None    Rx / DC Orders ED Discharge Orders     None         Rolla Flatten, MD 02/12/23 Wonda Cerise, MD 02/12/23 Bennie Hind    Coral Spikes, DO 02/12/23 2326

## 2023-02-12 NOTE — Discharge Instructions (Signed)
The area that was bleeding will be very sensitive for the next several days to 1 week.  Try to avoid any trauma or friction to that area.  You should pat dry after bathing.  Avoid intercourse for at least the next 1 week.  Wear loosefitting undergarments.  If that area bleeds again, apply pressure for at least 30 minutes.  Do not remove the gauze that you are using to apply pressure as this removes the blood clot.  Please return to the emergency department if you have a recurrence of rebleeding that you are not able to get stopped at home.

## 2023-02-12 NOTE — ED Triage Notes (Signed)
Pt states he was taking a shower and the vein ruptured and started bleeding on left side of penis. 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 Bleeding is controlled.

## 2023-04-19 ENCOUNTER — Encounter (HOSPITAL_COMMUNITY): Payer: Self-pay

## 2023-04-19 ENCOUNTER — Emergency Department (HOSPITAL_COMMUNITY): Payer: Managed Care, Other (non HMO)

## 2023-04-19 ENCOUNTER — Other Ambulatory Visit: Payer: Self-pay

## 2023-04-19 ENCOUNTER — Observation Stay (HOSPITAL_COMMUNITY)
Admission: EM | Admit: 2023-04-19 | Discharge: 2023-04-21 | Disposition: A | Discharge status: Home / Self Care | Payer: Managed Care, Other (non HMO) | Attending: Cardiology | Admitting: Cardiology

## 2023-04-19 DIAGNOSIS — Z79899 Other long term (current) drug therapy: Secondary | ICD-10-CM | POA: Insufficient documentation

## 2023-04-19 DIAGNOSIS — I4819 Other persistent atrial fibrillation: Secondary | ICD-10-CM | POA: Insufficient documentation

## 2023-04-19 DIAGNOSIS — I4891 Unspecified atrial fibrillation: Principal | ICD-10-CM | POA: Insufficient documentation

## 2023-04-19 DIAGNOSIS — I484 Atypical atrial flutter: Principal | ICD-10-CM | POA: Insufficient documentation

## 2023-04-19 DIAGNOSIS — R002 Palpitations: Secondary | ICD-10-CM | POA: Diagnosis present

## 2023-04-19 DIAGNOSIS — E785 Hyperlipidemia, unspecified: Secondary | ICD-10-CM | POA: Insufficient documentation

## 2023-04-19 DIAGNOSIS — I2510 Atherosclerotic heart disease of native coronary artery without angina pectoris: Secondary | ICD-10-CM | POA: Diagnosis not present

## 2023-04-19 DIAGNOSIS — I4892 Unspecified atrial flutter: Secondary | ICD-10-CM | POA: Diagnosis not present

## 2023-04-19 DIAGNOSIS — R7989 Other specified abnormal findings of blood chemistry: Secondary | ICD-10-CM | POA: Diagnosis not present

## 2023-04-19 DIAGNOSIS — R5383 Other fatigue: Secondary | ICD-10-CM | POA: Diagnosis not present

## 2023-04-19 DIAGNOSIS — I110 Hypertensive heart disease with heart failure: Secondary | ICD-10-CM | POA: Insufficient documentation

## 2023-04-19 DIAGNOSIS — Z8679 Personal history of other diseases of the circulatory system: Secondary | ICD-10-CM

## 2023-04-19 DIAGNOSIS — I5040 Unspecified combined systolic (congestive) and diastolic (congestive) heart failure: Secondary | ICD-10-CM | POA: Insufficient documentation

## 2023-04-19 LAB — BASIC METABOLIC PANEL
Anion gap: 7 (ref 5–15)
BUN: 21 mg/dL (ref 8–23)
CO2: 25 mmol/L (ref 22–32)
Calcium: 8.9 mg/dL (ref 8.9–10.3)
Chloride: 106 mmol/L (ref 98–111)
Creatinine, Ser: 1.1 mg/dL (ref 0.61–1.24)
GFR, Estimated: >60 mL/min (ref >60)
Glucose, Bld: 105 mg/dL — ABNORMAL HIGH (ref 70–99)
Potassium: 4.1 mmol/L (ref 3.5–5.1)
Sodium: 138 mmol/L (ref 135–145)

## 2023-04-19 LAB — MAGNESIUM: Magnesium: 2 mg/dL (ref 1.7–2.4)

## 2023-04-19 LAB — TROPONIN I (HIGH SENSITIVITY)
Troponin I (High Sensitivity): 360 ng/L (ref <18)
Troponin I (High Sensitivity): 411 ng/L (ref <18)

## 2023-04-19 LAB — CBC
HCT: 49.2 % (ref 39.0–52.0)
Hemoglobin: 16.5 g/dL (ref 13.0–17.0)
MCH: 30.6 pg (ref 26.0–34.0)
MCHC: 33.5 g/dL (ref 30.0–36.0)
MCV: 91.3 fL (ref 80.0–100.0)
Platelets: 216 10*3/uL (ref 150–400)
RBC: 5.39 MIL/uL (ref 4.22–5.81)
RDW: 13.8 % (ref 11.5–15.5)
WBC: 8.4 10*3/uL (ref 4.0–10.5)
nRBC: 0 % (ref 0.0–0.2)

## 2023-04-19 LAB — HEPATIC FUNCTION PANEL
ALT: 29 U/L (ref 0–44)
AST: 30 U/L (ref 15–41)
Albumin: 3.6 g/dL (ref 3.5–5.0)
Alkaline Phosphatase: 48 U/L (ref 38–126)
Bilirubin, Direct: 0.2 mg/dL (ref 0.0–0.2)
Indirect Bilirubin: 0.6 mg/dL (ref 0.3–0.9)
Total Bilirubin: 0.8 mg/dL (ref 0.0–1.2)
Total Protein: 6.5 g/dL (ref 6.5–8.1)

## 2023-04-19 LAB — TSH: TSH: 4.51 u[IU]/mL — ABNORMAL HIGH (ref 0.350–4.500)

## 2023-04-19 LAB — BRAIN NATRIURETIC PEPTIDE: B Natriuretic Peptide: 235.2 pg/mL — ABNORMAL HIGH (ref 0.0–100.0)

## 2023-04-19 MED ORDER — DILTIAZEM HCL-DEXTROSE 125-5 MG/125ML-% IV SOLN (PREMIX)
5.0000 mg/h | INTRAVENOUS | Status: DC
  Administered 2023-04-19 – 2023-04-20 (×2): 5 mg/h via INTRAVENOUS
  Filled 2023-04-19 (×4): qty 125

## 2023-04-19 MED ORDER — IOHEXOL 350 MG/ML SOLN
75.0000 mL | Freq: Once | INTRAVENOUS | Status: AC | PRN
  Administered 2023-04-19: 75 mL via INTRAVENOUS

## 2023-04-19 MED ORDER — HEPARIN BOLUS VIA INFUSION
4500.0000 [IU] | Freq: Once | INTRAVENOUS | Status: AC
  Administered 2023-04-19: 4500 [IU] via INTRAVENOUS
  Filled 2023-04-19: qty 4500

## 2023-04-19 MED ORDER — CARVEDILOL 12.5 MG PO TABS: 12.5000 mg | ORAL_TABLET | Freq: Two times a day (BID) | ORAL | Status: DC

## 2023-04-19 MED ORDER — METOPROLOL SUCCINATE ER 25 MG PO TB24
25.0000 mg | ORAL_TABLET | Freq: Two times a day (BID) | ORAL | Status: DC
  Administered 2023-04-19: 25 mg via ORAL
  Filled 2023-04-19: qty 1

## 2023-04-19 MED ORDER — HEPARIN (PORCINE) 25000 UT/250ML-% IV SOLN
1550.0000 [IU]/h | INTRAVENOUS | Status: DC
  Administered 2023-04-19: 1400 [IU]/h via INTRAVENOUS
  Filled 2023-04-19: qty 250

## 2023-04-19 MED ORDER — DILTIAZEM LOAD VIA INFUSION
20.0000 mg | Freq: Once | INTRAVENOUS | Status: AC
  Administered 2023-04-19: 20 mg via INTRAVENOUS
  Filled 2023-04-19: qty 20

## 2023-04-19 MED ORDER — SODIUM CHLORIDE 0.9 % IV BOLUS
1000.0000 mL | Freq: Once | INTRAVENOUS | Status: AC
  Administered 2023-04-19: 1000 mL via INTRAVENOUS

## 2023-04-19 NOTE — H&P (Signed)
Cardiology Admission History and Physical   Patient ID: [name] MRN: [id number]; DOB: 16-Jun-1955   Admission date: 04/19/2023  PCP:  Oneita Hurt, No   [facility] Providers Cardiologist:  None        Chief Complaint:  Aflutter/Palpitations  Patient Profile:   [name] is a 68 y.o. 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  History of Present Illness:   [name] is a 69 y.o. 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  Pt has been off anticoagulation and no recurrence of Afib since 2017.  Last seen Cardiology 2018. Pt started feeling fatigue and probably felt like he went into afib last evening when he went for [facility]. Today, was able to do ADLS  and work but still was feeling weak thus came to the hospital. Denies any chest pain, CHF symptoms. Work up so far-EKG shows atrial flutter, variable block, poor R wave progression, LAD,  HR in 130s. Started on IV diltiazem gtt. CXR- cardiomegaly Trop 360->411  He is very active at baseline, still works- Furniture conservator/restorer. Walks 5 miles/day- denies any chest pain, SOB, CHF symptoms. Does report family h/o heart problems, diabetes,no premature CAD or SCD Denies smoking, etoh or drug use OSA- reported mild, does not use CAPAP  Currently on Diltiazem gtt 5mg /hr, stable   Prior Cardiac work up: ECHO 2017 Study Conclusions   - Left ventricle: The cavity size was normal. Wall thickness was    increased in a pattern of mild LVH. Systolic function was normal.    The estimated ejection fraction was in the range of  60% to 65%.    The study is not technically sufficient to allow evaluation of LV    diastolic function.  - Aortic valve: Trileaflet. Sclerosis without stenosis. There was    trivial regurgitation.  - Left atrium: The atrium was normal in size.  - Right ventricle: The cavity size was mildly dilated. Systolic    function was normal.  - Right atrium: The atrium was mildly dilated.  - Inferior vena cava: The vessel was normal in size. The    respirophasic diameter changes were in the normal range (= 50%),    consistent with normal central venous pressure.   CATH: 2017 Conclusion  Mid LAD lesion, 30% stenosed. Prox LAD lesion, 30% stenosed. Ost LAD to Prox LAD lesion, 20% stenosed. There is moderate to severe left ventricular systolic dysfunction.   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   Nonobstructive CAD with 20 and 30% smooth narrowings in the proximal and mid LAD and otherwise normal coronary arteries in a dominant left circumflex system.   RECOMMENDATION: The patient will resume his Pradaxa tomorrow following today's catheterization.  As per Dr. Johney Frame, he will be seeing Dr. Barry Dienes for consideration of Maze procedure.  Past Medical History:  Diagnosis Date   ALLERGIC RHINITIS    Arthritis    Atrial flutter [facility])    s/p CTI ablation 2006 by Ladona Ridgel   Atypical atrial flutter (HCC)    CAD (coronary artery disease) 03/25/15   nonobstructive by cath   Chronic diastolic (congestive)  heart failure (HCC)    Dysrhythmia    AFIB   Essential hypertension    Non-ischemic cardiomyopathy (HCC)    tachycardiac mediated   Obesity    Persistent atrial fibrillation (HCC)    a. s/p afib ablation 02/13/10 and redo ablation 10/26/12   Rheumatic fever    childhood   S/P Minimally invasive maze operation for atrial fibrillation 05/28/2015   Complete bilateral atrial lesion set using cryothermy and bipolar radiofrequency ablation with oversewing of LA appendage  via right mini thoracotomy approach    Shortness of breath dyspnea    W/ EXERTION    Sleep apnea    PATIENT STATES HE DOES NOT HAVE    Past Surgical History:  Procedure Laterality Date   ATRIAL ABLATION SURGERY  2006   CTI ablation   atrial fibrillation ablation  02/13/10   ATRIAL FIBRILLATION ABLATION N/A 10/26/2012   Procedure: ATRIAL FIBRILLATION ABLATION;  Surgeon: Hillis Range, MD;  Location: [facility] CATH LAB;  Service: Cardiovascular;  Laterality: N/A;   CARDIAC CATHETERIZATION N/A 03/25/2015   Procedure: Left Heart Cath and Coronary Angiography;  Surgeon: Lennette Bihari, MD;  Location: [facility] INVASIVE CV LAB;  Service: Cardiovascular;  Laterality: N/A;   CARDIOVERSION N/A 03/06/2015   Procedure: CARDIOVERSION;  Surgeon: Thurmon Fair, MD;  Location: [facility] ENDOSCOPY;  Service: Cardiovascular;  Laterality: N/A;   CARDIOVERSION N/A 04/15/2015   Procedure: CARDIOVERSION;  Surgeon: Vesta Mixer, MD;  Location: [facility] ENDOSCOPY;  Service: Cardiovascular;  Laterality: N/A;   CARDIOVERSION N/A 07/17/2015   Procedure: CARDIOVERSION;  Surgeon: Jake Bathe, MD;  Location: [facility] ENDOSCOPY;  Service: Cardiovascular;  Laterality: N/A;   JOINT REPLACEMENT Left    toe joint   MINIMALLY INVASIVE MAZE PROCEDURE N/A 05/28/2015   Procedure: MINIMALLY INVASIVE MAZE PROCEDURE;  Surgeon: Purcell Nails, MD;  Location: [facility] OR;  Service: Open Heart Surgery;  Laterality: N/A;   TEE WITHOUT CARDIOVERSION N/A 10/25/2012   Procedure: TRANSESOPHAGEAL ECHOCARDIOGRAM (TEE);  Surgeon: Lewayne Bunting, MD;  Location: [facility] ENDOSCOPY;  Service: Cardiovascular;  Laterality: N/A;   TEE WITHOUT CARDIOVERSION N/A 03/06/2015   Procedure: TRANSESOPHAGEAL ECHOCARDIOGRAM (TEE);  Surgeon: Thurmon Fair, MD;  Location: [facility] ENDOSCOPY;  Service: Cardiovascular;  Laterality: N/A;   TEE WITHOUT CARDIOVERSION N/A 05/28/2015   Procedure: TRANSESOPHAGEAL ECHOCARDIOGRAM (TEE);  Surgeon: Purcell Nails, MD;  Location: [facility] OR;  Service: Open Heart Surgery;   Laterality: N/A;   TONSILLECTOMY       Medications Prior to Admission: Prior to Admission medications   Medication Sig Start Date End Date Taking? Authorizing Provider  oxyCODONE-acetaminophen (PERCOCET/ROXICET) 5-325 MG tablet Take 1 tablet by mouth every 6 (six) hours as needed. 11/02/22   [provider]  carvedilol (COREG) 12.5 MG tablet Take 1 tablet (12.5 mg total) by mouth 2 (two) times daily with a meal. Appointment Required For Further Refills [phone] 06/25/19   Newman Nip, NP     Allergies:    Allergies  Allergen Reactions   Penicillins Other (See Comments)    ## SYNCOPE ## (Tolerates amoxicillin) 05/28/15-Pt interview, reaction was "fainted after PCN as a child" Has patient had a PCN reaction causing immediate rash, facial/tongue/throat swelling, SOB or lightheadedness with hypotension: Yes Has patient had a PCN reaction causing severe rash involving mucus membranes or skin necrosis: No Has patient had a PCN reaction that required hospitalization No Has patient had a PCN reaction occurring within the last 10 years: No If all of the above answers are "NO  Zinacef [Cefuroxime] Anaphylaxis    Patient reacted to 1 ml Test dose of Zinacef in OR.   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    Social Drivers of Corporate investment banker Strain: Not on file  Food Insecurity:  Not on file  Transportation Needs: Not on file  Physical Activity: Not on file  Stress: Not on file  Social Connections: Not on file  Intimate Partner Violence: Not on file    Family History:   The patient's family history includes Lung cancer in his father and mother. There is no history of Diabetes, Prostate cancer, or Colon cancer.    ROS:  Please see the history of present illness.  All other ROS reviewed and negative.     Physical Exam/Data:   Vitals:   04/19/23 1645 04/19/23 1745 04/19/23 1848 04/19/23 1915  BP:    (!) 133/97  Pulse: (!) 121   (!) 103  Resp:  17  16  Temp:   98.1 F (36.7 C)   TempSrc:   Oral   SpO2:    99%  Weight:      Height:       No intake or output data in the 24 hours ending 04/19/23 2203    04/19/2023    2:38 PM 02/12/2023    5:50 PM 12/29/2016    2:09 PM  Last 3 Weights  Weight (lbs) 225 lb 219 lb 5.7 oz 219 lb 6.4 oz  Weight (kg) 102.059 kg 99.5 kg 99.519 kg     Body mass index is 35.24 kg/m.  General:  Well nourished, well developed, in no acute distress HEENT: normal Neck: no JVD Vascular: No carotid bruits; Distal pulses 2+ bilaterally   Cardiac:  normal S1, S2; Regularly irregular Lungs:  clear to auscultation bilaterally, no wheezing, rhonchi or rales  Abd: soft, nontender, no hepatomegaly  Ext: no edema Musculoskeletal:  No deformities, BUE and BLE strength normal and equal Skin: warm and dry  Neuro:  CNs 2-12 intact, no focal abnormalities noted Psych:  Normal affect    EKG:  The ECG that was done  was personally reviewed and demonstrates aflutte  Relevant CV Studies: As above  Laboratory Data:  High Sensitivity Troponin:   Recent Labs  Lab 04/19/23 1527 04/19/23 1806  TROPONINIHS 360* 411*      Chemistry Recent Labs  Lab 04/19/23 1439  NA 138  K 4.1  CL 106  CO2 25  GLUCOSE 105*  BUN 21  CREATININE 1.10  CALCIUM 8.9  MG 2.0  GFRNONAA >60  ANIONGAP 7    Recent Labs  Lab 04/19/23 1439  PROT 6.5   ALBUMIN 3.6  AST 30  ALT 29  ALKPHOS 48  BILITOT 0.8  Lipids No results for input(s): "CHOL", "TRIG", "HDL", "LABVLDL", "LDLCALC", "CHOLHDL" in the last 168 hours. Hematology Recent Labs  Lab 04/19/23 1439  WBC 8.4  RBC 5.39  HGB 16.5  HCT 49.2  MCV 91.3  MCH 30.6  MCHC 33.5  RDW 13.8  PLT 216   Thyroid  Recent Labs  Lab 04/19/23 2042  TSH 4.510*   BNP Recent Labs  Lab 04/19/23 1439  BNP 235.2*    DDimer No results for input(s): "DDIMER" in the last 168 hours.   Radiology/Studies:  CT Angio Chest PE W and/or Wo Contrast Result Date: 04/19/2023 CLINICAL DATA:  Atrial fibrillation, fatigue, shortness of breath EXAM: CT ANGIOGRAPHY CHEST WITH CONTRAST TECHNIQUE: Multidetector CT imaging of the chest was performed using the standard protocol during bolus administration of intravenous contrast. Multiplanar CT image reconstructions and MIPs were obtained to evaluate the vascular anatomy. RADIATION DOSE REDUCTION: This exam was performed according to the departmental dose-optimization program which includes automated exposure control, adjustment of the mA and/or kV according to patient size and/or use of iterative reconstruction technique. CONTRAST:  75mL OMNIPAQUE IOHEXOL 350 MG/ML SOLN COMPARISON:  Chest radiograph dated 04/19/2023. Cardiac CT dated 04/30/2015. FINDINGS: Cardiovascular: Satisfactory opacification the bilateral pulmonary arteries to the segmental level. No evidence of pulmonary embolism. Although not tailored for evaluation of the thoracic aorta, there is no evidence of thoracic aortic aneurysm or dissection. Cardiomegaly.  No pericardial effusion. Mild coronary atherosclerosis in the LAD and left circumflex. Mediastinum/Nodes: No suspicious mediastinal lymphadenopathy. Visualized thyroid is unremarkable. Lungs/Pleura: Lungs are essentially clear. Mild subpleural scarring in the lateral right lung. Mild linear scarring in the left lower lobe. No focal  consolidation. No suspicious pulmonary nodules. 4 mm calcified granuloma in the right middle lobe (series 6/image 76), benign. No pleural effusion or pneumothorax. Upper Abdomen: Visualized upper abdomen is notable for cholelithiasis and a tiny hiatal hernia. Musculoskeletal: Degenerative changes of the visualized thoracolumbar spine. Review of the MIP images confirms the above findings. IMPRESSION: No evidence of pulmonary embolism. Cardiomegaly. No acute cardiopulmonary disease. Electronically Signed   By: Charline Bills M.D.   On: 04/19/2023 19:19   DG Chest Port 1 View Result Date: 04/19/2023 CLINICAL DATA:  Atrial fibrillation. Fatigue and shortness of breath. EXAM: PORTABLE CHEST 1 VIEW COMPARISON:  Chest radiographs 06/15/2015 FINDINGS: Cardiac silhouette is again moderately enlarged and mediastinal contours are within normal limits. Resolution of the prior tenting of the right hemidiaphragm. The lungs are clear. No pleural effusion or pneumothorax. No acute skeletal abnormality. IMPRESSION: Moderate cardiomegaly. No acute lung process. Electronically Signed   By: Neita Garnet M.D.   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 H/o Non ischemic CMP--> Recovered/improved EF  Plan: -Patient denies any current chest pain, appears euvolemic, heart rate less than 100 on diltiazem drip, wean diltiazem drip, start Toprol-XL 25 mg twice daily, is been started on IV heparin GTT for anticoagulation. Trend troponins, EKG, obtain echocardiogram in a.m. N.p.o. after midnight for TEE cardioversion in the morning.  Please note patient has history of left atrial appendage amputation, need TEE to absolutely make sure there is no pouch left. Given recurrent arrhythmia consider EP  consultation. Full code   Risk Assessment/Risk Scores:  CHA2DS2-VASc Score =   1 for prior h/o CHF. Currently recovered EF.   This indicates a  % annual risk of stroke. The patient's score is based upon:       Code Status: Full Code  Severity of Illness: The appropriate patient status for this patient is OBSERVATION. Observation status is judged to be reasonable and necessary in order to provide the required intensity of service to ensure the patient's safety. The patient's presenting symptoms, physical exam findings, and initial radiographic and laboratory data in the context of their medical condition is felt to place them at decreased risk for further clinical deterioration. Furthermore, it is anticipated that the patient will be medically stable for discharge from the hospital within 2 midnights of admission.    For questions or updates, please contact [facility] Please consult [email or web address] for contact info under     Signed, Elmon Kirschner, MD  04/19/2023 10:03 PM

## 2023-04-19 NOTE — ED Triage Notes (Signed)
PT arrives via POV. Pt reports he believe he went into Afib last night around 2000. Pt reports fatigue and mild sob. Hx of afib, reports he is usually in NSR. Pt is AxOx4.

## 2023-04-19 NOTE — ED Provider Notes (Signed)
[name] EMERGENCY DEPARTMENT AT [facility] Provider Note   CSN: [id number] Arrival date & time: 04/19/23  1430     History  Chief Complaint  Patient presents with   Atrial Fibrillation    [name] is a 68 y.o. male.  Patient with history of afib status post Maze procedure in 2018 without any recurrence of afib since that time currently not on anticoagulation, CHF, hypertension, obesity presents today with complaints of fatigue. He states that he went bowling yesterday evening and while he was there he felt like his heart went into afib. He did not have any symptoms at that time. When he woke up this morning he felt slightly more tired than normal but did not have any other symptoms and was able to complete a full workday before coming in today. He states that he still feels like he is in afib and presents with concern for same. He denies any chest pain or shortness of breath. No leg pain or leg swelling. He does not take any daily medications at present as his blood pressure has been well controlled without medications and he has not had any weight gain or signs or fluid overload requiring him to take Lasix. He has not been sick recently. No fevers, chills, cough, nausea, vomiting, or abdominal pain.  The history is provided by the patient. No language interpreter was used.  Atrial Fibrillation       Home Medications Prior to Admission medications   Medication Sig Start Date End Date Taking? Authorizing Provider  carvedilol (COREG) 12.5 MG tablet Take 1 tablet (12.5 mg total) by mouth 2 (two) times daily with a meal. Appointment Required For Further Refills [phone] 06/25/19   Newman Nip, NP  furosemide (LASIX) 40 MG tablet TAKE ONE TABLET ONLY AS NEEDED FOR WEIGHT GAIN >3-5LBS 07/06/16   Newman Nip, NP  meloxicam (MOBIC) 7.5 MG tablet Take 1 tablet (7.5 mg total) by mouth daily as needed for pain. Patient not taking: Reported on 12/29/2016 11/07/16    Shade Flood, MD      Allergies    Penicillins, Zinacef [cefuroxime], and Cefuroxime axetil    Review of Systems   Review of Systems  Constitutional:  Positive for fatigue.  All other systems reviewed and are negative.   Physical Exam Updated Vital Signs BP (!) 133/97   Pulse (!) 103   Temp 98.1 F (36.7 C) (Oral)   Resp 16   Ht 5\' 7"  (1.702 m)   Wt 102.1 kg   SpO2 99%   BMI 35.24 kg/m  Physical Exam Vitals and nursing note reviewed.  Constitutional:      General: He is not in acute distress.    Appearance: Normal appearance. He is normal weight. He is not ill-appearing, toxic-appearing or diaphoretic.  HENT:     Head: Normocephalic and atraumatic.  Cardiovascular:     Rate and Rhythm: Tachycardia present. Rhythm irregular.     Heart sounds: Normal heart sounds.  Pulmonary:     Effort: Pulmonary effort is normal. No respiratory distress.     Breath sounds: Normal breath sounds.  Abdominal:     General: Abdomen is flat.     Palpations: Abdomen is soft.     Tenderness: There is no abdominal tenderness.  Musculoskeletal:        General: No tenderness. Normal range of motion.     Cervical back: Normal range of motion.     Right lower leg: No  edema.     Left lower leg: No edema.  Skin:    General: Skin is warm and dry.  Neurological:     General: No focal deficit present.     Mental Status: He is alert.  Psychiatric:        Mood and Affect: Mood normal.        Behavior: Behavior normal.     ED Results / Procedures / Treatments   Labs (all labs ordered are listed, but only abnormal results are displayed) Labs Reviewed  BASIC METABOLIC PANEL - Abnormal; Notable for the following components:      Result Value   Glucose, Bld 105 (*)    All other components within normal limits  BRAIN NATRIURETIC PEPTIDE - Abnormal; Notable for the following components:   B Natriuretic Peptide 235.2 (*)    All other components within normal limits  TROPONIN I (HIGH  SENSITIVITY) - Abnormal; Notable for the following components:   Troponin I (High Sensitivity) 360 (*)    All other components within normal limits  TROPONIN I (HIGH SENSITIVITY) - Abnormal; Notable for the following components:   Troponin I (High Sensitivity) 411 (*)    All other components within normal limits  CBC  MAGNESIUM  HEPATIC FUNCTION PANEL  TSH    EKG None  Radiology CT Angio Chest PE W and/or Wo Contrast Result Date: 04/19/2023 CLINICAL DATA:  Atrial fibrillation, fatigue, shortness of breath EXAM: CT ANGIOGRAPHY CHEST WITH CONTRAST TECHNIQUE: Multidetector CT imaging of the chest was performed using the standard protocol during bolus administration of intravenous contrast. Multiplanar CT image reconstructions and MIPs were obtained to evaluate the vascular anatomy. RADIATION DOSE REDUCTION: This exam was performed according to the departmental dose-optimization program which includes automated exposure control, adjustment of the mA and/or kV according to patient size and/or use of iterative reconstruction technique. CONTRAST:  75mL OMNIPAQUE IOHEXOL 350 MG/ML SOLN COMPARISON:  Chest radiograph dated 04/19/2023. Cardiac CT dated 04/30/2015. FINDINGS: Cardiovascular: Satisfactory opacification the bilateral pulmonary arteries to the segmental level. No evidence of pulmonary embolism. Although not tailored for evaluation of the thoracic aorta, there is no evidence of thoracic aortic aneurysm or dissection. Cardiomegaly.  No pericardial effusion. Mild coronary atherosclerosis in the LAD and left circumflex. Mediastinum/Nodes: No suspicious mediastinal lymphadenopathy. Visualized thyroid is unremarkable. Lungs/Pleura: Lungs are essentially clear. Mild subpleural scarring in the lateral right lung. Mild linear scarring in the left lower lobe. No focal consolidation. No suspicious pulmonary nodules. 4 mm calcified granuloma in the right middle lobe (series 6/image 76), benign. No pleural  effusion or pneumothorax. Upper Abdomen: Visualized upper abdomen is notable for cholelithiasis and a tiny hiatal hernia. Musculoskeletal: Degenerative changes of the visualized thoracolumbar spine. Review of the MIP images confirms the above findings. IMPRESSION: No evidence of pulmonary embolism. Cardiomegaly. No acute cardiopulmonary disease. Electronically Signed   By: Charline Bills M.D.   On: 04/19/2023 19:19   DG Chest Port 1 View Result Date: 04/19/2023 CLINICAL DATA:  Atrial fibrillation. Fatigue and shortness of breath. EXAM: PORTABLE CHEST 1 VIEW COMPARISON:  Chest radiographs 06/15/2015 FINDINGS: Cardiac silhouette is again moderately enlarged and mediastinal contours are within normal limits. Resolution of the prior tenting of the right hemidiaphragm. The lungs are clear. No pleural effusion or pneumothorax. No acute skeletal abnormality. IMPRESSION: Moderate cardiomegaly. No acute lung process. Electronically Signed   By: Neita Garnet M.D.   On: 04/19/2023 15:17    Procedures .Critical Care  Performed by: Silva Bandy, PA-C Authorized  by: Silva Bandy, PA-C   Critical care provider statement:    Critical care time (minutes):  75   Critical care was necessary to treat or prevent imminent or life-threatening deterioration of the following conditions:  Cardiac failure and circulatory failure   Critical care was time spent personally by me on the following activities:  Development of treatment plan with patient or surrogate, discussions with consultants, discussions with primary provider, evaluation of patient's response to treatment, examination of patient, obtaining history from patient or surrogate, ordering and review of laboratory studies, ordering and review of radiographic studies, pulse oximetry, re-evaluation of patient's condition and review of old charts   Care discussed with: admitting provider       Medications Ordered in ED Medications  diltiazem (CARDIZEM) 1  mg/mL load via infusion 20 mg (20 mg Intravenous Bolus from Bag 04/19/23 1816)    And  diltiazem (CARDIZEM) 125 mg in dextrose 5% 125 mL (1 mg/mL) infusion (5 mg/hr Intravenous New Bag/Given 04/19/23 1814)  sodium chloride 0.9 % bolus 1,000 mL (1,000 mLs Intravenous New Bag/Given 04/19/23 1646)  iohexol (OMNIPAQUE) 350 MG/ML injection 75 mL (75 mLs Intravenous Contrast Given 04/19/23 1904)    ED Course/ Medical Decision Making/ A&P         CHA2DS2-VASc Score: 3                        Medical Decision Making Amount and/or Complexity of Data Reviewed Labs: ordered. Radiology: ordered.  Risk Prescription drug management.   This patient is a 68 y.o. male who presents to the ED for concern of fatigue, this involves an extensive number of treatment options, and is a complaint that carries with it a high risk of complications and morbidity. The emergent differential diagnosis prior to evaluation includes, but is not limited to,  ACS/PE, arrhythmia, sepsis. This is not an exhaustive differential.   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  Additional history: Chart reviewed. Pertinent results include: Patient with maze procedure in 2018, no afib since. Most recent echo showed EF 60-65% in 2017.  Physical Exam: Physical exam performed. The pertinent findings include: overall well appearing, does not appear fluid overloaded. HR 130s  Lab Tests: I ordered, and personally interpreted labs.  The pertinent results include:   Troponin 360 --> 411. BNP 235   Imaging Studies: I ordered imaging studies including CXR, CTA PE. I independently visualized and interpreted imaging which showed   CXR: Moderate cardiomegaly. No acute lung process.   CTA:   No evidence of pulmonary embolism.   Cardiomegaly.   No acute cardiopulmonary disease.  I agree with the radiologist interpretation.   Cardiac Monitoring:  The patient was maintained on a cardiac monitor.  My attending physician Dr. Wilkie Aye viewed and interpreted the cardiac monitored which showed an underlying rhythm of: initially rates in the 130s, unclear sinus tach vs afib/flutter. After cardizem, rates decreased to 90s, clearly afib/flutter. I agree with this interpretation.   Medications: I ordered medication including cardizem, fluids, heparin  for afib rvr, dehydration, afib. Reevaluation of the patient after these medicines showed that the patient improved. I  have reviewed the patients home medicines and have made adjustments as needed.  Consultations Obtained: I requested consultation with the cardiology on call Dr. Odis Hollingshead,  and discussed lab and imaging findings as well as pertinent plan - they recommend: they will see and admit the patient   Disposition: After consideration of the diagnostic results and the patients response to treatment, I feel that patient will require admission for afib rvr on cardizem drip with elevated troponins. Discussed same with patient who is understanding and in agreement with this.   Discussed patient with cardiology Dr. Odis Hollingshead who accepts patient for admission.   I discussed this case with my attending physician Dr. Wilkie Aye who cosigned this note including patient's presenting symptoms, physical exam, and planned diagnostics and interventions. Attending physician stated agreement with plan or made changes to plan which were implemented.    Final Clinical Impression(s) / ED Diagnoses Final diagnoses:  Atrial fibrillation with  RVR (HCC)  Elevated troponin  Other fatigue    Rx / DC Orders ED Discharge Orders     None         Silva Bandy, PA-C 04/19/23 2010    Rozelle Logan, DO 04/20/23 1158

## 2023-04-19 NOTE — Progress Notes (Signed)
ANTICOAGULATION CONSULT NOTE  Pharmacy Consult for Heparin Indication: atrial fibrillation  Allergies  Allergen Reactions   Penicillins Other (See Comments)    ## SYNCOPE ## (Tolerates amoxicillin) 05/28/15-Pt interview, reaction was "fainted after PCN as a child" Has patient had a PCN reaction causing immediate rash, facial/tongue/throat swelling, SOB or lightheadedness with hypotension: Yes Has patient had a PCN reaction causing severe rash involving mucus membranes or skin necrosis: No Has patient had a PCN reaction that required hospitalization No Has patient had a PCN reaction occurring within the last 10 years: No If all of the above answers are "NO   Zinacef [Cefuroxime] Anaphylaxis    Patient reacted to 1 ml Test dose of Zinacef in OR.   Cefuroxime Axetil Rash    Experienced redness and reaction in OR    Patient Measurements: Height: 5\' 7"  (170.2 cm) Weight: 102.1 kg (225 lb) IBW/kg (Calculated) : 66.1 Heparin Dosing Weight: 88.5 kg  Vital Signs: Temp: 98.1 F (36.7 C) (02/19 1848) Temp Source: Oral (02/19 1848) BP: 133/97 (02/19 1915) Pulse Rate: 103 (02/19 1915)  Labs: Recent Labs    04/19/23 1439 04/19/23 1527 04/19/23 1806  HGB 16.5  --   --   HCT 49.2  --   --   PLT 216  --   --   CREATININE 1.10  --   --   TROPONINIHS  --  360* 411*    Estimated Creatinine Clearance: 75.2 mL/min (by C-G formula based on SCr of 1.1 mg/dL).   Medical History: Past Medical History:  Diagnosis Date   ALLERGIC RHINITIS    Arthritis    Atrial flutter (HCC)    s/p CTI ablation 2006 by Ladona Ridgel   Atypical atrial flutter (HCC)    CAD (coronary artery disease) 03/25/15   nonobstructive by cath   Chronic diastolic (congestive) heart failure (HCC)    Dysrhythmia    AFIB   Essential hypertension    Non-ischemic cardiomyopathy (HCC)    tachycardiac mediated   Obesity    Persistent atrial fibrillation (HCC)    a. s/p afib ablation 02/13/10 and redo ablation 10/26/12    Rheumatic fever    childhood   S/P Minimally invasive maze operation for atrial fibrillation 05/28/2015   Complete bilateral atrial lesion set using cryothermy and bipolar radiofrequency ablation with oversewing of LA appendage via right mini thoracotomy approach    Shortness of breath dyspnea    W/ EXERTION    Sleep apnea    PATIENT STATES HE DOES NOT HAVE    Medications:  (Not in a hospital admission)  Scheduled:  Infusions:   diltiazem (CARDIZEM) infusion 5 mg/hr (04/19/23 1814)   PRN:   Assessment: 67 yom with a history of AF s/p Maze (2018) not on AC, HF, HTN, obesity. Patient is presenting with fatigue. Heparin per pharmacy consult placed for atrial fibrillation.  Patient is not on anticoagulation prior to arrival.  Hgb 16.5; plt 216  Goal of Therapy:  Heparin level 0.3-0.7 units/ml Monitor platelets by anticoagulation protocol: Yes   Plan:  Give IV heparin 4500 units bolus x 1 Start heparin infusion at 1400 units/hr Check anti-Xa level at 0500 and daily while on heparin Continue to monitor H&H and platelets  Delmar Landau, PharmD, BCPS 04/19/2023 8:20 PM ED Clinical Pharmacist -  [phone]

## 2023-04-20 ENCOUNTER — Observation Stay (HOSPITAL_BASED_OUTPATIENT_CLINIC_OR_DEPARTMENT_OTHER): Payer: Managed Care, Other (non HMO)

## 2023-04-20 DIAGNOSIS — I4891 Unspecified atrial fibrillation: Secondary | ICD-10-CM

## 2023-04-20 DIAGNOSIS — I2489 Other forms of acute ischemic heart disease: Secondary | ICD-10-CM | POA: Diagnosis not present

## 2023-04-20 DIAGNOSIS — I4892 Unspecified atrial flutter: Secondary | ICD-10-CM

## 2023-04-20 LAB — BASIC METABOLIC PANEL
Anion gap: 10 (ref 5–15)
BUN: 16 mg/dL (ref 8–23)
CO2: 26 mmol/L (ref 22–32)
Calcium: 8.9 mg/dL (ref 8.9–10.3)
Chloride: 103 mmol/L (ref 98–111)
Creatinine, Ser: 1.29 mg/dL — ABNORMAL HIGH (ref 0.61–1.24)
GFR, Estimated: >60 mL/min (ref >60)
Glucose, Bld: 110 mg/dL — ABNORMAL HIGH (ref 70–99)
Potassium: 4 mmol/L (ref 3.5–5.1)
Sodium: 139 mmol/L (ref 135–145)

## 2023-04-20 LAB — ECHOCARDIOGRAM COMPLETE
AR max vel: 3.26 cm2
AV Peak grad: 3.2 mm[Hg]
Ao pk vel: 0.89 m/s
Height: 67 in
S' Lateral: 3.6 cm
Weight: 3600 [oz_av]

## 2023-04-20 LAB — CBC
HCT: 47.9 % (ref 39.0–52.0)
Hemoglobin: 16.2 g/dL (ref 13.0–17.0)
MCH: 30.9 pg (ref 26.0–34.0)
MCHC: 33.8 g/dL (ref 30.0–36.0)
MCV: 91.4 fL (ref 80.0–100.0)
Platelets: 186 10*3/uL (ref 150–400)
RBC: 5.24 MIL/uL (ref 4.22–5.81)
RDW: 13.8 % (ref 11.5–15.5)
WBC: 7.7 10*3/uL (ref 4.0–10.5)
nRBC: 0 % (ref 0.0–0.2)

## 2023-04-20 LAB — HIV ANTIBODY (ROUTINE TESTING W REFLEX): HIV Screen 4th Generation wRfx: NONREACTIVE

## 2023-04-20 LAB — TROPONIN I (HIGH SENSITIVITY)
Troponin I (High Sensitivity): 237 ng/L (ref <18)
Troponin I (High Sensitivity): 145 ng/L (ref <18)

## 2023-04-20 LAB — HEPARIN LEVEL (UNFRACTIONATED): Heparin Unfractionated: 0.31 [IU]/mL (ref 0.30–0.70)

## 2023-04-20 LAB — T4, FREE: Free T4: 0.96 ng/dL (ref 0.61–1.12)

## 2023-04-20 MED ORDER — ACETAMINOPHEN 325 MG PO TABS: 650.0000 mg | ORAL_TABLET | ORAL | Status: DC | PRN

## 2023-04-20 MED ORDER — METOPROLOL TARTRATE 25 MG PO TABS
25.0000 mg | ORAL_TABLET | Freq: Four times a day (QID) | ORAL | Status: DC
  Administered 2023-04-20 – 2023-04-21 (×5): 25 mg via ORAL
  Filled 2023-04-20 (×5): qty 1

## 2023-04-20 MED ORDER — ONDANSETRON HCL 4 MG/2ML IJ SOLN: 4.0000 mg | Freq: Four times a day (QID) | INTRAMUSCULAR | Status: DC | PRN

## 2023-04-20 MED ORDER — ATORVASTATIN CALCIUM 40 MG PO TABS
40.0000 mg | ORAL_TABLET | Freq: Every day | ORAL | Status: DC
  Administered 2023-04-20 – 2023-04-21 (×2): 40 mg via ORAL
  Filled 2023-04-20 (×2): qty 1

## 2023-04-20 MED ORDER — APIXABAN 5 MG PO TABS
5.0000 mg | ORAL_TABLET | Freq: Two times a day (BID) | ORAL | Status: DC
  Administered 2023-04-20 – 2023-04-21 (×3): 5 mg via ORAL
  Filled 2023-04-20 (×4): qty 1

## 2023-04-20 MED ORDER — SODIUM CHLORIDE 0.9% FLUSH: 3.0000 mL | INTRAVENOUS | Status: DC | PRN

## 2023-04-20 MED ORDER — SODIUM CHLORIDE 0.9% FLUSH: 3.0000 mL | Freq: Two times a day (BID) | INTRAVENOUS | Status: DC

## 2023-04-20 NOTE — Discharge Instructions (Signed)

## 2023-04-20 NOTE — Progress Notes (Signed)
Echocardiogram 2D Echocardiogram has been performed.  Raksha Wolfgang N Lulubelle Simcoe,RDCS 04/20/2023, 2:27 PM

## 2023-04-20 NOTE — Progress Notes (Addendum)
ANTICOAGULATION CONSULT NOTE Pharmacy Consult for Heparin Indication: atrial fibrillation Brief A/P: Heparin level at low end of goal range Increase Heparin rate   Allergies  Allergen Reactions   Penicillins Other (See Comments)    ## SYNCOPE ## (Tolerates amoxicillin) 05/28/15-Pt interview, reaction was "fainted after PCN as a child" Has patient had a PCN reaction causing immediate rash, facial/tongue/throat swelling, SOB or lightheadedness with hypotension: Yes Has patient had a PCN reaction causing severe rash involving mucus membranes or skin necrosis: No Has patient had a PCN reaction that required hospitalization No Has patient had a PCN reaction occurring within the last 10 years: No If all of the above answers are "NO   Zinacef [Cefuroxime] Anaphylaxis    Patient reacted to 1 ml Test dose of Zinacef in OR.   Cefuroxime Axetil Rash    Experienced redness and reaction in OR    Patient Measurements: Height: 5\' 7"  (170.2 cm) Weight: 102.1 kg (225 lb) IBW/kg (Calculated) : 66.1 Heparin Dosing Weight: 88.5 kg  Vital Signs: Temp: 98 F (36.7 C) (02/20 0300) Temp Source: Oral (02/19 2255) BP: 112/88 (02/20 0500) Pulse Rate: 93 (02/20 0500)  Labs: Recent Labs    04/19/23 1439 04/19/23 1527 04/19/23 1806 04/20/23 0053 04/20/23 0256 04/20/23 0400  HGB 16.5  --   --   --  16.2  --   HCT 49.2  --   --   --  47.9  --   PLT 216  --   --   --  186  --   HEPARINUNFRC  --   --   --   --   --  0.31  CREATININE 1.10  --   --   --   --   --   TROPONINIHS  --  360* 411* 237*  --   --     Estimated Creatinine Clearance: 75.2 mL/min (by C-G formula based on SCr of 1.1 mg/dL).  Assessment: 68 y.o. male with Afib/flutter for heparin, awaiting poss TTE cardioversion today  Goal of Therapy:  Heparin level 0.3-0.7 units/ml Monitor platelets by anticoagulation protocol: Yes   Plan:  Increase Heparin 1550 units/hr to ensure within goal range for cardioversion F/U plans for oral  anticoagulation  Geannie Risen, PharmD, BCPS

## 2023-04-20 NOTE — ED Notes (Signed)
 Echo at bedside

## 2023-04-20 NOTE — Progress Notes (Signed)
Cardiology Progress Note  Patient ID: [name] MRN: [id number] DOB: 1955/06/25 Date of Encounter: 04/20/2023 Primary Cardiologist: None  Subjective   Chief Complaint: Tachycardia   HPI: Remains in atrial flutter.  No symptoms.  ROS:  All other ROS reviewed and negative. Pertinent positives noted in the HPI.     Telemetry  Overnight telemetry shows atrial flutter heart rate 100s, which I personally reviewed.   ECG  The most recent ECG shows atrial flutter heart rate 82, atypical, which I personally reviewed.   Physical Exam   Vitals:   04/20/23 0500 04/20/23 0600 04/20/23 0646 04/20/23 0700  BP: 112/88 (!) 126/102  (!) 122/93  Pulse: 93 (!) 101  62  Resp: 18 18  18   Temp:   98.4 F (36.9 C)   TempSrc:      SpO2: 97% 96%  94%  Weight:      Height:        Intake/Output Summary (Last 24 hours) at 04/20/2023 0804 Last data filed at 04/19/2023 1900 Gross per 24 hour  Intake 1000 ml  Output --  Net 1000 ml       04/19/2023    2:38 PM 02/12/2023    5:50 PM 12/29/2016    2:09 PM  Last 3 Weights  Weight (lbs) 225 lb 219 lb 5.7 oz 219 lb 6.4 oz  Weight (kg) 102.059 kg 99.5 kg 99.519 kg    Body mass index is 35.24 kg/m.  General: Well nourished, well developed, in no acute distress Head: Atraumatic, normal size  Eyes: PEERLA, EOMI  Neck: Supple, no JVD Endocrine: No thryomegaly Cardiac: Normal S1, S2; irregular rhythm, no murmurs Lungs: Clear to auscultation bilaterally, no wheezing, rhonchi or rales  Abd: Soft, nontender, no hepatomegaly  Ext: No edema, pulses 2+ Musculoskeletal: No deformities, BUE and BLE strength normal and equal Skin: Warm and dry, no rashes   Neuro: Alert and oriented to person, place, time, and situation, CNII-XII grossly intact, no focal deficits  Psych: Normal mood and affect   Cardiac Studies  Echo pending  LHC 03/25/2015 Mid LAD lesion, 30% stenosed. Prox LAD lesion, 30% stenosed. Ost LAD to Prox LAD lesion, 20%  stenosed. There is moderate to severe left ventricular systolic dysfunction.  Patient Profile  [name] is a 68 y.o. male with history of persistent atrial fibrillation status post surgical maze with surgical left atrial appendage closure in 2017, nonischemic cardiomyopathy with recovery of ejection fraction, nonobstructive CAD admitted on 04/19/2023 for atrial flutter.  Assessment & Plan   # Atrial flutter, atypical -Looks like he has had recurrence of his arrhythmia.  Seems to almost represent an atypical flutter.  This could be that given prior history of surgical maze. -Feeling much better on diltiazem drip.  Continue this. -Add metoprolol to tartrate 25 mg every 6 hours.  Titrate up for better rate control. -Repeat echo. -Normal kidney function.  TSH is mildly elevated.  We will check a free T4 and T3. -We will go ahead and start him on Eliquis 5 mg twice daily.  Plan for TEE/cardioversion tomorrow.  N.p.o. at midnight.  # Elevated troponin, demand ischemia # Nonobstructive CAD -Known history of nonobstructive CAD.  Troponins are minimally elevated and trending down.  I do not believe this represents an acute coronary syndrome.  I think this is secondary to his atrial flutter. -We will check lipids.  Start him on Lipitor 40 mg daily. -Hold aspirin.  He will be on Eliquis.  # Abnormal  thyroid labs -Follow-up free T4 and T3.  FEN -Code: Full -Diet: Heart healthy, n.p.o. at midnight -DVT PPx: Eliquis 5 mg twice daily -Disposition: TEE/cardioversion tomorrow.  Anticipate discharge pending workup and results of echo.      For questions or updates, please contact [facility] Please consult [email or web address] for contact info under        Signed, Gerri Spore T. Flora Lipps, MD, [facility]  [facility]  04/20/2023 8:04 AM

## 2023-04-21 ENCOUNTER — Encounter (HOSPITAL_COMMUNITY)
Admission: EM | Disposition: A | Discharge status: Home / Self Care | Payer: Self-pay | Source: Home / Self Care | Attending: Emergency Medicine

## 2023-04-21 ENCOUNTER — Other Ambulatory Visit (HOSPITAL_COMMUNITY): Payer: Self-pay

## 2023-04-21 ENCOUNTER — Telehealth (HOSPITAL_COMMUNITY): Payer: Self-pay | Admitting: Pharmacy Technician

## 2023-04-21 ENCOUNTER — Encounter (HOSPITAL_COMMUNITY): Payer: Self-pay | Admitting: Cardiology

## 2023-04-21 ENCOUNTER — Observation Stay (HOSPITAL_COMMUNITY): Payer: Managed Care, Other (non HMO) | Admitting: Anesthesiology

## 2023-04-21 ENCOUNTER — Observation Stay (HOSPITAL_COMMUNITY): Payer: Managed Care, Other (non HMO)

## 2023-04-21 DIAGNOSIS — R7989 Other specified abnormal findings of blood chemistry: Secondary | ICD-10-CM | POA: Diagnosis not present

## 2023-04-21 DIAGNOSIS — Z8679 Personal history of other diseases of the circulatory system: Secondary | ICD-10-CM | POA: Diagnosis not present

## 2023-04-21 DIAGNOSIS — Z9889 Other specified postprocedural states: Secondary | ICD-10-CM

## 2023-04-21 DIAGNOSIS — I4819 Other persistent atrial fibrillation: Secondary | ICD-10-CM

## 2023-04-21 DIAGNOSIS — I4891 Unspecified atrial fibrillation: Secondary | ICD-10-CM

## 2023-04-21 DIAGNOSIS — I4892 Unspecified atrial flutter: Secondary | ICD-10-CM | POA: Diagnosis not present

## 2023-04-21 DIAGNOSIS — I484 Atypical atrial flutter: Secondary | ICD-10-CM | POA: Diagnosis not present

## 2023-04-21 DIAGNOSIS — I509 Heart failure, unspecified: Secondary | ICD-10-CM | POA: Diagnosis not present

## 2023-04-21 DIAGNOSIS — I2510 Atherosclerotic heart disease of native coronary artery without angina pectoris: Secondary | ICD-10-CM | POA: Diagnosis not present

## 2023-04-21 DIAGNOSIS — I110 Hypertensive heart disease with heart failure: Secondary | ICD-10-CM

## 2023-04-21 HISTORY — PX: CARDIOVERSION: EP1203

## 2023-04-21 HISTORY — PX: TRANSESOPHAGEAL ECHOCARDIOGRAM (CATH LAB): EP1270

## 2023-04-21 LAB — ECHO TEE

## 2023-04-21 LAB — LIPID PANEL
Cholesterol: 223 mg/dL — ABNORMAL HIGH (ref 0–200)
HDL: 33 mg/dL — ABNORMAL LOW (ref >40)
LDL Cholesterol: 156 mg/dL — ABNORMAL HIGH (ref 0–99)
Total CHOL/HDL Ratio: 6.8 {ratio}
Triglycerides: 172 mg/dL — ABNORMAL HIGH (ref <150)
VLDL: 34 mg/dL (ref 0–40)

## 2023-04-21 LAB — HEMOGLOBIN A1C
Hgb A1c MFr Bld: 5.6 % (ref 4.8–5.6)
Mean Plasma Glucose: 114.02 mg/dL

## 2023-04-21 LAB — BASIC METABOLIC PANEL
Anion gap: 10 (ref 5–15)
BUN: 15 mg/dL (ref 8–23)
CO2: 26 mmol/L (ref 22–32)
Calcium: 9 mg/dL (ref 8.9–10.3)
Chloride: 103 mmol/L (ref 98–111)
Creatinine, Ser: 0.98 mg/dL (ref 0.61–1.24)
GFR, Estimated: >60 mL/min (ref >60)
Glucose, Bld: 106 mg/dL — ABNORMAL HIGH (ref 70–99)
Potassium: 3.8 mmol/L (ref 3.5–5.1)
Sodium: 139 mmol/L (ref 135–145)

## 2023-04-21 LAB — T3: T3, Total: 112 ng/dL (ref 71–180)

## 2023-04-21 SURGERY — TRANSESOPHAGEAL ECHOCARDIOGRAM (TEE) (CATHLAB)
Anesthesia: General

## 2023-04-21 MED ORDER — METOPROLOL TARTRATE 50 MG PO TABS: 50.0000 mg | ORAL_TABLET | Freq: Two times a day (BID) | ORAL | Status: DC

## 2023-04-21 MED ORDER — LIDOCAINE 2% (20 MG/ML) 5 ML SYRINGE
INTRAMUSCULAR | Status: DC | PRN
  Administered 2023-04-21: 100 mg via INTRAVENOUS

## 2023-04-21 MED ORDER — METOPROLOL TARTRATE 25 MG PO TABS: 25.0000 mg | ORAL_TABLET | Freq: Two times a day (BID) | ORAL | Status: DC

## 2023-04-21 MED ORDER — APIXABAN 5 MG PO TABS
5.0000 mg | ORAL_TABLET | Freq: Two times a day (BID) | ORAL | 11 refills | Status: DC
  Filled 2023-04-21: qty 60, 30d supply, fill #0

## 2023-04-21 MED ORDER — METOPROLOL TARTRATE 25 MG PO TABS
25.0000 mg | ORAL_TABLET | Freq: Two times a day (BID) | ORAL | 3 refills | Status: DC
  Filled 2023-04-21: qty 60, 30d supply, fill #0

## 2023-04-21 MED ORDER — PROPOFOL 500 MG/50ML IV EMUL
INTRAVENOUS | Status: DC | PRN
  Administered 2023-04-21: 125 ug/kg/min via INTRAVENOUS

## 2023-04-21 MED ORDER — ATORVASTATIN CALCIUM 40 MG PO TABS
40.0000 mg | ORAL_TABLET | Freq: Every day | ORAL | 3 refills | Status: DC
  Filled 2023-04-21: qty 30, 30d supply, fill #0

## 2023-04-21 MED ORDER — PROPOFOL 10 MG/ML IV BOLUS
INTRAVENOUS | Status: DC | PRN
  Administered 2023-04-21: 80 mg via INTRAVENOUS

## 2023-04-21 MED ORDER — PHENYLEPHRINE HCL (PRESSORS) 10 MG/ML IV SOLN
INTRAVENOUS | Status: DC | PRN
Start: 2023-04-21 — End: 2023-04-21
  Administered 2023-04-21: 80 ug via INTRAVENOUS
  Administered 2023-04-21 (×3): 160 ug via INTRAVENOUS

## 2023-04-21 MED ORDER — SODIUM CHLORIDE 0.9 % IV SOLN: INTRAVENOUS | Status: DC | PRN

## 2023-04-21 SURGICAL SUPPLY — 1 items: PAD DEFIB RADIO PHYSIO CONN (PAD) ×2 IMPLANT

## 2023-04-21 NOTE — Anesthesia Preprocedure Evaluation (Addendum)
Anesthesia Evaluation  Patient identified by MRN, date of birth, ID band Patient awake    Reviewed: Allergy & Precautions, NPO status , Patient's Chart, lab work & pertinent test results  Airway Mallampati: II  TM Distance: >3 FB Neck ROM: Full    Dental no notable dental hx.    Pulmonary sleep apnea    Pulmonary exam normal        Cardiovascular hypertension, + CAD and +CHF  + dysrhythmias Atrial Fibrillation  Rhythm:Irregular Rate:Normal  ECHO:   1. Left ventricular ejection fraction, by estimation, is 60 to 65%. The left ventricle has normal function. Left ventricular endocardial border not optimally defined to evaluate regional wall motion. There is mild left ventricular hypertrophy of the infero-lateral segment. Left ventricular diastolic parameters are indeterminate.  2. Right ventricular systolic function is mildly reduced. The right ventricular size is normal. Tricuspid regurgitation signal is inadequate for assessing PA pressure.  3. The mitral valve is degenerative. Trivial mitral valve regurgitation. No evidence of mitral stenosis.  4. The aortic valve is tricuspid. Aortic valve regurgitation is not visualized. No aortic stenosis is present.  5. The inferior vena cava is normal in size with greater than 50% respiratory variability, suggesting right atrial pressure of 3 mmHg.    Neuro/Psych negative neurological ROS  negative psych ROS   GI/Hepatic negative GI ROS, Neg liver ROS,,,  Endo/Other  negative endocrine ROS    Renal/GU negative Renal ROS  negative genitourinary   Musculoskeletal  (+) Arthritis , Osteoarthritis,    Abdominal Normal abdominal exam  (+)   Peds  Hematology   Anesthesia Other Findings   Reproductive/Obstetrics                             Anesthesia Physical Anesthesia Plan  ASA: 3  Anesthesia Plan: MAC   Post-op Pain Management:    Induction:  Intravenous  PONV Risk Score and Plan: 2 and Treatment may vary due to age or medical condition and Propofol infusion  Airway Management Planned: Simple Face Mask and Nasal Cannula  Additional Equipment: None  Intra-op Plan:   Post-operative Plan:   Informed Consent: I have reviewed the patients History and Physical, chart, labs and discussed the procedure including the risks, benefits and alternatives for the proposed anesthesia with the patient or authorized representative who has indicated his/her understanding and acceptance.     Dental advisory given  Plan Discussed with: CRNA  Anesthesia Plan Comments:        Anesthesia Quick Evaluation

## 2023-04-21 NOTE — CV Procedure (Signed)
     PROCEDURE NOTE:  Procedure:  Transesophageal echocardiogram Operator:  Armanda Magic, MD Indications:  Atrial Flutter Complications: None  During this procedure the patient is administered a total of Lidocaine 100mg , Propofol 250mg  and Phenyleprhine to achieve and maintain moderate conscious sedation.  The patient's heart rate, blood pressure, and oxygen saturation are monitored continuously during the procedure. The period of conscious sedation is 10 minutes, of which I was present face-to-face 100% of this time. Adam Davis, CRNA is an independent, trained observer who assisted in the monitoring of the patient's level of consciousness.    Results: Normal LV size and function Normal RV size and mildly reduced RVF Normal RA Normal LA with no thrombus.  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   Electrical Cardioversion Procedure Note [name] [id number] 01-21-1956  Procedure: Electrical Cardioversion Indications:  Atrial Flutter  Time Out: Verified patient identification, verified procedure,medications/allergies/relevent history reviewed, required imaging and test results available.  Performed  Procedure Details  Cardioversion was done with synchronized biphasic defibrillation with AP pads with 200watts.  The patient converted to normal sinus rhythm. The patient tolerated the procedure well   IMPRESSION:  Successful cardioversion of atrial flutter The patient tolerated the procedure well and was transferred back to their room in stable condition.  Signed: Armanda Magic, MD [facility]

## 2023-04-21 NOTE — TOC CM/SW Note (Signed)
Transition of Care [facility]) - Inpatient Brief Assessment   Patient Details  Name: [name] MRN: [id number] Date of Birth: [birth year]  [facility] CM/SW Contact:    Gala Lewandowsky, RN Phone Number: 04/21/2023, 3:02 PM   Clinical Narrative: Patient presented for palpitations. Benefits check completed for Eliquis. No further needs identified.     Transition of Care Asessment: Insurance and Status: Insurance coverage has been reviewed Patient has primary care physician: Yes Prior/Current Home Services: No current home services Social Drivers of Health Review: SDOH reviewed no interventions necessary Readmission risk has been reviewed: Yes Transition of care needs: no transition of care needs at this time

## 2023-04-21 NOTE — Anesthesia Postprocedure Evaluation (Signed)
Anesthesia Post Note  Patient: [name]  Procedure(s) Performed: TRANSESOPHAGEAL ECHOCARDIOGRAM CARDIOVERSION     Patient location during evaluation: PACU Anesthesia Type: General Level of consciousness: awake and alert Pain management: pain level controlled Vital Signs Assessment: post-procedure vital signs reviewed and stable Respiratory status: spontaneous breathing, nonlabored ventilation, respiratory function stable and patient connected to nasal cannula oxygen Cardiovascular status: stable and blood pressure returned to baseline Postop Assessment: no apparent nausea or vomiting Anesthetic complications: no   There were no known notable events for this encounter.  Last Vitals:  Vitals:   04/21/23 0905 04/21/23 0923  BP: 102/72 107/74  Pulse: (!) 52   Resp: 11 15  Temp:  36.5 C  SpO2: 96%     Last Pain:  Vitals:   04/21/23 0923  TempSrc: Oral  PainSc:                  Nelle Don Tanika Bracco

## 2023-04-21 NOTE — Discharge Summary (Signed)
Discharge Summary    Patient ID: [name] MRN: [id number]; DOB: [birth year]  Admit date: 04/19/2023 Discharge date: 04/21/2023  PCP:  Pcp, No    HeartCare Providers Cardiologist:  None       Discharge Diagnoses    Principal Problem:   Atrial flutter with rapid ventricular response [facility]) Active Problems:   Atrial fibrillation (HCC)   S/P Minimally invasive maze operation for atrial fibrillation    Diagnostic Studies/Procedures    Echocardiogram 04/20/23 1. Left ventricular ejection fraction, by estimation, is 60 to 65%. The  left ventricle has normal function. Left ventricular endocardial border  not optimally defined to evaluate regional wall motion. There is mild left  ventricular hypertrophy of the  infero-lateral segment. Left ventricular diastolic parameters are  indeterminate.   2. Right ventricular systolic function is mildly reduced. The right  ventricular size is normal. Tricuspid regurgitation signal is inadequate  for assessing PA pressure.   3. The mitral valve is degenerative. Trivial mitral valve regurgitation.  No evidence of mitral stenosis.   4. The aortic valve is tricuspid. Aortic valve regurgitation is not  visualized. No aortic stenosis is present.   5. The inferior vena cava is normal in size with greater than 50%  respiratory variability, suggesting right atrial pressure of 3 mmHg.   TEE guided DCCV 04/20/13  1. Left ventricular ejection fraction, by estimation, is 60 to 65%. The  left ventricle has normal function. The left ventricle has no regional  wall motion abnormalities.   2. Right ventricular systolic function is mildly reduced. The right  ventricular size is normal.   3. S/P LA appendage oversewing with no evidence of residual appendage. No  left atrial/left atrial appendage thrombus was detected.   4. The mitral valve is normal in structure. Trivial mitral valve  regurgitation. No evidence of mitral stenosis.   5. The  aortic valve is normal in structure. Aortic valve regurgitation is  trivial. No aortic stenosis is present.   6. The inferior vena cava is normal in size with greater than 50%  respiratory variability, suggesting right atrial pressure of 3 mmHg.   Conclusion(s)/Recommendation(s): Normal biventricular function without  evidence of hemodynamically significant valvular heart disease. No LA/LAA  thrombus identified. Successful cardioversion performed with restoration  of normal sinus rhythm.  _____________   History of Present Illness     [name] is a 68 y.o. 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  Patient previously underwent left heart catheterization 03/2015 that showed nonobstructive CAD with 20-30% smooth narrowings in the proximal and mid LAD, otherwise normal coronary arteries.  EF was 30-35%.  He underwent maze procedure in 2017.  Later, echocardiogram in 08/2015 showed EF improved to 60-65%, mild LVH.  Patient did well after his maze procedure and was taken off of anticoagulation.  Was last seen by cardiology in 2018.  Patient presented to the ED on 04/19/2023 complaining of fatigue, felt like he had been back in atrial fibrillation.  He was able to do his ADLs and work, but continued to feel weak so he came to the ED.  EKG showed atrial flutter with variable block, heart rate in the 130s.  He was started on IV diltiazem.  High-sensitivity troponin 360>> 411.  Chest x-ray showed moderate cardiomegaly with no acute lung process.  CTA chest showed no evidence  of PE, no acute cardiopulmonary disease, cardiomegaly.  Patient was admitted to the cardiology service for further treatment  Hospital Course     Consultants: None  Patient was admitted to the cardiology service on 2/19 for treatment of atrial flutter with RVR,  elevated troponin.  His heart rate was overall well-controlled on diltiazem drip.  He was started on Eliquis 5 mg twice daily.  Underwent echocardiogram on 04/20/23 that showed EF 60-65%, mild LVH, mildly reduced RV systolic function.   On 2/21, patient was taken to the Cath Lab and underwent TEE guided DCCV.  He successfully converted to normal sinus rhythm.  He tolerated the procedure well.  He was seen by Dr. Flora Lipps and was deemed stable for discharge.  Atypical Atrial Flutter History of atrial fibrillation, s/p MAZE and LAA closure  - Patient now s/p TEE guided DCCV. Was maintaining NSR with HR in the 60s- 70s prior to DC - TSH mildly elevated.  Free T4 is normal. Echo with EF 60-65% - Continue metoprolol tartrate 25 mg BID - Continue eliquis 5 mg BID  - Scheduled for follow up on 05/03/23   Elevated troponin  Mild, nonobstructive CAD - hsTn 360>411>237>145 - Echocardiogram this admission showed EF 60-65%.  Previous left heart catheterization 03/2015 showed nonobstructive CAD with 20-30% smooth narrowings in proximal and mid LAD, otherwise normal coronary arteries -Patient denies any chest pain. -Suspect troponin elevation is related to atrial flutter with RVR.  Could consider outpatient stress test -Continue high intensity statin.  Not on aspirin given Eliquis use  HFimpEF  -Patient previously had EF down to 30-35% in 2017.  This was in the setting of atrial fibrillation.  After Maze procedure, EF improved to 60-65% - Echo this admission showed EF 60-65% - He is euvolemic on exam   HLD  - Lipid panel this admission showed LDL 156 - Started lipitor 40 mg daily this admission. Continue at DC - Repeat lipid panel and LFTs in 8 weeks   Patient has outpatient follow up with general cardiology on 05/03/23.   Did the patient have an acute coronary syndrome (MI, NSTEMI, STEMI, etc) this admission?:  No                               Did the patient have a percutaneous coronary intervention  (stent / angioplasty)?:  No.    _____________  Discharge Vitals Blood pressure 107/74, pulse (!) 52, temperature 97.7 F (36.5 C), temperature source Oral, resp. rate 15, height 5\' 7"  (1.702 m), weight 98.7 kg, SpO2 96%.  Filed Weights   04/19/23 1438 04/20/23 1515  Weight: 102.1 kg 98.7 kg    Labs & Radiologic Studies    CBC Recent Labs    04/19/23 1439 04/20/23 0256  WBC 8.4 7.7  HGB 16.5 16.2  HCT 49.2 47.9  MCV 91.3 91.4  PLT 216 186   Basic Metabolic Panel Recent Labs    40/98/11 1439 04/20/23 0502 04/21/23 0404  NA 138 139 139  K 4.1 4.0 3.8  CL 106 103 103  CO2 25 26 26   GLUCOSE 105* 110* 106*  BUN 21 16 15   CREATININE 1.10 1.29* 0.98  CALCIUM 8.9 8.9 9.0  MG 2.0  --   --    Liver Function Tests Recent Labs    04/19/23 1439  AST 30  ALT 29  ALKPHOS 48  BILITOT 0.8  PROT 6.5  ALBUMIN 3.6   No  results for input(s): "LIPASE", "AMYLASE" in the last 72 hours. High Sensitivity Troponin:   Recent Labs  Lab 04/19/23 1527 04/19/23 1806 04/20/23 0053 04/20/23 0502  TROPONINIHS 360* 411* 237* 145*    BNP Invalid input(s): "POCBNP" D-Dimer No results for input(s): "DDIMER" in the last 72 hours. Hemoglobin A1C Recent Labs    04/21/23 0404  HGBA1C 5.6   Fasting Lipid Panel Recent Labs    04/21/23 0404  CHOL 223*  HDL 33*  LDLCALC 156*  TRIG 172*  CHOLHDL 6.8   Thyroid Function Tests Recent Labs    04/19/23 2042  TSH 4.510*   _____________  ECHO TEE Result Date: 04/21/2023    TRANSESOPHOGEAL ECHO REPORT   Patient Name:   [name] Date of Exam: 04/21/2023 Medical Rec #:  [id number]       Height:       67.0 in Accession #:    [id number]      Weight:       217.7 lb Date of Birth:  Jul 26, 1955       BSA:          2.096 m Patient Age:    67 years        BP:           118/82 mmHg Patient Gender: M               HR:           95 bpm. Exam Location:  Inpatient Procedure: Cardiac Doppler, Color Doppler and Transesophageal Echo (Both             Spectral and Color Flow Doppler were utilized during procedure). Indications:     Atrial Fibrillation  History:         Patient has prior history of Echocardiogram examinations, most                  recent 04/20/2023. Cardiomyopathy and CHF, Arrythmias:Atrial                  Fibrillation, Atrial Flutter and Bradycardia; Risk                  Factors:Hypertension.  Sonographer:     Lucendia Herrlich RCS Referring Phys:  [id number] WNUU ADAMS Diagnosing Phys: Armanda Magic MD PROCEDURE: After discussion of the risks and benefits of a TEE, an informed consent was obtained from the patient. TEE procedure time was 10 minutes. The transesophogeal probe was passed without difficulty through the esophogus of the patient. Imaged were obtained with the patient in a left lateral decubitus position. Sedation performed by different physician. The patient was monitored while under deep sedation. Anesthestetic sedation was provided intravenously by Anesthesiology: 240mg  of Propofol, 100mg  of Lidocaine. Image quality was good. The patient's vital signs; including heart rate, blood pressure, and oxygen saturation; remained stable throughout the procedure. Supplementary images were obtained from transthoracic windows as indicated to answer the clinical question. The patient developed no complications during the procedure. A successful direct current cardioversion was performed at 200 joules with 1 attempt.  IMPRESSIONS  1. Left ventricular ejection fraction, by estimation, is 60 to 65%. The left ventricle has normal function. The left ventricle has no regional wall motion abnormalities.  2. Right ventricular systolic function is mildly reduced. The right ventricular size is normal.  3. S/P LA appendage oversewing with no evidence of residual appendage. No left atrial/left atrial appendage thrombus was detected.  4. The mitral valve is normal in  structure. Trivial mitral valve regurgitation. No evidence of mitral stenosis.  5. The  aortic valve is normal in structure. Aortic valve regurgitation is trivial. No aortic stenosis is present.  6. The inferior vena cava is normal in size with greater than 50% respiratory variability, suggesting right atrial pressure of 3 mmHg. Conclusion(s)/Recommendation(s): Normal biventricular function without evidence of hemodynamically significant valvular heart disease. No LA/LAA thrombus identified. Successful cardioversion performed with restoration of normal sinus rhythm. FINDINGS  Left Ventricle: Left ventricular ejection fraction, by estimation, is 60 to 65%. The left ventricle has normal function. The left ventricle has no regional wall motion abnormalities. The left ventricular internal cavity size was normal in size. There is  no left ventricular hypertrophy. Right Ventricle: The right ventricular size is normal. No increase in right ventricular wall thickness. Right ventricular systolic function is mildly reduced. Left Atrium: S/P LA appendage oversewing with no evidence of residual appendage. Left atrial size was normal in size. No left atrial/left atrial appendage thrombus was detected. Right Atrium: Right atrial size was normal in size. Pericardium: There is no evidence of pericardial effusion. Mitral Valve: The mitral valve is normal in structure. Trivial mitral valve regurgitation. No evidence of mitral valve stenosis. Tricuspid Valve: The tricuspid valve is normal in structure. Tricuspid valve regurgitation is trivial. No evidence of tricuspid stenosis. Aortic Valve: The aortic valve is normal in structure. Aortic valve regurgitation is trivial. No aortic stenosis is present. Pulmonic Valve: The pulmonic valve was normal in structure. Pulmonic valve regurgitation is not visualized. No evidence of pulmonic stenosis. Aorta: The aortic root is normal in size and structure. Venous: The inferior vena cava is normal in size with greater than 50% respiratory variability, suggesting right atrial pressure  of 3 mmHg. IAS/Shunts: The interatrial septum appears to be lipomatous. No atrial level shunt detected by color flow Doppler. Additional Comments: 3D imaging was not performed. Armanda Magic MD Electronically signed by Armanda Magic MD Signature Date/Time: 04/21/2023/11:50:43 AM    Final    EP STUDY Result Date: 04/21/2023 See surgical note for result.  ECHOCARDIOGRAM COMPLETE Result Date: 04/20/2023    ECHOCARDIOGRAM REPORT   Patient Name:   [name] Date of Exam: 04/20/2023 Medical Rec #:  [id number]       Height:       67.0 in Accession #:    [id number]      Weight:       225.0 lb Date of Birth:  12/09/1955       BSA:          2.126 m Patient Age:    67 years        BP:           118/82 mmHg Patient Gender: M               HR:           104 bpm. Exam Location:  Inpatient Procedure: 2D Echo, Strain Analysis, Color Doppler and Cardiac Doppler (Both            Spectral and Color Flow Doppler were utilized during procedure). Indications:    Atrial Fibrillation  History:        Patient has no prior history of Echocardiogram examinations,                 most recent 09/15/2015. CHF and Non-Ischemic Cardiomyopathy, CAD,                 Arrythmias:Atrial Fibrillation and Atrial  Flutter,                 Signs/Symptoms:Shortness of Breath and Dyspnea; Risk                 Factors:Sleep Apnea. Rheumatic Fever.  Sonographer:    Raeford Razor RDCS Referring Phys: Elmon Kirschner  Sonographer Comments: Technically difficult study due to poor echo windows. IMPRESSIONS  1. Left ventricular ejection fraction, by estimation, is 60 to 65%. The left ventricle has normal function. Left ventricular endocardial border not optimally defined to evaluate regional wall motion. There is mild left ventricular hypertrophy of the infero-lateral segment. Left ventricular diastolic parameters are indeterminate.  2. Right ventricular systolic function is mildly reduced. The right ventricular size is normal. Tricuspid regurgitation signal is  inadequate for assessing PA pressure.  3. The mitral valve is degenerative. Trivial mitral valve regurgitation. No evidence of mitral stenosis.  4. The aortic valve is tricuspid. Aortic valve regurgitation is not visualized. No aortic stenosis is present.  5. The inferior vena cava is normal in size with greater than 50% respiratory variability, suggesting right atrial pressure of 3 mmHg. FINDINGS  Left Ventricle: Left ventricular ejection fraction, by estimation, is 60 to 65%. The left ventricle has normal function. Left ventricular endocardial border not optimally defined to evaluate regional wall motion. Global longitudinal strain performed but  not reported based on interpreter judgement due to suboptimal tracking and is indeterminate. The left ventricular internal cavity size was normal in size. There is mild left ventricular hypertrophy of the infero-lateral segment. Left ventricular diastolic parameters are indeterminate. Right Ventricle: The right ventricular size is normal. No increase in right ventricular wall thickness. Right ventricular systolic function is mildly reduced. Tricuspid regurgitation signal is inadequate for assessing PA pressure. Left Atrium: Left atrial size was normal in size. Right Atrium: Right atrial size was normal in size. Pericardium: Trivial pericardial effusion is present. Presence of epicardial fat layer. Mitral Valve: The mitral valve is degenerative in appearance. Mild mitral annular calcification. Trivial mitral valve regurgitation. No evidence of mitral valve stenosis. Tricuspid Valve: The tricuspid valve is normal in structure. Tricuspid valve regurgitation is trivial. No evidence of tricuspid stenosis. Aortic Valve: The aortic valve is tricuspid. Aortic valve regurgitation is not visualized. No aortic stenosis is present. Aortic valve peak gradient measures 3.2 mmHg. Pulmonic Valve: The pulmonic valve was normal in structure. Pulmonic valve regurgitation is trivial. No  evidence of pulmonic stenosis. Aorta: The aortic root is normal in size and structure. Venous: The inferior vena cava is normal in size with greater than 50% respiratory variability, suggesting right atrial pressure of 3 mmHg. IAS/Shunts: The interatrial septum was not well visualized.  LEFT VENTRICLE PLAX 2D LVIDd:         5.70 cm LVIDs:         3.60 cm LV PW:         1.20 cm LV IVS:        1.00 cm LVOT diam:     2.20 cm LV SV:         31 LV SV Index:   15 LVOT Area:     3.80 cm  RIGHT VENTRICLE             IVC RV Basal diam:  3.50 cm     IVC diam: 2.50 cm RV Mid diam:    2.10 cm RV S prime:     12.50 cm/s LEFT ATRIUM  Index LA diam:        5.20 cm 2.45 cm/m LA Vol (A2C):   56.2 ml 26.43 ml/m LA Vol (A4C):   55.7 ml 26.20 ml/m LA Biplane Vol: 59.1 ml 27.80 ml/m  AORTIC VALVE AV Area (Vmax): 3.26 cm AV Vmax:        89.13 cm/s AV Peak Grad:   3.2 mmHg LVOT Vmax:      76.37 cm/s LVOT Vmean:     56.310 cm/s LVOT VTI:       0.081 m  AORTA Ao Root diam: 3.80 cm Ao Asc diam:  3.20 cm  SHUNTS Systemic VTI:  0.08 m Systemic Diam: 2.20 cm Weston Brass MD Electronically signed by Weston Brass MD Signature Date/Time: 04/20/2023/9:36:52 PM    Final    CT Angio Chest PE W and/or Wo Contrast Result Date: 04/19/2023 CLINICAL DATA:  Atrial fibrillation, fatigue, shortness of breath EXAM: CT ANGIOGRAPHY CHEST WITH CONTRAST TECHNIQUE: Multidetector CT imaging of the chest was performed using the standard protocol during bolus administration of intravenous contrast. Multiplanar CT image reconstructions and MIPs were obtained to evaluate the vascular anatomy. RADIATION DOSE REDUCTION: This exam was performed according to the departmental dose-optimization program which includes automated exposure control, adjustment of the mA and/or kV according to patient size and/or use of iterative reconstruction technique. CONTRAST:  75mL OMNIPAQUE IOHEXOL 350 MG/ML SOLN COMPARISON:  Chest radiograph dated 04/19/2023.  Cardiac CT dated 04/30/2015. FINDINGS: Cardiovascular: Satisfactory opacification the bilateral pulmonary arteries to the segmental level. No evidence of pulmonary embolism. Although not tailored for evaluation of the thoracic aorta, there is no evidence of thoracic aortic aneurysm or dissection. Cardiomegaly.  No pericardial effusion. Mild coronary atherosclerosis in the LAD and left circumflex. Mediastinum/Nodes: No suspicious mediastinal lymphadenopathy. Visualized thyroid is unremarkable. Lungs/Pleura: Lungs are essentially clear. Mild subpleural scarring in the lateral right lung. Mild linear scarring in the left lower lobe. No focal consolidation. No suspicious pulmonary nodules. 4 mm calcified granuloma in the right middle lobe (series 6/image 76), benign. No pleural effusion or pneumothorax. Upper Abdomen: Visualized upper abdomen is notable for cholelithiasis and a tiny hiatal hernia. Musculoskeletal: Degenerative changes of the visualized thoracolumbar spine. Review of the MIP images confirms the above findings. IMPRESSION: No evidence of pulmonary embolism. Cardiomegaly. No acute cardiopulmonary disease. Electronically Signed   By: Charline Bills M.D.   On: 04/19/2023 19:19   DG Chest Port 1 View Result Date: 04/19/2023 CLINICAL DATA:  Atrial fibrillation. Fatigue and shortness of breath. EXAM: PORTABLE CHEST 1 VIEW COMPARISON:  Chest radiographs 06/15/2015 FINDINGS: Cardiac silhouette is again moderately enlarged and mediastinal contours are within normal limits. Resolution of the prior tenting of the right hemidiaphragm. The lungs are clear. No pleural effusion or pneumothorax. No acute skeletal abnormality. IMPRESSION: Moderate cardiomegaly. No acute lung process. Electronically Signed   By: Neita Garnet M.D.   On: 04/19/2023 15:17   Disposition   Pt is being discharged home today in good condition.  Follow-up Plans & Appointments     Discharge Instructions     Diet - low sodium  heart healthy   Complete by: As directed    Increase activity slowly   Complete by: As directed         Discharge Medications   Allergies as of 04/21/2023       Reactions   Penicillins Other (See Comments)   ## SYNCOPE ## (Tolerates amoxicillin) 05/28/15-Pt interview, reaction was "fainted after PCN as a child" Has patient had a PCN  reaction causing immediate rash, facial/tongue/throat swelling, SOB or lightheadedness with hypotension: Yes Has patient had a PCN reaction causing severe rash involving mucus membranes or skin necrosis: No Has patient had a PCN reaction that required hospitalization No Has patient had a PCN reaction occurring within the last 10 years: No If all of the above answers are "NO   Zinacef [cefuroxime] Anaphylaxis   Patient reacted to 1 ml Test dose of Zinacef in OR.   Cefuroxime Axetil Rash   Experienced redness and reaction in OR        Medication List     STOP taking these medications    oxyCODONE-acetaminophen 5-325 MG tablet Commonly known as: PERCOCET/ROXICET       TAKE these medications    apixaban 5 MG Tabs tablet Commonly known as: ELIQUIS Take 1 tablet (5 mg total) by mouth 2 (two) times daily.   atorvastatin 40 MG tablet Commonly known as: LIPITOR Take 1 tablet (40 mg total) by mouth daily. Start taking on: April 22, 2023   metoprolol tartrate 25 MG tablet Commonly known as: LOPRESSOR Take 1 tablet (25 mg total) by mouth 2 (two) times daily.           Outstanding Labs/Studies    Duration of Discharge Encounter: APP Time: 17 minutes   Signed, Jonita Albee, PA-C 04/21/2023, 2:37 PM

## 2023-04-21 NOTE — Transfer of Care (Signed)
Immediate Anesthesia Transfer of Care Note  Patient: [name]  Procedure(s) Performed: TRANSESOPHAGEAL ECHOCARDIOGRAM CARDIOVERSION  Patient Location: Cath Lab  Anesthesia Type:General  Level of Consciousness: awake, alert , oriented, and patient cooperative  Airway & Oxygen Therapy: Patient Spontanous Breathing and Patient connected to nasal cannula oxygen  Post-op Assessment: Report given to RN and Post -op Vital signs reviewed and stable  Post vital signs: Reviewed and stable  Last Vitals:  Vitals Value Taken Time  BP    Temp    Pulse 49 04/21/23 0841  Resp 16 04/21/23 0841  SpO2 97 % 04/21/23 0841  Vitals shown include unfiled device data.  Last Pain:  Vitals:   04/21/23 0737  TempSrc: Temporal  PainSc:          Complications: There were no known notable events for this encounter.

## 2023-04-21 NOTE — Progress Notes (Signed)
Cardiology Progress Note  Patient ID: DEMETRIAS GOODBAR MRN: [id number] DOB: Jun 02, 1955 Date of Encounter: 04/21/2023 Primary Cardiologist: None  Subjective   Chief Complaint: None.   HPI: Heart rates are well-controlled.  Plan for TEE cardioversion today.  Denies chest pain or trouble breathing  ROS:  All other ROS reviewed and negative. Pertinent positives noted in the HPI.     Telemetry  Overnight telemetry shows A-fib 90 to 100 bpm, which I personally reviewed.   Physical Exam   Vitals:   04/20/23 1741 04/20/23 2008 04/21/23 0520 04/21/23 0737  BP: 114/79 109/71 121/83 115/81  Pulse: 69 75 66 66  Resp:  18  18  Temp:  97.6 F (36.4 C) 98.1 F (36.7 C) 97.8 F (36.6 C)  TempSrc:  Oral Oral Temporal  SpO2:   94% 95%  Weight:      Height:        Intake/Output Summary (Last 24 hours) at 04/21/2023 0757 Last data filed at 04/21/2023 0311 Gross per 24 hour  Intake 181.38 ml  Output --  Net 181.38 ml       04/20/2023    3:15 PM 04/19/2023    2:38 PM 02/12/2023    5:50 PM  Last 3 Weights  Weight (lbs) 217 lb 11.2 oz 225 lb 219 lb 5.7 oz  Weight (kg) 98.748 kg 102.059 kg 99.5 kg    Body mass index is 34.1 kg/m.  General: Well nourished, well developed, in no acute distress Head: Atraumatic, normal size  Eyes: PEERLA, EOMI  Neck: Supple, no JVD Endocrine: No thryomegaly Cardiac: Normal S1, S2; irregular rhythm, no murmurs Lungs: Clear to auscultation bilaterally, no wheezing, rhonchi or rales  Abd: Soft, nontender, no hepatomegaly  Ext: No edema, pulses 2+ Musculoskeletal: No deformities, BUE and BLE strength normal and equal Skin: Warm and dry, no rashes   Neuro: Alert and oriented to person, place, time, and situation, CNII-XII grossly intact, no focal deficits  Psych: Normal mood and affect   Cardiac Studies  TTE 04/20/2023  1. Left ventricular ejection fraction, by estimation, is 60 to 65%. The  left ventricle has normal function. Left ventricular  endocardial border  not optimally defined to evaluate regional wall motion. There is mild left  ventricular hypertrophy of the  infero-lateral segment. Left ventricular diastolic parameters are  indeterminate.   2. Right ventricular systolic function is mildly reduced. The right  ventricular size is normal. Tricuspid regurgitation signal is inadequate  for assessing PA pressure.   3. The mitral valve is degenerative. Trivial mitral valve regurgitation.  No evidence of mitral stenosis.   4. The aortic valve is tricuspid. Aortic valve regurgitation is not  visualized. No aortic stenosis is present.   5. The inferior vena cava is normal in size with greater than 50%  respiratory variability, suggesting right atrial pressure of 3 mmHg.   Left heart cath 03/25/2015 30% mid LAD 20% ostial LAD  Patient Profile  DANIE DIEHL is a 68 y.o. male with history of persistent atrial fibrillation status post surgical maze with surgical left atrial appendage closure in 2017, nonischemic cardiomyopathy with recovery of ejection fraction, nonobstructive CAD admitted on 04/19/2023 for atrial flutter.   Assessment & Plan   # Atrial flutter, atypical -Suspect atypical flutter in the setting of prior surgical maze.  Had left atrial appendage surgical closure as well. -On diltiazem drip. -Plan for TEE/cardioversion today. -Continue Eliquis 5 mg twice daily. -Echo is normal. -Plan to likely discharge later today if  he does well on metoprolol. -TSH mildly elevated.  Free T4 is normal.  # Elevated troponin, demand ischemia -Echo was normal.  Secondary to A-fib.  Had a left heart cath several years ago with nonobstructive disease.  Reports no chest pain. -Can consider repeat outpatient stress test but not entirely necessary as symptoms are likely related to A-fib. -Continue high intensity statin.  He apparently stopped taking his medications.  No need for aspirin at this time.  He is on Eliquis.  #  History of systolic heart failure -History of reduced EF related to arrhythmia. -EF normal here. -I do not believe we need to titrate GDMT.  # FEN -Code full -Diet n.p.o. for TEE/cardioversion -DVT PPx: Eliquis -Disposition: Anticipate discharge later today      For questions or updates, please contact [facility] Please consult [email or web address] for contact info under      Signed, Gerri Spore T. Flora Lipps, MD, [facility]  [facility]  04/21/2023 7:57 AM

## 2023-04-21 NOTE — Telephone Encounter (Signed)
Patient Product/process development scientist completed.    The patient is insured through [employer] [facility]. Patient has [employer], may use a copay card, and/or apply for patient assistance if available.    Ran test claim for Eliquis 5 mg and the current 30 day co-pay is $90.00.   This test claim was processed through [facility]- copay amounts may vary at other pharmacies due to pharmacy/plan contracts, or as the patient moves through the different stages of their insurance plan.     Roland Earl, CPHT Pharmacy Technician III Certified Patient Advocate [facility] Patient Advocate Team Direct Number: [phone]  Fax: [phone]

## 2023-04-21 NOTE — Interval H&P Note (Signed)
History and Physical Interval Note:  04/21/2023 7:44 AM  [name]  has presented today for surgery, with the diagnosis of afib.  The various methods of treatment have been discussed with the patient and family. After consideration of risks, benefits and other options for treatment, the patient has consented to  Procedure(s): TRANSESOPHAGEAL ECHOCARDIOGRAM (N/A) CARDIOVERSION (N/A) as a surgical intervention.  The patient's history has been reviewed, patient examined, no change in status, stable for surgery.  I have reviewed the patient's chart and labs.  Questions were answered to the patient's satisfaction.     Armanda Magic

## 2023-04-21 NOTE — Progress Notes (Signed)
Echocardiogram Echocardiogram Transesophageal has been performed.  Lucendia Herrlich 04/21/2023, 8:28 AM

## 2023-04-29 NOTE — Progress Notes (Unsigned)
 Marland Kitchen

## 2023-05-03 ENCOUNTER — Encounter: Payer: Self-pay | Admitting: Cardiology

## 2023-05-03 ENCOUNTER — Ambulatory Visit: Payer: Managed Care, Other (non HMO) | Attending: Cardiology | Admitting: Cardiology

## 2023-05-03 VITALS — BP 130/70 | HR 54 | Ht 67.0 in | Wt 218.0 lb

## 2023-05-03 DIAGNOSIS — I5032 Chronic diastolic (congestive) heart failure: Secondary | ICD-10-CM

## 2023-05-03 DIAGNOSIS — I4819 Other persistent atrial fibrillation: Secondary | ICD-10-CM | POA: Diagnosis not present

## 2023-05-03 DIAGNOSIS — I2510 Atherosclerotic heart disease of native coronary artery without angina pectoris: Secondary | ICD-10-CM

## 2023-05-03 DIAGNOSIS — I484 Atypical atrial flutter: Secondary | ICD-10-CM | POA: Diagnosis not present

## 2023-05-03 DIAGNOSIS — E785 Hyperlipidemia, unspecified: Secondary | ICD-10-CM | POA: Diagnosis not present

## 2023-05-03 DIAGNOSIS — I10 Essential (primary) hypertension: Secondary | ICD-10-CM | POA: Diagnosis not present

## 2023-05-03 MED ORDER — METOPROLOL TARTRATE 25 MG PO TABS: 25.0000 mg | ORAL_TABLET | Freq: Two times a day (BID) | ORAL | 3 refills | Status: AC

## 2023-05-03 MED ORDER — ATORVASTATIN CALCIUM 40 MG PO TABS: 40.0000 mg | ORAL_TABLET | Freq: Every day | ORAL | 3 refills | Status: DC

## 2023-05-03 MED ORDER — APIXABAN 5 MG PO TABS: 5.0000 mg | ORAL_TABLET | Freq: Two times a day (BID) | ORAL | 11 refills | Status: DC

## 2023-05-03 NOTE — Patient Instructions (Addendum)
 Medication Instructions:  No changes *If you need a refill on your cardiac medications before your next appointment, please call your pharmacy*  Lab Work: In 2 month we are going to need a Lipid panel and LFT's If you have labs (blood work) drawn today and your tests are completely normal, you will receive your results only by: MyChart Message (if you have MyChart) OR A paper copy in the mail If you have any lab test that is abnormal or we need to change your treatment, we will call you to review the results.  Testing/Procedures: No testing  Follow-Up: At [facility], you and your health needs are our priority.  As part of our continuing mission to provide you with exceptional heart care, we have created designated Provider Care Teams.  These Care Teams include your primary Cardiologist (physician) and Advanced Practice Providers (APPs -  Physician Assistants and Nurse Practitioners) who all work together to provide you with the care you need, when you need it.  We recommend signing up for the patient portal called "MyChart".  Sign up information is provided on this After Visit Summary.  MyChart is used to connect with patients for Virtual Visits (Telemedicine).  Patients are able to view lab/test results, encounter notes, upcoming appointments, etc.  Non-urgent messages can be sent to your provider as well.   To learn more about what you can do with MyChart, go to [email or web address].    Your next appointment:   6 month(s)  Provider:   Reatha Harps, MD

## 2023-07-05 LAB — HEPATIC FUNCTION PANEL
ALT: 20 IU/L (ref 0–44)
AST: 21 IU/L (ref 0–40)
Albumin: 4.3 g/dL (ref 3.9–4.9)
Alkaline Phosphatase: 66 IU/L (ref 44–121)
Bilirubin Total: 0.7 mg/dL (ref 0.0–1.2)
Bilirubin, Direct: 0.22 mg/dL (ref 0.00–0.40)
Total Protein: 6.6 g/dL (ref 6.0–8.5)

## 2023-07-05 LAB — LIPID PANEL
Chol/HDL Ratio: 4.8 ratio (ref 0.0–5.0)
Cholesterol, Total: 189 mg/dL (ref 100–199)
HDL: 39 mg/dL — ABNORMAL LOW (ref >39)
LDL Chol Calc (NIH): 126 mg/dL — ABNORMAL HIGH (ref 0–99)
Triglycerides: 135 mg/dL (ref 0–149)
VLDL Cholesterol Cal: 24 mg/dL (ref 5–40)

## 2023-07-12 ENCOUNTER — Ambulatory Visit: Payer: Self-pay | Admitting: Cardiology

## 2023-07-12 DIAGNOSIS — I484 Atypical atrial flutter: Secondary | ICD-10-CM

## 2023-07-12 DIAGNOSIS — E785 Hyperlipidemia, unspecified: Secondary | ICD-10-CM

## 2023-07-12 DIAGNOSIS — I10 Essential (primary) hypertension: Secondary | ICD-10-CM

## 2023-07-13 NOTE — Telephone Encounter (Signed)
-----   Message from Debria Fang sent at 07/12/2023  1:56 PM EDT ----- Please tell patient that his LDL (bad cholesterol) is elevated at 126.  Ideally, this would at least be below 100.  Liver function normal.  Please have him increase his Lipitor to 80 mg daily.  Repeat LFTs, lipid panel in 8 weeks

## 2023-07-13 NOTE — Telephone Encounter (Signed)
 Called patient advised of below they verbalized understanding Patient is not taking the lipitor 40 mg at all. Advised patient to start taking the medication and get labs in 8 weeks drawn and will see how he is doing  Patient stated that he try taking it but is unsure if he will continue taking it but he will still get it done in 8 weeks if he does not.

## 2023-11-09 ENCOUNTER — Encounter: Payer: Self-pay | Admitting: Cardiovascular Disease

## 2024-01-29 ENCOUNTER — Encounter: Payer: Self-pay | Admitting: Podiatry

## 2024-01-29 ENCOUNTER — Ambulatory Visit: Payer: Self-pay | Admitting: Podiatry

## 2024-01-29 DIAGNOSIS — Q828 Other specified congenital malformations of skin: Secondary | ICD-10-CM | POA: Diagnosis not present

## 2024-01-29 DIAGNOSIS — M7752 Other enthesopathy of left foot: Secondary | ICD-10-CM | POA: Diagnosis not present

## 2024-01-29 DIAGNOSIS — M21622 Bunionette of left foot: Secondary | ICD-10-CM

## 2024-01-29 MED ORDER — TRIAMCINOLONE ACETONIDE 10 MG/ML IJ SUSP
10.0000 mg | Freq: Once | INTRAMUSCULAR | Status: AC
  Administered 2024-01-29: 10 mg via INTRA_ARTICULAR

## 2024-01-30 NOTE — Progress Notes (Unsigned)
 This

## 2024-02-08 ENCOUNTER — Ambulatory Visit: Admitting: Podiatry

## 2024-02-08 ENCOUNTER — Encounter: Payer: Self-pay | Admitting: Podiatry

## 2024-02-08 DIAGNOSIS — M21622 Bunionette of left foot: Secondary | ICD-10-CM

## 2024-02-09 NOTE — Progress Notes (Signed)
 Subjective:   Patient ID: [name], male   DOB: 68 y.o.   MRN: [id number]   HPI Patient presents with a painful lesion plantar aspect left fifth metatarsal and states he did not realize he at this   ROS      Objective:  Physical Exam  Neurovascular status intact with patient having responded well to my previous treatment with a small lesion plantar aspect left fifth metatarsal very tender with a lucent core     Assessment:  Tailor's bunion deformity with small lesion     Plan:  Reviewed the possibility for metatarsal head resection which I think might be necessary in this condition and at this point I did do a courtesy sterile debridement of the lesion
